# Patient Record
Sex: Female | Born: 1955 | Race: White | Hispanic: No | Marital: Married | Smoking: Former smoker
Health system: Southern US, Community
[De-identification: ages and names within clinical notes are randomized; demographics above are authoritative.]

## PROBLEM LIST (undated history)

## (undated) DIAGNOSIS — M810 Age-related osteoporosis without current pathological fracture: Secondary | ICD-10-CM

## (undated) DIAGNOSIS — E7800 Pure hypercholesterolemia, unspecified: Secondary | ICD-10-CM

## (undated) DIAGNOSIS — R0789 Other chest pain: Secondary | ICD-10-CM

## (undated) DIAGNOSIS — I10 Essential (primary) hypertension: Secondary | ICD-10-CM

## (undated) DIAGNOSIS — L280 Lichen simplex chronicus: Secondary | ICD-10-CM

## (undated) DIAGNOSIS — K219 Gastro-esophageal reflux disease without esophagitis: Secondary | ICD-10-CM

## (undated) DIAGNOSIS — M2550 Pain in unspecified joint: Secondary | ICD-10-CM

## (undated) HISTORY — DX: Lichen simplex chronicus: L28.0

## (undated) HISTORY — DX: Age-related osteoporosis without current pathological fracture: M81.0

## (undated) HISTORY — PX: APPENDECTOMY: SHX54

## (undated) HISTORY — PX: TONSILLECTOMY: SUR1361

## (undated) HISTORY — DX: Pain in unspecified joint: M25.50

## (undated) HISTORY — PX: CHOLECYSTECTOMY: SHX55

## (undated) HISTORY — DX: Other chest pain: R07.89

---

## 2003-08-12 ENCOUNTER — Emergency Department (HOSPITAL_COMMUNITY): Admission: EM | Admit: 2003-08-12 | Discharge: 2003-08-12 | Payer: Self-pay | Admitting: Emergency Medicine

## 2003-08-20 ENCOUNTER — Ambulatory Visit (HOSPITAL_COMMUNITY): Admission: RE | Admit: 2003-08-20 | Discharge: 2003-08-20 | Payer: Self-pay | Admitting: Internal Medicine

## 2005-02-23 ENCOUNTER — Emergency Department (HOSPITAL_COMMUNITY): Admission: EM | Admit: 2005-02-23 | Discharge: 2005-02-23 | Payer: Self-pay | Admitting: Emergency Medicine

## 2005-12-07 ENCOUNTER — Other Ambulatory Visit: Admission: RE | Admit: 2005-12-07 | Discharge: 2005-12-07 | Payer: Self-pay | Admitting: Emergency Medicine

## 2007-03-22 ENCOUNTER — Ambulatory Visit (HOSPITAL_COMMUNITY): Admission: RE | Admit: 2007-03-22 | Discharge: 2007-03-22 | Payer: Self-pay | Admitting: Internal Medicine

## 2007-07-28 ENCOUNTER — Emergency Department (HOSPITAL_COMMUNITY): Admission: EM | Admit: 2007-07-28 | Discharge: 2007-07-28 | Payer: Self-pay | Admitting: Emergency Medicine

## 2007-09-06 ENCOUNTER — Ambulatory Visit (HOSPITAL_COMMUNITY): Admission: RE | Admit: 2007-09-06 | Discharge: 2007-09-06 | Payer: Self-pay | Admitting: Internal Medicine

## 2007-12-23 ENCOUNTER — Other Ambulatory Visit: Admission: RE | Admit: 2007-12-23 | Discharge: 2007-12-23 | Payer: Self-pay | Admitting: Internal Medicine

## 2008-06-03 ENCOUNTER — Emergency Department (HOSPITAL_COMMUNITY): Admission: EM | Admit: 2008-06-03 | Discharge: 2008-06-03 | Payer: Self-pay | Admitting: Emergency Medicine

## 2008-12-15 ENCOUNTER — Emergency Department (HOSPITAL_COMMUNITY): Admission: EM | Admit: 2008-12-15 | Discharge: 2008-12-15 | Payer: Self-pay | Admitting: Emergency Medicine

## 2008-12-16 ENCOUNTER — Ambulatory Visit: Payer: Self-pay | Admitting: Internal Medicine

## 2008-12-16 ENCOUNTER — Inpatient Hospital Stay (HOSPITAL_COMMUNITY): Admission: EM | Admit: 2008-12-16 | Discharge: 2008-12-18 | Payer: Self-pay | Admitting: Emergency Medicine

## 2008-12-22 ENCOUNTER — Emergency Department (HOSPITAL_COMMUNITY): Admission: EM | Admit: 2008-12-22 | Discharge: 2008-12-22 | Payer: Self-pay | Admitting: Emergency Medicine

## 2008-12-25 ENCOUNTER — Encounter: Payer: Self-pay | Admitting: Physician Assistant

## 2008-12-29 ENCOUNTER — Ambulatory Visit: Payer: Self-pay | Admitting: Physician Assistant

## 2008-12-29 DIAGNOSIS — I10 Essential (primary) hypertension: Secondary | ICD-10-CM | POA: Insufficient documentation

## 2008-12-29 DIAGNOSIS — K219 Gastro-esophageal reflux disease without esophagitis: Secondary | ICD-10-CM | POA: Insufficient documentation

## 2008-12-29 DIAGNOSIS — E785 Hyperlipidemia, unspecified: Secondary | ICD-10-CM | POA: Insufficient documentation

## 2008-12-29 DIAGNOSIS — Z87891 Personal history of nicotine dependence: Secondary | ICD-10-CM | POA: Insufficient documentation

## 2009-01-01 ENCOUNTER — Ambulatory Visit (HOSPITAL_COMMUNITY): Admission: RE | Admit: 2009-01-01 | Discharge: 2009-01-01 | Payer: Self-pay | Admitting: Internal Medicine

## 2009-01-06 ENCOUNTER — Encounter: Payer: Self-pay | Admitting: Physician Assistant

## 2009-01-08 ENCOUNTER — Telehealth: Payer: Self-pay | Admitting: Physician Assistant

## 2009-02-12 ENCOUNTER — Encounter: Payer: Self-pay | Admitting: Physician Assistant

## 2009-02-12 DIAGNOSIS — Z8669 Personal history of other diseases of the nervous system and sense organs: Secondary | ICD-10-CM | POA: Insufficient documentation

## 2009-02-13 ENCOUNTER — Emergency Department (HOSPITAL_COMMUNITY): Admission: EM | Admit: 2009-02-13 | Discharge: 2009-02-13 | Payer: Self-pay | Admitting: Family Medicine

## 2009-02-15 ENCOUNTER — Encounter: Payer: Self-pay | Admitting: Physician Assistant

## 2009-02-17 ENCOUNTER — Encounter: Payer: Self-pay | Admitting: Physician Assistant

## 2009-02-17 ENCOUNTER — Ambulatory Visit: Payer: Self-pay | Admitting: Internal Medicine

## 2009-02-19 ENCOUNTER — Encounter: Payer: Self-pay | Admitting: Physician Assistant

## 2009-02-22 LAB — CONVERTED CEMR LAB
ALT: 33 units/L (ref 0–35)
AST: 22 units/L (ref 0–37)
BUN: 6 mg/dL (ref 6–23)
Cholesterol: 189 mg/dL (ref 0–200)
Creatinine, Ser: 0.81 mg/dL (ref 0.40–1.20)
HDL: 57 mg/dL (ref >39)
Total Bilirubin: 0.7 mg/dL (ref 0.3–1.2)
Total CHOL/HDL Ratio: 3.3
VLDL: 32 mg/dL (ref 0–40)
Vit D, 25-Hydroxy: 16 ng/mL — ABNORMAL LOW (ref 30–89)

## 2009-02-23 ENCOUNTER — Telehealth: Payer: Self-pay | Admitting: Physician Assistant

## 2009-02-23 ENCOUNTER — Ambulatory Visit: Payer: Self-pay | Admitting: Physician Assistant

## 2009-02-23 DIAGNOSIS — J309 Allergic rhinitis, unspecified: Secondary | ICD-10-CM | POA: Insufficient documentation

## 2009-02-23 DIAGNOSIS — K589 Irritable bowel syndrome without diarrhea: Secondary | ICD-10-CM | POA: Insufficient documentation

## 2009-02-23 DIAGNOSIS — E559 Vitamin D deficiency, unspecified: Secondary | ICD-10-CM | POA: Insufficient documentation

## 2009-02-23 DIAGNOSIS — F32A Depression, unspecified: Secondary | ICD-10-CM | POA: Insufficient documentation

## 2009-02-23 DIAGNOSIS — F329 Major depressive disorder, single episode, unspecified: Secondary | ICD-10-CM | POA: Insufficient documentation

## 2009-02-23 DIAGNOSIS — K5900 Constipation, unspecified: Secondary | ICD-10-CM | POA: Insufficient documentation

## 2009-02-23 DIAGNOSIS — F3289 Other specified depressive episodes: Secondary | ICD-10-CM | POA: Insufficient documentation

## 2009-02-25 ENCOUNTER — Ambulatory Visit: Payer: Self-pay | Admitting: *Deleted

## 2009-03-03 ENCOUNTER — Telehealth: Payer: Self-pay | Admitting: Physician Assistant

## 2009-03-08 ENCOUNTER — Encounter: Payer: Self-pay | Admitting: Physician Assistant

## 2009-03-09 ENCOUNTER — Telehealth: Payer: Self-pay | Admitting: Physician Assistant

## 2009-03-10 ENCOUNTER — Ambulatory Visit: Payer: Self-pay | Admitting: Physician Assistant

## 2009-03-10 LAB — CONVERTED CEMR LAB
BUN: 9 mg/dL (ref 6–23)
CO2: 25 meq/L (ref 19–32)
Calcium: 9.5 mg/dL (ref 8.4–10.5)
Chloride: 97 meq/L (ref 96–112)
Creatinine, Ser: 1.03 mg/dL (ref 0.40–1.20)

## 2009-03-11 ENCOUNTER — Encounter: Payer: Self-pay | Admitting: Physician Assistant

## 2009-03-15 ENCOUNTER — Encounter: Admission: RE | Admit: 2009-03-15 | Discharge: 2009-05-12 | Payer: Self-pay | Admitting: Physician Assistant

## 2009-03-15 ENCOUNTER — Encounter: Payer: Self-pay | Admitting: Physician Assistant

## 2009-03-29 ENCOUNTER — Ambulatory Visit: Payer: Self-pay | Admitting: Physician Assistant

## 2009-03-29 DIAGNOSIS — L570 Actinic keratosis: Secondary | ICD-10-CM | POA: Insufficient documentation

## 2009-03-29 LAB — CONVERTED CEMR LAB
Cholesterol, target level: 200 mg/dL
HDL goal, serum: 40 mg/dL
LDL Goal: 160 mg/dL

## 2009-04-12 ENCOUNTER — Telehealth: Payer: Self-pay | Admitting: Physician Assistant

## 2009-04-14 ENCOUNTER — Ambulatory Visit (HOSPITAL_COMMUNITY): Admission: RE | Admit: 2009-04-14 | Discharge: 2009-04-14 | Payer: Self-pay | Admitting: Gastroenterology

## 2009-04-16 ENCOUNTER — Encounter: Payer: Self-pay | Admitting: Physician Assistant

## 2009-04-20 ENCOUNTER — Ambulatory Visit: Payer: Self-pay | Admitting: Physician Assistant

## 2009-05-22 ENCOUNTER — Emergency Department (HOSPITAL_COMMUNITY): Admission: EM | Admit: 2009-05-22 | Discharge: 2009-05-22 | Payer: Self-pay | Admitting: Family Medicine

## 2009-05-26 ENCOUNTER — Telehealth: Payer: Self-pay | Admitting: Physician Assistant

## 2009-05-27 ENCOUNTER — Ambulatory Visit: Payer: Self-pay | Admitting: Physician Assistant

## 2009-05-27 DIAGNOSIS — J209 Acute bronchitis, unspecified: Secondary | ICD-10-CM | POA: Insufficient documentation

## 2009-06-01 ENCOUNTER — Telehealth: Payer: Self-pay | Admitting: Physician Assistant

## 2009-06-07 ENCOUNTER — Encounter: Payer: Self-pay | Admitting: Physician Assistant

## 2009-06-10 ENCOUNTER — Ambulatory Visit: Payer: Self-pay | Admitting: Physician Assistant

## 2009-06-10 DIAGNOSIS — R635 Abnormal weight gain: Secondary | ICD-10-CM | POA: Insufficient documentation

## 2009-06-10 DIAGNOSIS — J0110 Acute frontal sinusitis, unspecified: Secondary | ICD-10-CM | POA: Insufficient documentation

## 2009-06-11 ENCOUNTER — Telehealth: Payer: Self-pay | Admitting: Physician Assistant

## 2009-06-11 LAB — CONVERTED CEMR LAB
ALT: 14 units/L (ref 0–35)
BUN: 5 mg/dL — ABNORMAL LOW (ref 6–23)
CO2: 23 meq/L (ref 19–32)
Calcium: 9.7 mg/dL (ref 8.4–10.5)
Chloride: 97 meq/L (ref 96–112)
Creatinine, Ser: 0.95 mg/dL (ref 0.40–1.20)
Glucose, Bld: 107 mg/dL — ABNORMAL HIGH (ref 70–99)
TSH: 1.693 microintl units/mL (ref 0.350–4.500)
Total Bilirubin: 0.3 mg/dL (ref 0.3–1.2)
Vit D, 25-Hydroxy: 17 ng/mL — ABNORMAL LOW (ref 30–89)

## 2009-07-21 ENCOUNTER — Ambulatory Visit: Payer: Self-pay | Admitting: Physician Assistant

## 2009-07-21 DIAGNOSIS — R609 Edema, unspecified: Secondary | ICD-10-CM | POA: Insufficient documentation

## 2009-07-21 DIAGNOSIS — G4700 Insomnia, unspecified: Secondary | ICD-10-CM | POA: Insufficient documentation

## 2009-07-23 ENCOUNTER — Encounter: Payer: Self-pay | Admitting: Physician Assistant

## 2009-07-26 ENCOUNTER — Encounter: Payer: Self-pay | Admitting: Physician Assistant

## 2009-07-27 ENCOUNTER — Telehealth: Payer: Self-pay | Admitting: Physician Assistant

## 2009-08-04 ENCOUNTER — Ambulatory Visit: Payer: Self-pay | Admitting: Physician Assistant

## 2009-08-04 LAB — CONVERTED CEMR LAB
BUN: 6 mg/dL (ref 6–23)
Chloride: 96 meq/L (ref 96–112)
Potassium: 4.5 meq/L (ref 3.5–5.3)

## 2009-08-05 ENCOUNTER — Encounter: Payer: Self-pay | Admitting: Physician Assistant

## 2009-09-23 ENCOUNTER — Ambulatory Visit: Payer: Self-pay | Admitting: Physician Assistant

## 2009-09-23 DIAGNOSIS — J069 Acute upper respiratory infection, unspecified: Secondary | ICD-10-CM | POA: Insufficient documentation

## 2009-09-23 DIAGNOSIS — R0602 Shortness of breath: Secondary | ICD-10-CM | POA: Insufficient documentation

## 2009-09-23 DIAGNOSIS — L219 Seborrheic dermatitis, unspecified: Secondary | ICD-10-CM | POA: Insufficient documentation

## 2009-09-24 LAB — CONVERTED CEMR LAB
ALT: 28 units/L (ref 0–35)
Albumin: 4.3 g/dL (ref 3.5–5.2)
CO2: 26 meq/L (ref 19–32)
Calcium: 9.3 mg/dL (ref 8.4–10.5)
Chloride: 94 meq/L — ABNORMAL LOW (ref 96–112)
Glucose, Bld: 80 mg/dL (ref 70–99)
Hemoglobin: 12.3 g/dL (ref 12.0–15.0)
Lymphs Abs: 2.1 10*3/uL (ref 0.7–4.0)
MCV: 84 fL (ref 78.0–100.0)
Monocytes Absolute: 0.7 10*3/uL (ref 0.1–1.0)
Monocytes Relative: 9 % (ref 3–12)
Neutro Abs: 5 10*3/uL (ref 1.7–7.7)
Neutrophils Relative %: 63 % (ref 43–77)
Pro B Natriuretic peptide (BNP): 30.8 pg/mL (ref 0.0–100.0)
RBC: 4.37 M/uL (ref 3.87–5.11)
Sodium: 132 meq/L — ABNORMAL LOW (ref 135–145)
Total Protein: 6.7 g/dL (ref 6.0–8.3)
WBC: 8 10*3/uL (ref 4.0–10.5)

## 2009-09-27 ENCOUNTER — Ambulatory Visit: Payer: Self-pay | Admitting: Physician Assistant

## 2009-09-30 LAB — CONVERTED CEMR LAB
Calcium: 9.2 mg/dL (ref 8.4–10.5)
Glucose, Bld: 107 mg/dL — ABNORMAL HIGH (ref 70–99)
Osmolality, Ur: 269 mOsm/kg — ABNORMAL LOW (ref 390–1090)
Osmolality: 278 mOsm/kg (ref 275–300)
Sodium, Ur: 60 meq/L
Sodium: 136 meq/L (ref 135–145)

## 2009-10-01 ENCOUNTER — Encounter: Payer: Self-pay | Admitting: Physician Assistant

## 2009-10-03 DIAGNOSIS — Z862 Personal history of diseases of the blood and blood-forming organs and certain disorders involving the immune mechanism: Secondary | ICD-10-CM | POA: Insufficient documentation

## 2009-10-03 DIAGNOSIS — Z8639 Personal history of other endocrine, nutritional and metabolic disease: Secondary | ICD-10-CM

## 2009-10-06 ENCOUNTER — Ambulatory Visit (HOSPITAL_COMMUNITY): Admission: RE | Admit: 2009-10-06 | Discharge: 2009-10-06 | Payer: Self-pay | Admitting: Internal Medicine

## 2009-10-06 ENCOUNTER — Encounter (INDEPENDENT_AMBULATORY_CARE_PROVIDER_SITE_OTHER): Payer: Self-pay | Admitting: Internal Medicine

## 2009-10-06 ENCOUNTER — Ambulatory Visit: Payer: Self-pay | Admitting: Internal Medicine

## 2009-10-06 ENCOUNTER — Encounter: Payer: Self-pay | Admitting: Physician Assistant

## 2009-10-06 LAB — CONVERTED CEMR LAB
Nitrite: NEGATIVE
Specific Gravity, Urine: <1.005
WBC Urine, dipstick: NEGATIVE
pH: 6

## 2009-10-14 ENCOUNTER — Ambulatory Visit (HOSPITAL_COMMUNITY): Admission: RE | Admit: 2009-10-14 | Discharge: 2009-10-14 | Payer: Self-pay | Admitting: Internal Medicine

## 2009-10-20 ENCOUNTER — Encounter: Payer: Self-pay | Admitting: Physician Assistant

## 2009-10-20 ENCOUNTER — Telehealth: Payer: Self-pay | Admitting: Physician Assistant

## 2009-10-20 DIAGNOSIS — K8020 Calculus of gallbladder without cholecystitis without obstruction: Secondary | ICD-10-CM | POA: Insufficient documentation

## 2009-10-22 ENCOUNTER — Telehealth: Payer: Self-pay | Admitting: Physician Assistant

## 2009-11-02 ENCOUNTER — Encounter (INDEPENDENT_AMBULATORY_CARE_PROVIDER_SITE_OTHER): Payer: Self-pay | Admitting: General Surgery

## 2009-11-02 ENCOUNTER — Ambulatory Visit (HOSPITAL_COMMUNITY): Admission: RE | Admit: 2009-11-02 | Discharge: 2009-11-03 | Payer: Self-pay | Admitting: General Surgery

## 2009-11-04 ENCOUNTER — Telehealth: Payer: Self-pay | Admitting: Physician Assistant

## 2009-11-04 ENCOUNTER — Emergency Department (HOSPITAL_COMMUNITY): Admission: EM | Admit: 2009-11-04 | Discharge: 2009-11-04 | Payer: Self-pay | Admitting: Family Medicine

## 2009-12-08 ENCOUNTER — Ambulatory Visit: Payer: Self-pay | Admitting: Physician Assistant

## 2009-12-08 DIAGNOSIS — T753XXA Motion sickness, initial encounter: Secondary | ICD-10-CM | POA: Insufficient documentation

## 2009-12-08 DIAGNOSIS — Z9089 Acquired absence of other organs: Secondary | ICD-10-CM | POA: Insufficient documentation

## 2009-12-23 ENCOUNTER — Ambulatory Visit: Payer: Self-pay | Admitting: Physician Assistant

## 2009-12-23 LAB — CONVERTED CEMR LAB: Total CHOL/HDL Ratio: 4.6

## 2009-12-24 ENCOUNTER — Telehealth: Payer: Self-pay | Admitting: Physician Assistant

## 2010-01-07 ENCOUNTER — Telehealth: Payer: Self-pay | Admitting: Physician Assistant

## 2010-01-26 ENCOUNTER — Ambulatory Visit: Payer: Self-pay | Admitting: Internal Medicine

## 2010-01-26 ENCOUNTER — Telehealth: Payer: Self-pay | Admitting: Physician Assistant

## 2010-01-26 ENCOUNTER — Encounter: Payer: Self-pay | Admitting: Physician Assistant

## 2010-01-26 DIAGNOSIS — R059 Cough, unspecified: Secondary | ICD-10-CM | POA: Insufficient documentation

## 2010-01-26 DIAGNOSIS — R1110 Vomiting, unspecified: Secondary | ICD-10-CM | POA: Insufficient documentation

## 2010-01-26 DIAGNOSIS — R112 Nausea with vomiting, unspecified: Secondary | ICD-10-CM | POA: Insufficient documentation

## 2010-01-26 DIAGNOSIS — R21 Rash and other nonspecific skin eruption: Secondary | ICD-10-CM | POA: Insufficient documentation

## 2010-01-26 DIAGNOSIS — R05 Cough: Secondary | ICD-10-CM | POA: Insufficient documentation

## 2010-01-26 LAB — CONVERTED CEMR LAB
Eosinophils Absolute: 0.1 10*3/uL (ref 0.0–0.7)
Lymphs Abs: 1.6 10*3/uL (ref 0.7–4.0)
MCV: 84.3 fL (ref 78.0–100.0)
Monocytes Relative: 9 % (ref 3–12)
Neutrophils Relative %: 67 % (ref 43–77)
RBC: 4.7 M/uL (ref 3.87–5.11)
WBC: 7 10*3/uL (ref 4.0–10.5)

## 2010-01-27 ENCOUNTER — Encounter: Payer: Self-pay | Admitting: Physician Assistant

## 2010-02-11 ENCOUNTER — Emergency Department (HOSPITAL_COMMUNITY): Admission: EM | Admit: 2010-02-11 | Discharge: 2010-02-11 | Payer: Self-pay | Admitting: Family Medicine

## 2010-02-14 ENCOUNTER — Telehealth: Payer: Self-pay | Admitting: Physician Assistant

## 2010-02-15 DIAGNOSIS — S82409A Unspecified fracture of shaft of unspecified fibula, initial encounter for closed fracture: Secondary | ICD-10-CM | POA: Insufficient documentation

## 2010-02-17 ENCOUNTER — Emergency Department (HOSPITAL_COMMUNITY): Admission: EM | Admit: 2010-02-17 | Discharge: 2010-02-17 | Payer: Self-pay | Admitting: Emergency Medicine

## 2010-03-17 ENCOUNTER — Encounter: Payer: Self-pay | Admitting: Physician Assistant

## 2010-04-28 ENCOUNTER — Ambulatory Visit: Payer: Self-pay | Admitting: Physician Assistant

## 2010-06-05 ENCOUNTER — Encounter: Payer: Self-pay | Admitting: Internal Medicine

## 2010-06-12 LAB — CONVERTED CEMR LAB
Bilirubin Urine: NEGATIVE
Chlamydia, DNA Probe: NEGATIVE
GGT: 88 units/L — ABNORMAL HIGH (ref 7–51)
Glucose, Urine, Semiquant: NEGATIVE
KOH Prep: NEGATIVE
LDL Cholesterol: 118 mg/dL — ABNORMAL HIGH (ref 0–99)
Pap Smear: NEGATIVE
Protein, U semiquant: NEGATIVE
Specific Gravity, Urine: <1.005
Triglycerides: 191 mg/dL — ABNORMAL HIGH (ref <150)
VLDL: 38 mg/dL (ref 0–40)
WBC Urine, dipstick: NEGATIVE
Whiff Test: NEGATIVE
pH: 7

## 2010-06-16 NOTE — Miscellaneous (Signed)
Summary: INITIAL SUMMARY  INITIAL SUMMARY   Imported By: Arta Bruce 06/28/2009 16:49:35  _____________________________________________________________________  External Attachment:    Type:   Image     Comment:   External Document

## 2010-06-16 NOTE — Progress Notes (Signed)
  Phone Note Call from Patient Call back at [facility] Phone [phone] Call back at [phone]   Summary of Call: The pt do not have any clue which medicatioon that she got on Thursday make her sick. She had antiiotics, cold medication and inhaler meds.  Please call her back. Isidor Bromell PA-C  Initial call taken by: Manon Hilding,  June 01, 2009 12:03 PM  Follow-up for Phone Call        Pt called back and says it seems like when she takes her antibx she feels nauseated, but does not actually vomit.  She is taking it on a full stomach.  She only has one day left of the antibx.  She also had some promethazine left and she took one of those and it helped.  She has one more pill of promethazine left I told her to go ahead and take the last one of those with her antibx tomorrow and if still has nausea after fininishing to let us know. Follow-up by: Vesta Mixer CMA,  June 01, 2009 3:52 PM  Additional Follow-up for Phone Call Additional follow up Details #1::        Thanks Additional Follow-up by: Tereso Newcomer PA-C,  June 02, 2009 1:40 PM

## 2010-06-16 NOTE — Assessment & Plan Note (Signed)
Summary: VOMITTING, POSS SINUS INFECTION, COUGH/LR   Vital Signs:  Patient profile:   55 year old female Height:      62 inches Weight:      181 pounds BMI:     33.22 Temp:     97.0 degrees F oral Pulse rate:   76 / minute Pulse rhythm:   regular Resp:     18 per minute BP sitting:   100 / 70  (left arm) Cuff size:   regular  Vitals Entered By: Armenia Shannon (January 26, 2010 10:37 AM) CC: pt says she has been vomiting and coughing since yesterday.... pt says she took a med for dizzness and woke up with her face broke out.... Is Patient Diabetic? No Pain Assessment Patient in pain? no       Does patient need assistance? Functional Status Self care Ambulation Normal   Primary Care Provider:  Tereso Newcomer PA-C  CC:  pt says she has been vomiting and coughing since yesterday.... pt says she took a med for dizzness and woke up with her face broke out.....  History of Present Illness: he is a 55 year old female presents with one-day of nausea and vomiting.  She's had some cough as well.  She has she's has had a cough for a while.  She has a lot of nasal drainage.  She has some congestion.  She denies fever or chills.  Her grandson was sick recently.she denies ear pain or sore throat.  She denies diarrhea.  She denies any abdominal discomfort.  She took some meclizine for some dizziness yesterday.  She now has a rash on her face.  She was dry heaving quite a bit.  She denies syncope.  She denies lightheadedness.  She denies shortness of breath.  She does continue to have a lot of wheezing.  Problems Prior to Update: 1)  Facial Rash  (ICD-782.1) 2)  Cough  (ICD-786.2) 3)  Vomiting  (ICD-787.03) 4)  Cholecystectomy, Laparoscopic, Hx of  (ICD-V45.79) 5)  Motion Sickness  (ICD-994.6) 6)  Cholelithiasis  (ICD-574.20) 7)  Liver Function Tests, Abnormal, Hx of  (ICD-V12.2) 8)  Viral Uri  (ICD-465.9) 9)  Shortness of Breath  (ICD-786.05) 10)  Seborrhea  (ICD-706.3) 11)  Edema   (ICD-782.3) 12)  Insomnia  (ICD-780.52) 13)  Weight Gain  (ICD-783.1) 14)  Acute Frontal Sinusitis  (ICD-461.1) 15)  Acute Bronchitis  (ICD-466.0) 16)  Actinic Keratosis  (ICD-702.0) 17)  Vitamin D Deficiency  (ICD-268.9) 18)  Allergic Rhinitis  (ICD-477.9) 19)  Depression  (ICD-311) 20)  Irritable Bowel Syndrome  (ICD-564.1) 21)  Constipation  (ICD-564.00) 22)  Benign Positional Vertigo, Hx of  (ICD-V12.49) 23)  Tobacco Use, Quit  (ICD-V15.82) 24)  Preventive Health Care  (ICD-V70.0) 25)  Gerd  (ICD-530.81) 26)  Dyslipidemia  (ICD-272.4) 27)  Essential Hypertension, Benign  (ICD-401.1)  Current Medications (verified): 1)  Protonix 40 Mg Tbec (Pantoprazole Sodium) .... Take 1 Tablet By Mouth Once Daily 2)  Simvastatin 40 Mg Tabs (Simvastatin) .... Take 1 Tab By Mouth At Bedtime For Cholesterol 3)  Metoprolol Tartrate 25 Mg Tabs (Metoprolol Tartrate) .... One Tab Twice By Mouth Every Day 4)  Vitamin D3 400 Unit Tabs (Cholecalciferol) .... Take 2 Tablets By Mouth Once Daily.  Do Not Fill For 3 Months (Patient To Complete 12 Weeks of Vitamin D2 First). 5)  Meclizine Hcl 25 Mg Tabs (Meclizine Hcl) .... Take One Tablet By Mouth Every 6-8 Hours As Needed For Dizziness *d Lorenz Coaster, Md 6)  Dulcolax 10 Mg Supp (Bisacodyl) .Marland Kitchen.. 1 Supp Pr Once Daily As Needed For Constipation 7)  Lisinopril-Hydrochlorothiazide 20-25 Mg Tabs (Lisinopril-Hydrochlorothiazide) .... Take 1 Tablet By Mouth Once A Day For Blood Pressure 8)  Zyrtec Allergy 10 Mg Tabs (Cetirizine Hcl) .... Take 1 Tablet By Mouth Once A Day As Needed For Allergies 9)  Promethazine Hcl 25 Mg Tabs (Promethazine Hcl) .Marland Kitchen.. 1 By Mouth Q 8 Hours As Needed For Nausea and Vomiting 10)  Trazodone Hcl 100 Mg Tabs (Trazodone Hcl) .... Take 1 Tab By Mouth At Bedtime As Needed For Sleep 11)  Tessalon Perles 100 Mg Caps (Benzonatate) .... Take 1 Tablet By Mouth Three Times A Day As Needed For Cough 12)  Proventil Hfa 108 (90 Base) Mcg/act Aers (Albuterol  Sulfate) .Marland Kitchen.. 1-2 Puffs Every 4-6 Hours As Needed For Cough or Wheezing 13)  Ergocalciferol 50000 Unit Caps (Ergocalciferol) .Marland Kitchen.. 1 By Mouth Once A Week For 12 Weeks 14)  Promethazine Hcl 25 Mg Tabs (Promethazine Hcl) .... Take 1 By Mouth Every 8 Hours As Needed For Nausea and Vomiting. 15)  Transderm-Scop 1.5 Mg Pt72 (Scopolamine Base) .... Apply Patch Every 3 Days As Needed For Motion Sickness 16)  Trazodone Hcl 50 Mg Tabs (Trazodone Hcl) .... Take 1 Tab By Mouth At Bedtime As Needed For Sleep 17)  Lovaza 1 Gm Caps (Omega-3-Acid Ethyl Esters) .... Take 1 Capsule By Mouth Two Times A Day  Allergies (verified): 1)  ! * Codiene  Past History:  Past Medical History: Last updated: 12/08/2009 Current Problems:  GERD (ICD-530.81) DYSLIPIDEMIA (ICD-272.4) ESSENTIAL HYPERTENSION, BENIGN (ICD-401.1) Normal Coronaries by Cardiac Cath. 12/18/2008 (EF 60%; runs of PACs during procedure - beta blocker recommended) Echo 10/2009: EF 60-65%; Grade 2 Diastolic Dysfunction Irritable Bowel Syndrome Depression Vitamin D Deficiency Benign Paroxysmal Positional Vertigo PFTs 10/06/2009:  normal airflows; no response to bronchodilator (FEV1 93%; FEV1/FVC 81%)  Past Surgical History: Last updated: 12/08/2009 s/p bronchoscopy 05/22/1997 - LN biopsy (benign per patient) s/p appendectomy s/p tonsillectomy s/p right foot surgery  Cholecystectomy (10/2009)  Physical Exam  General:  alert, well-developed, and well-nourished.   Head:  normocephalic and atraumatic.   Eyes:  pupils equal, pupils round, pupils reactive to light, and no injection.   Ears:  R ear normal and L ear normal.   Nose:  nasal dischargemucosal pallor.   Mouth:  + cobblestoning noted post pharynx no exudates  Neck:  no cervical lymphadenopathy.   Lungs:  good air movement slight exp wheezing upper lobes bilat no rales  Heart:  normal rate and regular rhythm.   Neurologic:  alert & oriented X3 and cranial nerves II-XII intact.     Psych:  normally interactive.     Impression & Recommendations:  Problem # 1:  VOMITING (ICD-787.03) suspect increased post nasal drip causing nausea may also have gastroenteriitis clear liquids . Marland Kitchen BRAT. . reg diet wants something different for nausea will give compazine as needed . . . phenergan made her too tired  Problem # 2:  COUGH (ICD-786.2) ? related to ACE change to hyzaar  still has some wheezing add advair. . . samples given  if improvement with advair could continue  add flonase cont zyrtec . Marland Kitchen cant take benadryl for a few days instead if needed  fluids, tylenol as needed  Problem # 3:  FACIAL RASH (ICD-782.1)  looks telangiectactic ? 2/2 wretching observe f/u if no changes check cbc to eval plts  Orders: T-CBC w/Diff ([id number])  Complete Medication List: 1)  Protonix 40 Mg Tbec (Pantoprazole sodium) .... Take 1 tablet by mouth once daily 2)  Simvastatin 40 Mg Tabs (Simvastatin) .... Take 1 tab by mouth at bedtime for cholesterol 3)  Metoprolol Tartrate 25 Mg Tabs (Metoprolol tartrate) .... One tab twice by mouth every day 4)  Vitamin D3 400 Unit Tabs (Cholecalciferol) .... Take 2 tablets by mouth once daily.  do not fill for 3 months (patient to complete 12 weeks of vitamin d2 first). 5)  Meclizine Hcl 25 Mg Tabs (Meclizine hcl) .... Take one tablet by mouth every 6-8 hours as needed for dizziness *d keller, md 6)  Dulcolax 10 Mg Supp (Bisacodyl) .Marland Kitchen.. 1 supp pr once daily as needed for constipation 7)  Lisinopril-hydrochlorothiazide 20-25 Mg Tabs (Lisinopril-hydrochlorothiazide) .... Take 1 tablet by mouth once a day for blood pressure 8)  Zyrtec Allergy 10 Mg Tabs (Cetirizine hcl) .... Take 1 tablet by mouth once a day as needed for allergies 9)  Trazodone Hcl 100 Mg Tabs (Trazodone hcl) .... Take 1 tab by mouth at bedtime as needed for sleep 10)  Tessalon Perles 100 Mg Caps (Benzonatate) .... Take 1 tablet by mouth three times a day as needed for  cough 11)  Proventil Hfa 108 (90 Base) Mcg/act Aers (Albuterol sulfate) .Marland Kitchen.. 1-2 puffs every 4-6 hours as needed for cough or wheezing 12)  Ergocalciferol 50000 Unit Caps (Ergocalciferol) .Marland Kitchen.. 1 by mouth once a week for 12 weeks 13)  Promethazine Hcl 25 Mg Tabs (Promethazine hcl) .... Take 1 by mouth every 8 hours as needed for nausea and vomiting. 14)  Transderm-scop 1.5 Mg Pt72 (Scopolamine base) .... Apply patch every 3 days as needed for motion sickness 15)  Trazodone Hcl 50 Mg Tabs (Trazodone hcl) .... Take 1 tab by mouth at bedtime as needed for sleep 16)  Lovaza 1 Gm Caps (Omega-3-acid ethyl esters) .... Take 1 capsule by mouth two times a day 17)  Prochlorperazine Maleate 5 Mg Tabs (Prochlorperazine maleate) .... Take 1 by mouth every 8-12 hours as needed nausea 18)  Advair Diskus 100-50 Mcg/dose Aepb (Fluticasone-salmeterol) .Marland Kitchen.. 1 inhalation two times a day 19)  Flonase 50 Mcg/act Susp (Fluticasone propionate) .... 2 sprays each nostril once daily 20)  Hyzaar 50-12.5 Mg Tabs (Losartan potassium-hctz) .... Take 1 tablet by mouth once a day for blood pressure (pharmacy: lisinopril has been discontinued)  Patient Instructions: 1)  Keep using proventil as needed. 2)  Use the Advair two times a day.  Make sure you rinse your mouth out well after each use. 3)  The samples are good for one week at a time.   4)  If you like the Advair was more helpful than not, call and I will give you a prescription. 5)  You can use benadryl 25 mg every 6 hours instead of zyrtec if needed. 6)  Use the flonase daily for congestion and to slow down drainage. 7)  Use the compazine as needed for nausea. 8)  Watch the rash on your face.  If it changes or gets worse, I want to see you back sooner. 9)  Stop the Lisinopril/HCTZ. 10)  Start the Hyzaar.  This will not make you cough. 11)  Schedule follow up with Scott in 3-4 weeks. Prescriptions: HYZAAR 50-12.5 MG TABS (LOSARTAN POTASSIUM-HCTZ) Take 1 tablet by  mouth once a day for blood pressure (pharmacy: Lisinopril has been discontinued)  #30 x 5   Entered and Authorized by:   Tereso Newcomer PA-C   Signed by:  Tereso Newcomer PA-C on 01/26/2010   Method used:   Print then Give to Patient   RxID:   [id number] FLONASE 50 MCG/ACT SUSP (FLUTICASONE PROPIONATE) 2 sprays each nostril once daily  #1 x 3   Entered and Authorized by:   Tereso Newcomer PA-C   Signed by:   Tereso Newcomer PA-C on 01/26/2010   Method used:   Print then Give to Patient   RxID:   [id number] PROCHLORPERAZINE MALEATE 5 MG TABS (PROCHLORPERAZINE MALEATE) take 1 by mouth every 8-12 hours as needed nausea  #20 x 0   Entered and Authorized by:   Tereso Newcomer PA-C   Signed by:   Tereso Newcomer PA-C on 01/26/2010   Method used:   Print then Give to Patient   RxID:   [id number]

## 2010-06-16 NOTE — Letter (Signed)
Summary: PT REQUESTING RECORDS FOR SELF  PT REQUESTING RECORDS FOR SELF   Imported By: Arta Bruce 03/17/2010 14:16:55  _____________________________________________________________________  External Attachment:    Type:   Image     Comment:   External Document

## 2010-06-16 NOTE — Letter (Signed)
Summary: ECHO  ECHO   Imported By: Arta Bruce 10/22/2009 11:11:16  _____________________________________________________________________  External Attachment:    Type:   Image     Comment:   External Document

## 2010-06-16 NOTE — Progress Notes (Signed)
  Phone Note Outgoing Call   Summary of Call: Needs referral to surgeon for poss. symptomatic gallstones.  Referral letter in system. Initial call taken by: Tereso Newcomer PA-C,  October 20, 2009 3:17 PM

## 2010-06-16 NOTE — Letter (Signed)
Summary: *HSN Results Follow up  [facility]  [address] [place], [place] [address]   Phone: [phone]  Fax: [phone]      08/05/2009   [name] [address] [place], [place]  [address]  [place]   Dear  [name],                            ____S.Drinkard,FNP   ____D. Gore,FNP       ____B. McPherson,MD   ____V. Rankins,MD    ____E. Mulberry,MD    ____N. Daphine Deutscher, FNP  ____D. Reche Dixon, MD    ____K. Philipp Deputy, MD    __x__S. Alben Spittle, PA-C     This letter is to inform you that your recent test(s):  _______Pap Smear    ___x____Lab Test     _______X-ray    ___x____ is within acceptable limits  _______ requires a medication change  _______ requires a follow-up lab visit  _______ requires a follow-up visit with your provider   Comments:       _________________________________________________________ If you have any questions, please contact our office                     Sincerely,  Tereso Newcomer PA-C [facility]

## 2010-06-16 NOTE — Progress Notes (Signed)
Summary: Left Fibular Fracture  Phone Note Call from Patient Call back at [phone] Message from:  Patient  Refills Requested: Medication #1:  SIMVASTATIN 40 MG TABS Take 1 tab by mouth at bedtime for cholesterol HEALTH DEPARTMENT/ PT ALSO NEEDS A REFILL ON HER INHALER TOO.   Initial call taken by: Oscar La,  February 14, 2010 8:34 AM Reason for Call: Talk to Nurse, Referral Summary of Call: PT NEEDS A REFERRAL TO AN ORTHROPEDIC DOCTOR THAT CAN SEE HELATH SERVE PT SHE BORKE HER LEG AND URGENT CARE SAID THEY REFERRED HER TO AN ORTHROPEDIC BUT THEY DONT ACCEPT THE ORANGE CARD ANYMORE  Initial call taken by: Oscar La,  February 14, 2010 8:43 AM  Follow-up for Phone Call        pt says her leg is in a cast which is temporal and needs permanent cast Follow-up by: Armenia Shannon,  February 14, 2010 9:13 AM  Additional Follow-up for Phone Call Additional follow up Details #1::        get records from urgent care today and put on my desk Tereso Newcomer PA-C  February 14, 2010 1:38 PM   New Problems: CLOSED FRACTURE OF UNSPECIFIED PART OF FIBULA (ICD-823.81)   Additional Follow-up for Phone Call Additional follow up Details #2::    Patient has a comminuted but not angulated fracture of the fibular diaphsis at the junction of the mid and distal thirds.  Needs referral to ortho, but apparently ortho she was referred to from ED does not see our patients. Please refer to ortho ASAP. Tereso Newcomer PA-C  February 15, 2010 2:13 PM   spoke with pt and she had the cast put on this morning by the referral doctor.Marland KitchenMarland KitchenMarland KitchenArmenia Shannon  February 15, 2010 2:34 PM   New Problems: CLOSED FRACTURE OF UNSPECIFIED PART OF FIBULA (ICD-823.81) Prescriptions: PROVENTIL HFA 108 (90 BASE) MCG/ACT AERS (ALBUTEROL SULFATE) 1-2 puffs every 4-6 hours as needed for cough or wheezing  #1 x 5   Entered and Authorized by:   Tereso Newcomer PA-C   Signed by:   Tereso Newcomer PA-C on 02/14/2010   Method used:   Faxed to  ...       [facility] (retail)       [address] [place], [place]  [address]       Ph: [phone]       Fax: [phone]   RxID:   [id number] SIMVASTATIN 40 MG TABS (SIMVASTATIN) Take 1 tab by mouth at bedtime for cholesterol  #30 x 5   Entered and Authorized by:   Tereso Newcomer PA-C   Signed by:   Tereso Newcomer PA-C on 02/14/2010   Method used:   Faxed to ...       [facility] Department (retail)       [address] [place], [place]  [address]       Ph: [phone]       Fax: [phone]   RxID:   [id number]     Impression & Recommendations:  Problem # 1:  CLOSED FRACTURE OF UNSPECIFIED PART OF FIBULA (ICD-823.81)  needs referral to ortho evaluated in ED 02/11/2010  Orders: Orthopedic Referral (Ortho)  Complete Medication List: 1)  Simvastatin 40 Mg Tabs (Simvastatin) .... Take 1 tab by mouth at bedtime for cholesterol 2)  Metoprolol Tartrate 25 Mg Tabs (Metoprolol tartrate) .... One tab twice  by mouth every day 3)  Vitamin D3 400 Unit Tabs (Cholecalciferol) .... Take 2 tablets by mouth once daily.  do not fill for 3 months (patient to complete 12 weeks of vitamin d2 first). 4)  Meclizine Hcl 25 Mg Tabs (Meclizine hcl) .... Take one tablet by mouth every 6-8 hours as needed for dizziness *d keller, md 5)  Dulcolax 10 Mg Supp (Bisacodyl) .Marland Kitchen.. 1 supp pr once daily as needed for constipation 6)  Zyrtec Allergy 10 Mg Tabs (Cetirizine hcl) .... Take 1 tablet by mouth once a day as needed for allergies 7)  Trazodone Hcl 100 Mg Tabs (Trazodone hcl) .... Take 1 tab by mouth at bedtime as needed for sleep 8)  Tessalon Perles 100 Mg Caps (Benzonatate) .... Take 1 tablet by mouth three times a day as needed for cough 9)  Proventil Hfa 108 (90 Base) Mcg/act Aers (Albuterol sulfate) .Marland Kitchen.. 1-2 puffs every 4-6 hours as needed for cough or wheezing 10)  Ergocalciferol 50000 Unit Caps (Ergocalciferol) .Marland Kitchen.. 1 by mouth once a week for  12 weeks 11)  Promethazine Hcl 25 Mg Tabs (Promethazine hcl) .... Take 1 by mouth every 8 hours as needed for nausea and vomiting. 12)  Transderm-scop 1.5 Mg Pt72 (Scopolamine base) .... Apply patch every 3 days as needed for motion sickness 13)  Trazodone Hcl 50 Mg Tabs (Trazodone hcl) .... Take 1 tab by mouth at bedtime as needed for sleep 14)  Lovaza 1 Gm Caps (Omega-3-acid ethyl esters) .... Take 1 capsule by mouth two times a day 15)  Prochlorperazine Maleate 5 Mg Tabs (Prochlorperazine maleate) .... Take 1 by mouth every 8-12 hours as needed nausea 16)  Advair Diskus 100-50 Mcg/dose Aepb (Fluticasone-salmeterol) .Marland Kitchen.. 1 inhalation two times a day 17)  Nasacort Aq 55 Mcg/act Aers (Triamcinolone acetonide) .Marland Kitchen.. 1-2 sprays each nostril once daily 18)  Hyzaar 50-12.5 Mg Tabs (Losartan potassium-hctz) .... Take 1 tablet by mouth once a day for blood pressure (pharmacy: lisinopril has been discontinued) 19)  Protonix 40 Mg Tbec (Pantoprazole sodium) .... Take 1 tablet by mouth once daily

## 2010-06-16 NOTE — Progress Notes (Signed)
Summary: cholesterol follow up  Phone Note Outgoing Call   Summary of Call: Trigs too high. Make sure she is taking Simvastatin every day. Add fish oil  Rx sent to [facility]. Repeat FLP and LFTs in 3 mos. Watch saturated fats, etc. Initial call taken by: Tereso Newcomer PA-C,  December 24, 2009 11:22 PM  Follow-up for Phone Call        pt is aware and would like to buy OTC .Marland Kitchen. she thinks she can get it cheaper Follow-up by: Armenia Shannon,  December 27, 2009 2:48 PM    New/Updated Medications: LOVAZA 1 GM CAPS (OMEGA-3-ACID ETHYL ESTERS) Take 1 capsule by mouth two times a day Prescriptions: LOVAZA 1 GM CAPS (OMEGA-3-ACID ETHYL ESTERS) Take 1 capsule by mouth two times a day  #60 x 5   Entered and Authorized by:   Tereso Newcomer PA-C   Signed by:   Tereso Newcomer PA-C on 12/24/2009   Method used:   Faxed to ...       [facility] (retail)       [address] [place], [place]  [address]       Ph: [phone]       Fax: [phone]   RxID:   [id number]      Impression & Recommendations:  Problem # 1:  DYSLIPIDEMIA (ICD-272.4)  Her updated medication list for this problem includes:    Simvastatin 40 Mg Tabs (Simvastatin) .Marland Kitchen... Take 1 tab by mouth at bedtime for cholesterol    Lovaza 1 Gm Caps (Omega-3-acid ethyl esters) .Marland Kitchen... Take 1 capsule by mouth two times a day  Complete Medication List: 1)  Protonix 40 Mg Tbec (Pantoprazole sodium) .... Take 1 tablet by mouth once daily 2)  Simvastatin 40 Mg Tabs (Simvastatin) .... Take 1 tab by mouth at bedtime for cholesterol 3)  Metoprolol Tartrate 25 Mg Tabs (Metoprolol tartrate) .... One tab twice by mouth every day 4)  Vitamin D3 400 Unit Tabs (Cholecalciferol) .... Take 2 tablets by mouth once daily.  do not fill for 3 months (patient to complete 12 weeks of vitamin d2 first). 5)  Meclizine Hcl 25 Mg Tabs (Meclizine hcl) .... Take one tablet by mouth every 6-8 hours as needed for  dizziness *d keller, md 6)  Dulcolax 10 Mg Supp (Bisacodyl) .Marland Kitchen.. 1 supp pr once daily as needed for constipation 7)  Lisinopril-hydrochlorothiazide 20-25 Mg Tabs (Lisinopril-hydrochlorothiazide) .... Take 1 tablet by mouth once a day for blood pressure 8)  Zyrtec Allergy 10 Mg Tabs (Cetirizine hcl) .... Take 1 tablet by mouth once a day as needed for allergies 9)  Promethazine Hcl 25 Mg Tabs (Promethazine hcl) .Marland Kitchen.. 1 by mouth q 8 hours as needed for nausea and vomiting 10)  Trazodone Hcl 100 Mg Tabs (Trazodone hcl) .... Take 1 tab by mouth at bedtime as needed for sleep 11)  Tessalon Perles 100 Mg Caps (Benzonatate) .... Take 1 tablet by mouth three times a day as needed for cough 12)  Proventil Hfa 108 (90 Base) Mcg/act Aers (Albuterol sulfate) .Marland Kitchen.. 1-2 puffs every 4-6 hours as needed for cough or wheezing 13)  Ergocalciferol 50000 Unit Caps (Ergocalciferol) .Marland Kitchen.. 1 by mouth once a week for 12 weeks 14)  Promethazine Hcl 25 Mg Tabs (Promethazine hcl) .... Take 1 by mouth every 8 hours as needed for nausea and vomiting. 15)  Transderm-scop 1.5 Mg Pt72 (Scopolamine base) .... Apply patch every 3 days as  needed for motion sickness 16)  Trazodone Hcl 50 Mg Tabs (Trazodone hcl) .... Take 1 tab by mouth at bedtime as needed for sleep 17)  Lovaza 1 Gm Caps (Omega-3-acid ethyl esters) .... Take 1 capsule by mouth two times a day

## 2010-06-16 NOTE — Progress Notes (Signed)
  Phone Note Call from Patient Call back at [facility] Phone [phone]   Summary of Call: The pt dropped off the prescriptiion to the [facility] but seem that they lost the prescription and she couldn't get the new prescription for simvastatin that was changed from 20 to 40 mg.  Also, she needs the new prescription for the fluid pills ( pt do not remember the name but also the prescription was changed from the provider too).  Please call her back as soon as you read this message. Alben Spittle PA-c Initial call taken by: Manon Hilding,  July 27, 2009 10:39 AM  Follow-up for Phone Call        called meds into pharmacy Follow-up by: Arm[place] Shannon,  July 28, 2009 4:10 PM

## 2010-06-16 NOTE — Progress Notes (Signed)
Summary: Office Visit//DEPRESSION SCREENING  Office Visit//DEPRESSION SCREENING   Imported By: Arta Bruce 09/22/2009 14:38:25  _____________________________________________________________________  External Attachment:    Type:   Image     Comment:   External Document

## 2010-06-16 NOTE — Progress Notes (Signed)
  Phone Note Call from Patient Call back at Home Phone [phone] Call back at [phone]   Summary of Call: Since last Friday pt had been sick ( sore throat, ear pain, and coughing) and she doesn't know if the provider can call in something for her at the pharmacy.  [facility] or [facility] [address] [place] [place] PA-C Initial call taken by: Manon Hilding,  May 26, 2009 4:58 PM  Follow-up for Phone Call        spoke with pt and she said she was sick on Jan. 7 and she came here but it was no opening in your schedule.... pt then went to Urgent care and thats when she was told she viral infection...Marland KitchenMarland Kitchen pt said they did not give her anything to take...Marland KitchenMarland KitchenMarland Kitchen pt says she has been taking robtussin and OTC med for running nose coricidin.. pt denies fever... pt has sore throat and did have throat culture done at urgent which was negative...Marland KitchenMarland KitchenMarland Kitchen pt has yellow mucous coming out her nose and when she coughs... pt says both ears are hurting her.... health dept...Marland KitchenMarland Kitchen pt says if you prescribe something that cost cheap she can afford she will use [facility] on [address]... Follow-up by: [place] Shannon,  May 26, 2009 5:11 PM  Additional Follow-up for Phone Call Additional follow up Details #1::        see tomorrow at 3:45 Additional Follow-up by: Tereso Newcomer PA-C,  May 26, 2009 5:15 PM

## 2010-06-16 NOTE — Progress Notes (Signed)
Summary: c/o legs swelling and nausea  Phone Note Call from Patient Call back at [facility] Phone [phone]   Summary of Call: PT STATES FEELING SICK ON THE STOMACH FOR SOMETIME NOW AND ALSO COMPLAINING OF SWOLLEN ANKLES. Initial call taken by: Hassell Halim CMA,  January 07, 2010 8:58 AM  Follow-up for Phone Call        Been sick on her stomach for about two days now, no vomiting, just sick feeling.  Denies different foods or drink.  Denies related to change in position or motion.  Is not using scopolamine patches at present.  Denies fever or malaise, just stomach.  Is able to drink and eat.  Advised to drink ginger-ale to aid in relief of nausea, to call back if it worsens or vomiting develops.  Swelling to ankles has been going on for awhile, goes down at night, goes up in the afternoon.  Edema goes down when she elevates her legs.  Denies use of carbonated drinks or processed foods.  Denies erythema or pain, just swollen.     Follow-up by: Dutch Quint RN,  January 07, 2010 9:24 AM  Additional Follow-up for Phone Call Additional follow up Details #1::        Work up of LE edema has been benign in the past.  She likely has venous insufficiency.  She needs to start wearing lower ext compression stockings from morning to night to help with swelling.  Keep taking protonix.  IF she develops RUQ pain over the weekend, go to urgent care.  Agree she should call back if symptoms worsen or she develops RUQ pain. Additional Follow-up by: Tereso Newcomer PA-C,  January 07, 2010 2:43 PM    Additional Follow-up for Phone Call Additional follow up Details #2::    Advised pt. of provider's response - states that she needs Rx for compression hose.  Verbalized agreement to recommendations re nausea.  Dutch Quint RN  January 07, 2010 2:48 PM  I suggest she get them over the counter.  She just needs to look for compression stockings in the pharmacy or [facility] or [facility].  Stockings by Rx will be expensive  unless you have Medicare or Insurance to pay. Tereso Newcomer PA-C  January 07, 2010 3:45 PM  Advised of provider's recommendations -- verbalized agreement.  Dutch Quint RN  January 07, 2010 5:17 PM

## 2010-06-16 NOTE — Assessment & Plan Note (Signed)
Summary: OV/PER Gregor Dershem/////KT   Vital Signs:  Patient profile:   55 year old female Height:      62 inches Weight:      172 pounds BMI:     31.57 Temp:     97.7 degrees F oral Pulse rate:   118 / minute Pulse rhythm:   regular Resp:     18 per minute BP sitting:   146 / 95  (left arm) Cuff size:   regular  Vitals Entered By: [place] Shannon (May 27, 2009 3:54 PM)  Primary Care Provider:  Tereso Newcomer PA-C   History of Present Illness: Here for URI. Present x 1 week. + otalgia bilat + sore throat; went to urgent care and throat swab neg per her report + cough; productive; greenish/yellow throughout the day; no hemoptysis + chest congestion and tightness No dyspnea Had flu shot this year. + headache; bilat frontal + nausea; no vomiting or diarrhea no fever; + chills    Hypertension History:      Positive major cardiovascular risk factors include hyperlipidemia and hypertension.  Negative major cardiovascular risk factors include female age less than 1 years old and non-tobacco-user status.     Allergies: 1)  ! * Codiene  Past History:  Past Medical History: Last updated: 02/23/2009 Current Problems:  GERD (ICD-530.81) DYSLIPIDEMIA (ICD-272.4) ESSENTIAL HYPERTENSION, BENIGN (ICD-401.1) Normal Coronaries by Cardiac Cath. 12/18/2008 (EF 60%; runs of PACs during procedure - beta blocker recommended) Irritable Bowel Syndrome Depression Vitamin D Deficiency Benign Paroxysmal Positional Vertigo  Physical Exam  General:  alert, well-developed, and well-nourished.   Head:  normocephalic and atraumatic.   Eyes:  pupils equal, pupils round, and pupils reactive to light.   Ears:  R ear normal and L ear normal.   Nose:  no nasal discharge.   Mouth:  pharynx pink and moist, no erythema, and no exudates.   Neck:  supple and no cervical lymphadenopathy.   Lungs:  normal breath sounds, no crackles, and no wheezes.   Heart:  normal rate and regular rhythm.     Neurologic:  alert & oriented X3 and cranial nerves II-XII intact.   Psych:  normally interactive.     Impression & Recommendations:  Problem # 1:  ACUTE BRONCHITIS (ICD-466.0)  with smoking history and sputum production, will go ahead and place on antibx as well  Her updated medication list for this problem includes:    Doxycycline Hyclate 100 Mg Tabs (Doxycycline hyclate) .Marland Kitchen... Take 1 tablet by mouth two times a day until all gone    Tessalon Perles 100 Mg Caps (Benzonatate) .Marland Kitchen... Take 1 tablet by mouth three times a day as needed for cough    Proventil Hfa 108 (90 Base) Mcg/act Aers (Albuterol sulfate) .Marland Kitchen... 1-2 puffs every 4-6 hours as needed for cough or wheezing  Complete Medication List: 1)  Protonix 40 Mg Tbec (Pantoprazole sodium) .... Take 1 tablet by mouth two times a day 2)  Simvastatin 20 Mg Tabs (Simvastatin) .... One tab bymouth daily 3)  Metoprolol Tartrate 25 Mg Tabs (Metoprolol tartrate) .... One tab twice by mouth every day 4)  Ergocalciferol 50000 Unit Caps (Ergocalciferol) .... Take 1 by mouth once per week for 12 weeks. 5)  Vitamin D3 400 Unit Tabs (Cholecalciferol) .... Take 2 tablets by mouth once daily.  do not fill for 3 months (patient to complete 12 weeks of vitamin d2 first). 6)  Meclizine Hcl 25 Mg Tabs (Meclizine hcl) .... Take one tablet by mouth every  6-8 hours as needed for dizziness *d keller, md 7)  Dulcolax 10 Mg Supp (Bisacodyl) .Marland Kitchen.. 1 supp pr once daily as needed for constipation 8)  Zoloft 50 Mg Tabs (Sertraline hcl) .... 1/2 tab once daily x 1 week, then increase to 1 by mouth once daily 9)  Lisinopril-hydrochlorothiazide 20-12.5 Mg Tabs (Lisinopril-hydrochlorothiazide) .... Take 1 tablet by mouth once a day 10)  Zyrtec Allergy 10 Mg Tabs (Cetirizine hcl) .... Take 1 tablet by mouth once a day as needed for allergies 11)  Promethazine Hcl 25 Mg Tabs (Promethazine hcl) .Marland Kitchen.. 1 by mouth q 8 hours as needed for nausea and vomiting 12)  Trazodone Hcl  50 Mg Tabs (Trazodone hcl) .... Take 1/2 to 1 tablet by mouth at bedtime as needed for sleep 13)  Doxycycline Hyclate 100 Mg Tabs (Doxycycline hyclate) .... Take 1 tablet by mouth two times a day until all gone 14)  Tessalon Perles 100 Mg Caps (Benzonatate) .... Take 1 tablet by mouth three times a day as needed for cough 15)  Proventil Hfa 108 (90 Base) Mcg/act Aers (Albuterol sulfate) .Marland Kitchen.. 1-2 puffs every 4-6 hours as needed for cough or wheezing  Hypertension Assessment/Plan:      The patient's hypertensive risk group is category B: At least one risk factor (excluding diabetes) with no target organ damage.  Her calculated 10 year risk of coronary heart disease is 9 %.  Today's blood pressure is 146/95.  Her blood pressure goal is < 140/90.  Patient Instructions: 1)  Follow up as scheduled. 2)  Return in 7-10 days if no better or sooner if worse.  Go to the ED if you develop a high fever (101 or higher). 3)  Take 650 - 1000 mg of tylenol every 4-6 hours as needed for relief of pain or comfort of fever. Avoid taking more than 4000 mg in a 24 hour period( can cause liver damage in higher doses).  4)  Drink plenty of fluids and get plenty of rest. Prescriptions: PROVENTIL HFA 108 (90 BASE) MCG/ACT AERS (ALBUTEROL SULFATE) 1-2 puffs every 4-6 hours as needed for cough or wheezing  #1 x 0   Entered and Authorized by:   Tereso Newcomer PA-C   Signed by:   Tereso Newcomer PA-C on 05/27/2009   Method used:   Print then Give to Patient   RxID:   [id number] TESSALON PERLES 100 MG CAPS (BENZONATATE) Take 1 tablet by mouth three times a day as needed for cough  #30 x 1   Entered and Authorized by:   Tereso Newcomer PA-C   Signed by:   Tereso Newcomer PA-C on 05/27/2009   Method used:   Print then Give to Patient   RxID:   [id number] DOXYCYCLINE HYCLATE 100 MG TABS (DOXYCYCLINE HYCLATE) Take 1 tablet by mouth two times a day until all gone  #14 x 0   Entered and Authorized by:   Tereso Newcomer PA-C    Signed by:   Tereso Newcomer PA-C on 05/27/2009   Method used:   Print then Give to Patient   RxID:   [id number]

## 2010-06-16 NOTE — Assessment & Plan Note (Signed)
Summary: SORE THROAT/ EAR PAIN//GK   Vital Signs:  Patient profile:   55 year old female Height:      62 inches Weight:      175 pounds BMI:     32.12 Temp:     98.2 degrees F oral Pulse rate:   88 / minute Pulse rhythm:   regular Resp:     18 per minute BP sitting:   140 / 80  (left arm) Cuff size:   regular  Vitals Entered By: [place] Shannon (June 10, 2009 3:16 PM) CC: pt is here for sickness... Is Patient Diabetic? No Pain Assessment Patient in pain? no       Does patient need assistance? Functional Status Self care Ambulation Normal   Primary Care Provider:  Tereso Newcomer PA-C  CC:  pt is here for sickness....  History of Present Illness: Here for recurrent URI symptoms. Took doxy for a few days and developed some nausea and vomiting and stopped taking. Felt ok for a couple days and then started having more otalgia, scratchy throat and frontal headache.  She feels lightheaded.  No dizziness.  No fever. No cough.  No dental pain.   Also notes weight gain.  Previously told me she was taking zoloft every day.  Today she tells me she only takes once a week . . . maybe.  No dyspnea.  No chest pain.  No sycope.  GERD controlled with protonix.    Allergies: 1)  ! * Codiene  Physical Exam  General:  alert, well-developed, and well-nourished.   Head:  normocephalic and atraumatic.   Eyes:  pupils equal, pupils round, and pupils reactive to light.   Ears:  R ear normal and L ear normal.   Nose:  no external deformity.   Mouth:  pharynx pink and moist, no erythema, and no exudates.   Neck:  supple and no cervical lymphadenopathy.   Lungs:  normal breath sounds, no crackles, and no wheezes.   Heart:  normal rate and regular rhythm.   Neurologic:  alert & oriented X3 and cranial nerves II-XII intact.   Psych:  normally interactive.     Impression & Recommendations:  Problem # 1:  ACUTE FRONTAL SINUSITIS (ICD-461.1) ongoing symptoms for over 2 weeks with headache  suggests sinusitis tx with amox three times a day for 7 days saline nose spray  tylenol as needed  f/u as needed  Her updated medication list for this problem includes:    Tessalon Perles 100 Mg Caps (Benzonatate) .Marland Kitchen... Take 1 tablet by mouth three times a day as needed for cough    Amoxicillin 500 Mg Caps (Amoxicillin) .Marland Kitchen... Take 1 tablet by mouth three times a day until all gone  Problem # 2:  WEIGHT GAIN (ICD-783.1)  suspect related to zoloft I am not sure if she really took on a regular basis or not if so, would explain weight gain she should keep a diary of her diet to see if her intake has chaged check TSH today . . . was normal in Oct  Orders: T-TSH [id number])  Problem # 3:  VITAMIN D DEFICIENCY (ICD-268.9)  check level  Orders: T-Assay of Vitamin D ([id number])  Problem # 4:  DYSLIPIDEMIA (ICD-272.4)  Her updated medication list for this problem includes:    Simvastatin 20 Mg Tabs (Simvastatin) ..... One tab bymouth daily  Orders: T-Comprehensive Metabolic Panel ([id number])  Problem # 5:  ESSENTIAL HYPERTENSION, BENIGN (ICD-401.1) may be up from illness recheck at  f/u  Her updated medication list for this problem includes:    Metoprolol Tartrate 25 Mg Tabs (Metoprolol tartrate) ..... One tab twice by mouth every day    Lisinopril-hydrochlorothiazide 20-12.5 Mg Tabs (Lisinopril-hydrochlorothiazide) .Marland Kitchen... Take 1 tablet by mouth once a day  Orders: T-Comprehensive Metabolic Panel ([id number])  Problem # 6:  Preventive Health Care (ICD-V70.0)  return for CPP patient wants to be checked for STD no h/o vaginal discharge, etc.  Orders: T-HIV Antibody  (Reflex) ([id number]) T-Syphilis Test (RPR) ([id number])  Complete Medication List: 1)  Protonix 40 Mg Tbec (Pantoprazole sodium) .... Take 1 tablet by mouth two times a day 2)  Simvastatin 20 Mg Tabs (Simvastatin) .... One tab bymouth daily 3)  Metoprolol Tartrate 25 Mg Tabs (Metoprolol tartrate)  .... One tab twice by mouth every day 4)  Vitamin D3 400 Unit Tabs (Cholecalciferol) .... Take 2 tablets by mouth once daily.  do not fill for 3 months (patient to complete 12 weeks of vitamin d2 first). 5)  Meclizine Hcl 25 Mg Tabs (Meclizine hcl) .... Take one tablet by mouth every 6-8 hours as needed for dizziness *d keller, md 6)  Dulcolax 10 Mg Supp (Bisacodyl) .Marland Kitchen.. 1 supp pr once daily as needed for constipation 7)  Lisinopril-hydrochlorothiazide 20-12.5 Mg Tabs (Lisinopril-hydrochlorothiazide) .... Take 1 tablet by mouth once a day 8)  Zyrtec Allergy 10 Mg Tabs (Cetirizine hcl) .... Take 1 tablet by mouth once a day as needed for allergies 9)  Promethazine Hcl 25 Mg Tabs (Promethazine hcl) .Marland Kitchen.. 1 by mouth q 8 hours as needed for nausea and vomiting 10)  Trazodone Hcl 50 Mg Tabs (Trazodone hcl) .... Take 1/2 to 1 tablet by mouth at bedtime as needed for sleep 11)  Tessalon Perles 100 Mg Caps (Benzonatate) .... Take 1 tablet by mouth three times a day as needed for cough 12)  Proventil Hfa 108 (90 Base) Mcg/act Aers (Albuterol sulfate) .Marland Kitchen.. 1-2 puffs every 4-6 hours as needed for cough or wheezing 13)  Amoxicillin 500 Mg Caps (Amoxicillin) .... Take 1 tablet by mouth three times a day until all gone  Patient Instructions: 1)  Return in February for CPP with Fabricio Endsley.  See the front desk to get date and time. 2)  Take your antibiotic as prescribed until ALL of it is gone, but stop if you develop a rash or swelling and contact our office as soon as possible.  3)  Write down what you eat for 2 weeks. 4)  Stop taking zoloft all together. 5)  Get plenty of rest, drink lots of clear liquids, and use Tylenol or Ibuprofen for fever and comfort. Return in 7-10 days if you're not better: sooner if you'er feeling worse.  Prescriptions: AMOXICILLIN 500 MG CAPS (AMOXICILLIN) Take 1 tablet by mouth three times a day until all gone  #21 x 0   Entered and Authorized by:   Tereso Newcomer PA-C   Signed by:    Tereso Newcomer PA-C on 06/10/2009   Method used:   Print then Give to Patient   RxID:   [id number]

## 2010-06-16 NOTE — Assessment & Plan Note (Signed)
Summary: fu with Scott in 3 months for bp check//gk   Vital Signs:  Patient profile:   55 year old female Weight:      179 pounds BMI:     32.86 Temp:     97.5 degrees F oral Pulse rate:   60 / minute Pulse rhythm:   regular Resp:     18 per minute BP sitting:   117 / 74  (left arm) Cuff size:   regular  Vitals Entered By: [place] Shannon (December 08, 2009 8:52 AM) CC: f/u...Marland KitchenMarland Kitchen pt says she is still not sleeping at night.... pt says the bendryl doesnt work.. Is Patient Diabetic? No Pain Assessment Patient in pain? no       Does patient need assistance? Functional Status Self care Ambulation Normal   Primary Care Provider:  Tereso Newcomer PA-C  CC:  f/u...Marland KitchenMarland Kitchen pt says she is still not sleeping at night.... pt says the bendryl doesnt work...  History of Present Illness: Here for f/u.  Gallstones:  Had cholecystectomy since last seen by me.  Doing well.  Somewhat sore.  Did have episode of nausea few days after.  Also, seen at urgent care for UTI.  Dyspnea:  Echo was ok.  PFTs done, but I have not seen results yet.  She was given Spiriva to start after her PFTs.  But, Jerrol Banana notes she cannot get Spiriva.  Apparently our pharmacy told her that we cannot get the medicine.  Not doing much since surgery.  Notes breathing is better.  Notes some wheezing at night.  Uses Proventil rarely.  Notes cough at night with clear sputum.  Notes a lot of postnasal drip.  Zyrtec helps.    Insomnia:  Has tried benadryl in past.  Has never tried trazodone.  No tv or reading in bed.  Sometimes cannot go to sleep.  Sometimes wakes up early.  No caffeine.  No exercise close to bedtime.  NO depressive thoughts.  No thoughts of suicide.  Insomnia started about a year ago.    Vertigo:  Went through [facility].  Still gets motion sick [place].  Wants to use scopalamine patches to help.   Problems Prior to Update: 1)  Cholecystectomy,  Laparoscopic, Hx of  (ICD-V45.79) 2)  Motion Sickness  (ICD-994.6) 3)  Cholelithiasis  (ICD-574.20) 4)  Liver Function Tests, Abnormal, Hx of  (ICD-V12.2) 5)  Viral Uri  (ICD-465.9) 6)  Shortness of Breath  (ICD-786.05) 7)  Seborrhea  (ICD-706.3) 8)  Edema  (ICD-782.3) 9)  Insomnia  (ICD-780.52) 10)  Weight Gain  (ICD-783.1) 11)  Acute Frontal Sinusitis  (ICD-461.1) 12)  Acute Bronchitis  (ICD-466.0) 13)  Actinic Keratosis  (ICD-702.0) 14)  Vitamin D Deficiency  (ICD-268.9) 15)  Allergic Rhinitis  (ICD-477.9) 16)  Depression  (ICD-311) 17)  Irritable Bowel Syndrome  (ICD-564.1) 18)  Constipation  (ICD-564.00) 19)  Benign Positional Vertigo, Hx of  (ICD-V12.49) 20)  Tobacco Use, Quit  (ICD-V15.82) 21)  Preventive Health Care  (ICD-V70.0) 22)  Gerd  (ICD-530.81) 23)  Dyslipidemia  (ICD-272.4) 24)  Essential Hypertension, Benign  (ICD-401.1)  Current Medications (verified): 1)  Protonix 40 Mg Tbec (Pantoprazole Sodium) .... Take 1 Tablet By Mouth Once Daily 2)  Simvastatin 40 Mg Tabs (Simvastatin) .... Take 1 Tab By Mouth At Bedtime For Cholesterol 3)  Metoprolol Tartrate 25 Mg Tabs (Metoprolol Tartrate) .... One Tab Twice By Mouth Every Day 4)  Vitamin D3  400 Unit Tabs (Cholecalciferol) .... Take 2 Tablets By Mouth Once Daily.  Do Not Fill For 3 Months (Patient To Complete 12 Weeks of Vitamin D2 First). 5)  Meclizine Hcl 25 Mg Tabs (Meclizine Hcl) .... Take One Tablet By Mouth Every 6-8 Hours As Needed For Dizziness *d Lorenz Coaster, Md 6)  Dulcolax 10 Mg Supp (Bisacodyl) .Marland Kitchen.. 1 Supp Pr Once Daily As Needed For Constipation 7)  Lisinopril-Hydrochlorothiazide 20-25 Mg Tabs (Lisinopril-Hydrochlorothiazide) .... Take 1 Tablet By Mouth Once A Day For Blood Pressure 8)  Zyrtec Allergy 10 Mg Tabs (Cetirizine Hcl) .... Take 1 Tablet By Mouth Once A Day As Needed For Allergies 9)  Promethazine Hcl 25 Mg Tabs (Promethazine Hcl) .Marland Kitchen.. 1 By Mouth Q 8 Hours As Needed For Nausea and Vomiting 10)   Trazodone Hcl 100 Mg Tabs (Trazodone Hcl) .... Take 1 Tab By Mouth At Bedtime As Needed For Sleep 11)  Tessalon Perles 100 Mg Caps (Benzonatate) .... Take 1 Tablet By Mouth Three Times A Day As Needed For Cough 12)  Proventil Hfa 108 (90 Base) Mcg/act Aers (Albuterol Sulfate) .Marland Kitchen.. 1-2 Puffs Every 4-6 Hours As Needed For Cough or Wheezing 13)  Ergocalciferol 50000 Unit Caps (Ergocalciferol) .Marland Kitchen.. 1 By Mouth Once A Week For 12 Weeks 14)  Spiriva Handihaler 18 Mcg Caps (Tiotropium Bromide Monohydrate) .Marland Kitchen.. 1 Inhalation Daily 15)  Promethazine Hcl 25 Mg Tabs (Promethazine Hcl) .... Take 1 By Mouth Every 8 Hours As Needed For Nausea and Vomiting.  Allergies (verified): 1)  ! * Codiene  Past History:  Past Medical History: Current Problems:  GERD (ICD-530.81) DYSLIPIDEMIA (ICD-272.4) ESSENTIAL HYPERTENSION, BENIGN (ICD-401.1) Normal Coronaries by Cardiac Cath. 12/18/2008 (EF 60%; runs of PACs during procedure - beta blocker recommended) Echo 10/2009: EF 60-65%; Grade 2 Diastolic Dysfunction Irritable Bowel Syndrome Depression Vitamin D Deficiency Benign Paroxysmal Positional Vertigo PFTs 10/06/2009:  normal airflows; no response to bronchodilator (FEV1 93%; FEV1/FVC 81%)  Past Surgical History: s/p bronchoscopy 05/22/1997 - LN biopsy (benign per patient) s/p appendectomy s/p tonsillectomy s/p right foot surgery  Cholecystectomy (10/2009)  Social History: Reviewed history from 12/29/2008 and no changes required. unemployed waitress Former Smoker 50 pack years (quit 12/2008) no ETOH no drugs Divorced 2 daughters lives in [place]  Physical Exam  General:  alert, well-developed, and well-nourished.   Head:  normocephalic and atraumatic.   Neck:  supple.   Lungs:  normal breath sounds, no crackles, and no wheezes.   Heart:  normal rate and regular rhythm.   Abdomen:  soft and non-tender.   Extremities:  trace left pedal edema and trace right pedal edema.   Neurologic:  alert & oriented X3  and cranial nerves II-XII intact.   Skin:  seborrhea noted left face . Marland Kitchen .patient notes it is getting larger Psych:  normally interactive.     Impression & Recommendations:  Problem # 1:  MOTION SICKNESS (ICD-994.6) will give her scopolamine patches to use as needed  Problem # 2:  CHOLELITHIASIS (ICD-574.20) doing well s/p cholecystectomy  Problem # 3:  SHORTNESS OF BREATH (ICD-786.05) PFTs found during appt has normal airflows no response to bronchodilator suspect element of deconditioning and obesity suggest she increase activity and lose weight suspect allergic rhinitis as well  Problem # 4:  SEBORRHEA (ICD-706.3) patient feels like area getting larger arrange f/u at derm clinic  Problem # 5:  INSOMNIA (ICD-780.52) try trazodone  Problem # 6:  ESSENTIAL HYPERTENSION, BENIGN (ICD-401.1) controlled  Her updated medication list for this problem includes:  Metoprolol Tartrate 25 Mg Tabs (Metoprolol tartrate) ..... One tab twice by mouth every day    Lisinopril-hydrochlorothiazide 20-25 Mg Tabs (Lisinopril-hydrochlorothiazide) .Marland Kitchen... Take 1 tablet by mouth once a day for blood pressure  Problem # 7:  DYSLIPIDEMIA (ICD-272.4) schedule fasting lipids  Her updated medication list for this problem includes:    Simvastatin 40 Mg Tabs (Simvastatin) .Marland Kitchen... Take 1 tab by mouth at bedtime for cholesterol  Problem # 8:  PREVENTIVE HEALTH CARE (ICD-V70.0)  wants to go to dental clinic for cleaning has appt card for mammogram  Orders: Dental Referral (Dentist)  Complete Medication List: 1)  Protonix 40 Mg Tbec (Pantoprazole sodium) .... Take 1 tablet by mouth once daily 2)  Simvastatin 40 Mg Tabs (Simvastatin) .... Take 1 tab by mouth at bedtime for cholesterol 3)  Metoprolol Tartrate 25 Mg Tabs (Metoprolol tartrate) .... One tab twice by mouth every day 4)  Vitamin D3 400 Unit Tabs (Cholecalciferol) .... Take 2 tablets by mouth once daily.  do not fill for 3 months (patient to  complete 12 weeks of vitamin d2 first). 5)  Meclizine Hcl 25 Mg Tabs (Meclizine hcl) .... Take one tablet by mouth every 6-8 hours as needed for dizziness *d keller, md 6)  Dulcolax 10 Mg Supp (Bisacodyl) .Marland Kitchen.. 1 supp pr once daily as needed for constipation 7)  Lisinopril-hydrochlorothiazide 20-25 Mg Tabs (Lisinopril-hydrochlorothiazide) .... Take 1 tablet by mouth once a day for blood pressure 8)  Zyrtec Allergy 10 Mg Tabs (Cetirizine hcl) .... Take 1 tablet by mouth once a day as needed for allergies 9)  Promethazine Hcl 25 Mg Tabs (Promethazine hcl) .Marland Kitchen.. 1 by mouth q 8 hours as needed for nausea and vomiting 10)  Trazodone Hcl 100 Mg Tabs (Trazodone hcl) .... Take 1 tab by mouth at bedtime as needed for sleep 11)  Tessalon Perles 100 Mg Caps (Benzonatate) .... Take 1 tablet by mouth three times a day as needed for cough 12)  Proventil Hfa 108 (90 Base) Mcg/act Aers (Albuterol sulfate) .Marland Kitchen.. 1-2 puffs every 4-6 hours as needed for cough or wheezing 13)  Ergocalciferol 50000 Unit Caps (Ergocalciferol) .Marland Kitchen.. 1 by mouth once a week for 12 weeks 14)  Promethazine Hcl 25 Mg Tabs (Promethazine hcl) .... Take 1 by mouth every 8 hours as needed for nausea and vomiting. 15)  Transderm-scop 1.5 Mg Pt72 (Scopolamine base) .... Apply patch every 3 days as needed for motion sickness 16)  Trazodone Hcl 50 Mg Tabs (Trazodone hcl) .... Take 1 tab by mouth at bedtime as needed for sleep  Patient Instructions: 1)  Schedule fasting lipids at your convenience in the next 2-4 weeks. 2)  Schedule appointment with dermatology clinic for follow up on seborrhea. 3)  Someone will call you for an appointment with the [facility]. 4)  Go ahead and schedule your mammogram. 5)  Schedule follow up with Scott in 5 months for high blood pressure. Prescriptions: TRAZODONE HCL 50 MG TABS (TRAZODONE HCL) Take 1 tab by mouth at bedtime as needed for sleep  #20 x 1   Entered and Authorized by:   Tereso Newcomer PA-C   Signed by:    Tereso Newcomer PA-C on 12/08/2009   Method used:   Print then Give to Patient   RxID:   [id number] TRANSDERM-SCOP 1.5 MG PT72 (SCOPOLAMINE BASE) apply patch every 3 days as needed for motion sickness  #20 patches x 0   Entered and Authorized by:   Tereso Newcomer PA-C   Signed by:  Tereso Newcomer PA-C on 12/08/2009   Method used:   Print then Give to Patient   RxID:   [id number]

## 2010-06-16 NOTE — Miscellaneous (Signed)
Summary: INITIAL SUMMARY//FAXED & MAILED  INITIAL SUMMARY//FAXED & MAILED   Imported By: Arta Bruce 06/07/2009 09:43:45  _____________________________________________________________________  External Attachment:    Type:   Image     Comment:   External Document

## 2010-06-16 NOTE — Letter (Signed)
Summary: REFERRAL//COLONOCOPY  REFERRAL//COLONOCOPY   Imported By: Arta Bruce 06/03/2009 15:33:37  _____________________________________________________________________  External Attachment:    Type:   Image     Comment:   External Document

## 2010-06-16 NOTE — Letter (Signed)
Summary: *HSN Results Follow up  [facility]  [address] [place], [place] [address]   Phone: [phone]  Fax: [phone]      01/27/2010   [name] [address] [place], [place]  [address]  [place]   Dear  [name],                            ____S.Drinkard,FNP   ____D. Gore,FNP       ____B. McPherson,MD   ____V. Rankins,MD    ____E. Mulberry,MD    ____N. Daphine Deutscher, FNP  ____D. Reche Dixon, MD    ____K. Philipp Deputy, MD    __x__S. Alben Spittle, PA-C     This letter is to inform you that your recent test(s):  _______Pap Smear    ___x____Lab Test     _______X-ray    ___x____ is within acceptable limits  _______ requires a medication change  _______ requires a follow-up lab visit  _______ requires a follow-up visit with your provider   Comments:  Blood counts were completely normal.  Keep an eye on that rash on your face and follow up with me if it worsens or changes.       _________________________________________________________ If you have any questions, please contact our office                     Sincerely,  Tereso Newcomer PA-C [facility]

## 2010-06-16 NOTE — Letter (Signed)
Summary: [facility]  [facility]   Imported By: Arta Bruce 09/21/2009 12:35:14  _____________________________________________________________________  External Attachment:    Type:   Image     Comment:   External Document

## 2010-06-16 NOTE — Letter (Signed)
Summary: *HSN Results Follow up  [facility]  [address] [place], [place] [address]   Phone: [phone]  Fax: [phone]      07/26/2009   [name] [address] [place], [place]  [address]  [place]   Dear  [name],                            ____S.Drinkard,FNP   ____D. Gore,FNP       ____B. McPherson,MD   ____V. Rankins,MD    ____E. Mulberry,MD    ____N. Daphine Deutscher, FNP  ____D. Reche Dixon, MD    ____K. Philipp Deputy, MD    __x__S. Alben Spittle, PA-C     This letter is to inform you that your recent test(s):  ___x____Pap Smear    _______Lab Test     _______X-ray    ___x____ is within acceptable limits  _______ requires a medication change  _______ requires a follow-up lab visit  _______ requires a follow-up visit with your provider   Comments:       _________________________________________________________ If you have any questions, please contact our office                     Sincerely,  Tereso Newcomer PA-C [facility]

## 2010-06-16 NOTE — Progress Notes (Signed)
Summary: CANT GET HER MEDS  Phone Note Call from Patient Call back at Home Phone [phone]   Summary of Call: [facility] PT. MS OCONNOR CALLED TO LET YOU KNOW THAT SHE CAN NOT GET THE BP MED (HYZAAR) AT THE HEALTH DEPT, BECAUSE THEY DON'T HAVE THE FUNDS FOR IT, AND THEY DON'T HAVE THE FLONASE OR THE NASUA MEDS. AND WE DON'T HAVE IT AT [facility]. AS WELL. SHE DOESN'T KNOW WHAT TO DO. Initial call taken by: Leodis Rains,  January 26, 2010 12:52 PM  Follow-up for Phone Call        Sent to S. Weaver.  Dutch Quint RN  January 26, 2010 1:00 PM   Additional Follow-up for Phone Call Additional follow up Details #1::        We have Hyzaar and flonase. If not, please find out equivalent. Please find out what antiemetics [facility] has. Tereso Newcomer PA-C  January 26, 2010 1:30 PM     Additional Follow-up for Phone Call Additional follow up Details #2::    [facility] has Hyzaar, but Flonase has to have the pt. enrolled.  They only have promethazine available.  Dutch Quint RN  January 26, 2010 2:37 PM  What nasal steroid do they have?  They should have some Flonase on the shelf she can have until the ICP is done. Notify her about phenergan being the only thing we can get her. Tereso Newcomer PA-C  January 26, 2010 5:25 PM   [facility] has Nasacort and Rhinocort.  Dutch Quint RN  January 27, 2010 12:14 PM  change to nasacort Tereso Newcomer PA-C  January 27, 2010 1:30 PM   Additional Follow-up for Phone Call Additional follow up Details #3:: Details for Additional Follow-up Action Taken: Pt. notified of all Rx and alternatives.  States that she already got her Hyzaar and antiemetic and paid out-of-pocket.  No other needs verbalized.  Dutch Quint RN  January 27, 2010 3:25 PM   New/Updated Medications: NASACORT AQ 55 MCG/ACT AERS (TRIAMCINOLONE ACETONIDE) 1-2 sprays each nostril once daily Prescriptions: NASACORT AQ 55 MCG/ACT AERS (TRIAMCINOLONE ACETONIDE) 1-2 sprays each  nostril once daily  #1 x 3   Entered and Authorized by:   Tereso Newcomer PA-C   Signed by:   Tereso Newcomer PA-C on 01/27/2010   Method used:   Faxed to ...       [facility] (retail)       [address] [place], [place]  [address]       Ph: [phone]       Fax: [phone]   RxID:   [id number]

## 2010-06-16 NOTE — Miscellaneous (Signed)
Summary: Rehab Report//INITIAL SUMMARY  Rehab Report//INITIAL SUMMARY   Imported By: Arta Bruce 07/22/2009 15:40:01  _____________________________________________________________________  External Attachment:    Type:   Image     Comment:   External Document

## 2010-06-16 NOTE — Assessment & Plan Note (Signed)
Summary: cpp exam//gk   Vital Signs:  Patient profile:   55 year old female Height:      62 inches Weight:      176 pounds BMI:     32.31 Temp:     98.0 degrees F oral Pulse rate:   61 / minute Pulse rhythm:   regular Resp:     18 per minute BP sitting:   133 / 85  (left arm) Cuff size:   regular  Vitals Entered By: [place] Shannon (July 21, 2009 10:10 AM)  Primary Care Provider:  Tereso Newcomer PA-C   History of Present Illness: Here for CPP. No h/o abnl pap. No vaginal bleeding, discharge or odor. Mammo done. Colo done. No FHx of breast CA. No calcium.  Depression/Anxiety:  Stopped taking SSRI due to side effects.  Still complaining of trouble sleeping.  Falls asleep and wakes up in an hour.  No ETOH.  No caffeine.  No reading or watching tv in bed.  Has never had trouble sleeping until 3 years ago.  Trazodone 50 mg has not helped.    Also, notes swelling in ankles with prolonged standing.  No chest pain or shortness of breath.  No PND or orthopnea.   Hypertension History:      She complains of peripheral edema, but denies chest pain, dyspnea with exertion, and syncope.  She notes no problems with any antihypertensive medication side effects.        Positive major cardiovascular risk factors include hyperlipidemia and hypertension.  Negative major cardiovascular risk factors include female age less than 3 years old and non-tobacco-user status.     Problems Prior to Update: 1)  Edema  (ICD-782.3) 2)  Insomnia  (ICD-780.52) 3)  Weight Gain  (ICD-783.1) 4)  Acute Frontal Sinusitis  (ICD-461.1) 5)  Acute Bronchitis  (ICD-466.0) 6)  Actinic Keratosis  (ICD-702.0) 7)  Vitamin D Deficiency  (ICD-268.9) 8)  Allergic Rhinitis  (ICD-477.9) 9)  Depression  (ICD-311) 10)  Irritable Bowel Syndrome  (ICD-564.1) 11)  Constipation  (ICD-564.00) 12)  Benign Positional Vertigo, Hx of  (ICD-V12.49) 13)  Tobacco Use, Quit  (ICD-V15.82) 14)  Preventive Health Care  (ICD-V70.0) 15)   Gerd  (ICD-530.81) 16)  Dyslipidemia  (ICD-272.4) 17)  Essential Hypertension, Benign  (ICD-401.1)  Allergies: 1)  ! * Codiene  Past History:  Past Medical History: Last updated: 02/23/2009 Current Problems:  GERD (ICD-530.81) DYSLIPIDEMIA (ICD-272.4) ESSENTIAL HYPERTENSION, BENIGN (ICD-401.1) Normal Coronaries by Cardiac Cath. 12/18/2008 (EF 60%; runs of PACs during procedure - beta blocker recommended) Irritable Bowel Syndrome Depression Vitamin D Deficiency Benign Paroxysmal Positional Vertigo  Past Surgical History: Last updated: 12/29/2008 s/p bronchoscopy 05/22/1997 - LN biopsy (benign per patient) s/p appendectomy s/p tonsillectomy s/p right foot surgery   Family History: Reviewed history from 12/29/2008 and no changes required. Father died at 37 with MI Bro died with MI 61 Sis CABG at 45 yo Mom - DM, HTN  Social History: Reviewed history from 12/29/2008 and no changes required. 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  The patient denies fever, chest pain, syncope, dyspnea on exertion, abdominal pain, melena, hematochezia, severe indigestion/heartburn, hematuria, and depression.         rest ROS neg  Physical Exam  General:  alert, well-developed, and well-nourished.   Head:  normocephalic  and atraumatic.   Eyes:  pupils equal, pupils round, and pupils reactive to light.   fundi diff to visualize Ears:  R ear normal and L ear normal.   Nose:  no external deformity.   Mouth:  pharynx pink and moist.   Neck:  supple, no thyromegaly, no JVD, no carotid bruits, and no cervical lymphadenopathy.   Breasts:  skin/areolae normal, no masses, no abnormal thickening, no nipple discharge, no tenderness, and no adenopathy.   Lungs:  normal breath sounds, no crackles, and no wheezes.   Heart:  normal rate,  regular rhythm, and no murmur.   Abdomen:  soft, non-tender, normal bowel sounds, and no hepatomegaly.   Rectal:  no external abnormalities.   Genitalia:  normal introitus, no external lesions, no vaginal discharge, mucosa pink and moist, no vaginal or cervical lesions, no vaginal atrophy, and no friaility or hemorrhage.   body habitus makes it diificult to assess the fundus and adnexae Msk:  normal ROM.   Pulses:  DP/PT 1+ bilat Extremities:  trace left pedal edema and trace right pedal edema.   Neurologic:  alert & oriented X3 and cranial nerves II-XII intact.   Skin:  turgor normal.   Psych:  normally interactive.     Impression & Recommendations:  Problem # 1:  DEPRESSION (ICD-311) PHQ9= 4 mood seems ok patient did not like meds feels like stress levels ok  Her updated medication list for this problem includes:    Trazodone Hcl 100 Mg Tabs (Trazodone hcl) .Marland Kitchen... Take 1 tab by mouth at bedtime as needed for sleep  Problem # 2:  INSOMNIA (ICD-780.52) try to increase trazodone to 100 mg at bedtime if no help, she should call . . . could try rozerem at that time  Problem # 3:  EDEMA (ICD-782.3) mild and dependent she had normal cardiac eval 6 mos ago she had normal TSH recently no protein in urine adjust lisinopril/hctz to 25 mg of HCTZ advised her to get compression stockings as well  Her updated medication list for this problem includes:    Lisinopril-hydrochlorothiazide 20-25 Mg Tabs (Lisinopril-hydrochlorothiazide) .Marland Kitchen... Take 1 tablet by mouth once a day for blood pressure  Problem # 4:  VITAMIN D DEFICIENCY (ICD-268.9) recheck in 3 mos not sure if she is taking correctly . . . problems at [facility] pharm  Problem # 5:  ESSENTIAL HYPERTENSION, BENIGN (ICD-401.1)  Her updated medication list for this problem includes:    Metoprolol Tartrate 25 Mg Tabs (Metoprolol tartrate) ..... One tab twice by mouth every day    Lisinopril-hydrochlorothiazide 20-25 Mg Tabs  (Lisinopril-hydrochlorothiazide) .Marland Kitchen... Take 1 tablet by mouth once a day for blood pressure  Orders: T-Urinalysis ([id number])  Problem # 6:  DYSLIPIDEMIA (ICD-272.4)  check lipids today  Her updated medication list for this problem includes:    Simvastatin 20 Mg Tabs (Simvastatin) ..... One tab bymouth daily  Orders: T-Lipid Profile [id number])  Problem # 7:  PREVENTIVE HEALTH CARE (ICD-V70.0)  Orders: KOH/ WET Mount [phone]) T- GC Chlamydia (63016) T-Pap Smear, Thin Prep (01093) T-Urinalysis (23557-32202) T-HIV Antibody  (Reflex) ([id number])  Problem # 8:  GERD (ICD-530.81) stable  Her updated medication list for this problem includes:    Protonix 40 Mg Tbec (Pantoprazole sodium) .Marland Kitchen... Take 1 tablet by mouth once daily  Complete Medication List: 1)  Protonix 40 Mg Tbec (Pantoprazole sodium) .... Take 1 tablet by mouth once daily 2)  Simvastatin 20 Mg Tabs (Simvastatin) .... One tab bymouth daily 3)  Metoprolol  Tartrate 25 Mg Tabs (Metoprolol tartrate) .... One tab twice by mouth every day 4)  Vitamin D3 400 Unit Tabs (Cholecalciferol) .... Take 2 tablets by mouth once daily.  do not fill for 3 months (patient to complete 12 weeks of vitamin d2 first). 5)  Meclizine Hcl 25 Mg Tabs (Meclizine hcl) .... Take one tablet by mouth every 6-8 hours as needed for dizziness *d keller, md 6)  Dulcolax 10 Mg Supp (Bisacodyl) .Marland Kitchen.. 1 supp pr once daily as needed for constipation 7)  Lisinopril-hydrochlorothiazide 20-25 Mg Tabs (Lisinopril-hydrochlorothiazide) .... Take 1 tablet by mouth once a day for blood pressure 8)  Zyrtec Allergy 10 Mg Tabs (Cetirizine hcl) .... Take 1 tablet by mouth once a day as needed for allergies 9)  Promethazine Hcl 25 Mg Tabs (Promethazine hcl) .Marland Kitchen.. 1 by mouth q 8 hours as needed for nausea and vomiting 10)  Trazodone Hcl 100 Mg Tabs (Trazodone hcl) .... Take 1 tab by mouth at bedtime as needed for sleep 11)  Tessalon Perles 100 Mg Caps (Benzonatate)  .... Take 1 tablet by mouth three times a day as needed for cough 12)  Proventil Hfa 108 (90 Base) Mcg/act Aers (Albuterol sulfate) .Marland Kitchen.. 1-2 puffs every 4-6 hours as needed for cough or wheezing 13)  Ergocalciferol 50000 Unit Caps (Ergocalciferol) .Marland Kitchen.. 1 by mouth once a week for 12 weeks  Hypertension Assessment/Plan:      The patient's hypertensive risk group is category B: At least one risk factor (excluding diabetes) with no target organ damage.  Her calculated 10 year risk of coronary heart disease is 7 %.  Today's blood pressure is 133/85.  Her blood pressure goal is < 140/90.  Patient Instructions: 1)  Get compression stockings over the counter at any drug store or [facility] or [employer].  Wear from morning until night, especially on the days you are on your feet more. 2)  Try the higher dose of trazadone for a few weeks.  Call if you have no relief. 3)  Go ahead and reschedule with Marchelle Folks.  She may be able to help some with your sleep as well. 4)  Please schedule a follow-up appointment in 3 months with Wayne Wicklund for blood pressure. 5)  Return in 2 weeks for blood pressure check and bmet (401.1)  Prescriptions: MECLIZINE HCL 25 MG TABS (MECLIZINE HCL) take one tablet by mouth every 6-8 hours as needed for dizziness Amada Jupiter, MD  #20 x 1   Entered and Authorized by:   Tereso Newcomer PA-C   Signed by:   Tereso Newcomer PA-C on 07/21/2009   Method used:   Print then Give to Patient   RxID:   [id number] LISINOPRIL-HYDROCHLOROTHIAZIDE 20-25 MG TABS (LISINOPRIL-HYDROCHLOROTHIAZIDE) Take 1 tablet by mouth once a day for blood pressure  #30 x 5   Entered and Authorized by:   Tereso Newcomer PA-C   Signed by:   Tereso Newcomer PA-C on 07/21/2009   Method used:   Print then Give to Patient   RxID:   [id number] TRAZODONE HCL 100 MG TABS (TRAZODONE HCL) Take 1 tab by mouth at bedtime as needed for sleep  #30 x 0   Entered and Authorized by:   Tereso Newcomer PA-C   Signed by:   Tereso Newcomer PA-C on  07/21/2009   Method used:   Print then Give to Patient   RxID:   [id number]   Laboratory Results   Urine Tests  Date/Time Received: July 21, 2009 10:24 AM  Routine Urinalysis   Glucose: negative   (Normal Range: Negative) Bilirubin: negative   (Normal Range: Negative) Ketone: negative   (Normal Range: Negative) Spec. Gravity: <1.005   (Normal Range: 1.003-1.035) Blood: negative   (Normal Range: Negative) pH: 7.0   (Normal Range: 5.0-8.0) Protein: negative   (Normal Range: Negative) Urobilinogen: 0.2   (Normal Range: 0-1) Nitrite: negative   (Normal Range: Negative) Leukocyte Esterace: negative   (Normal Range: Negative)      Wet Mount Source: vaginal WBC/hpf: 1-5 Bacteria/hpf: rare Clue cells/hpf: none  Negative whiff Yeast/hpf: none Wet Mount KOH: Negative Trichomonas/hpf: none    EKG  Procedure date:  07/21/2009  Findings:      Normal sinus rhythm with rate of:  73 normal axis  no isch changes

## 2010-06-16 NOTE — Progress Notes (Signed)
Summary: Requesting Medical Assistant call her back  Phone Note Call from Patient Call back at [phone]   Summary of Call: This past Tuesday the pt had bladder surgery and she is urinating a lot after the surgery and is hurting with a lot of pressure.  Pt still has not contact the surgeon yet but she wanted to know  if the medical assistant can call her back. Alben Spittle PA-c  Initial call taken by: Manon Hilding,  November 04, 2009 4:54 PM  Follow-up for Phone Call        Pt. states she went to Urgent Care yesterday and was Dx with UTI given antibiotics. Encouraged to drink plenty of fluids. Has an appt. here next Fri.  Follow-up by: Gaylyn Cheers RN,  November 05, 2009 8:53 AM

## 2010-06-16 NOTE — Assessment & Plan Note (Signed)
Summary: SINUS INFECTION/ LACK OF ENERGY/ FEET SWELLING//GK   Vital Signs:  Patient profile:   55 year old female Height:      62 inches Weight:      184 pounds BMI:     33.78 Temp:     97.5 degrees F oral Pulse rate:   60 / minute Pulse rhythm:   regular Resp:     18 per minute BP sitting:   116 / 67  (left arm) Cuff size:   regular  Vitals Entered By: [place] Shannon (Sep 23, 2009 12:58 PM) CC: PT IS SINUS INFECTION.. PT HAS NOT TRIED ANY MEDS....  PT SAYS SHE HOLDING A LOT OF FLUID  LATELY... PT SAYS HER ANKLES ARE SWOLLEN.. Is Patient Diabetic? No Pain Assessment Patient in pain? no       Does patient need assistance? Functional Status Self care Ambulation Normal   Primary Care Provider:  Tereso Newcomer PA-C  CC:  PT IS SINUS INFECTION.. PT HAS NOT TRIED ANY MEDS....  PT SAYS SHE HOLDING A LOT OF FLUID  LATELY... PT SAYS HER ANKLES ARE SWOLLEN...  History of Present Illness: 23 -year-old female presents with 2 days of sinus drainage.  Of note, she is also complaining of increased dyspnea and lower extremity edema.  She's noted this for the last one to 2 months.  She was on a Proventil inhaler at one time.  This seemed to help her breathing quite a bit.  She has a long history of smoking.  She no longer smokes cigarettes.  She's also had trouble with weight gain over the last several months.  I checked her TSH recently and it was normal.  She is no longer on any medications that would cause weight gain.  I believe she was on an SSRI at one time.  She has gained about 20 pounds since August.she sleeps on 2-3 pillows.  She's done this for the last several years.  There's been no change.  She denies PND.  She is working in a factory now.  She spends about 6 hours on her feet about 4 days a week.  She coughs at times.  This is a dry, nonproductive cough.  She denies hemoptysis.  She denies chest pain.  She denies syncope.  She denies palpitations.  She describes New [place] Heart  Association class II to class IIb symptoms.  She denies fevers.  She denies sore throat.  Problems Prior to Update: 1)  Viral Uri  (ICD-465.9) 2)  Shortness of Breath  (ICD-786.05) 3)  Seborrhea  (ICD-706.3) 4)  Edema  (ICD-782.3) 5)  Insomnia  (ICD-780.52) 6)  Weight Gain  (ICD-783.1) 7)  Acute Frontal Sinusitis  (ICD-461.1) 8)  Acute Bronchitis  (ICD-466.0) 9)  Actinic Keratosis  (ICD-702.0) 10)  Vitamin D Deficiency  (ICD-268.9) 11)  Allergic Rhinitis  (ICD-477.9) 12)  Depression  (ICD-311) 13)  Irritable Bowel Syndrome  (ICD-564.1) 14)  Constipation  (ICD-564.00) 15)  Benign Positional Vertigo, Hx of  (ICD-V12.49) 16)  Tobacco Use, Quit  (ICD-V15.82) 17)  Preventive Health Care  (ICD-V70.0) 18)  Gerd  (ICD-530.81) 19)  Dyslipidemia  (ICD-272.4) 20)  Essential Hypertension, Benign  (ICD-401.1)  Allergies: 1)  ! * Codiene  Past History:  Past Medical History: Last updated: 02/23/2009 Current Problems:  GERD (ICD-530.81) DYSLIPIDEMIA (ICD-272.4) ESSENTIAL HYPERTENSION, BENIGN (ICD-401.1) Normal Coronaries by Cardiac Cath. 12/18/2008 (EF 60%; runs of PACs during procedure - beta blocker recommended) Irritable Bowel Syndrome Depression Vitamin D Deficiency Benign Paroxysmal Positional  Vertigo  Past Surgical History: Last updated: 12/29/2008 s/p bronchoscopy 05/22/1997 - LN biopsy (benign per patient) s/p appendectomy s/p tonsillectomy s/p right foot surgery   Review of Systems  The patient denies fever, chest pain, syncope, hemoptysis, melena, and hematochezia.    Physical Exam  General:  alert, well-developed, and well-nourished.   Head:  normocephalic and atraumatic.   Eyes:  pupils equal, pupils round, and pupils reactive to light.   palpebral conjunctivae red bilat  Ears:  R ear normal and L ear normal.   Nose:  no external deformity.   Mouth:  pharynx pink and moist and no exudates.   Neck:  supple, no JVD, no HJR, and no cervical lymphadenopathy.     Lungs:  normal breath sounds, no crackles, and no wheezes.   Heart:  normal rate and regular rhythm.   Abdomen:  soft, non-tender, and no hepatomegaly.   Extremities:  Tr-1+ edema bilat ? faint varicosities noted bilat  Neurologic:  alert & oriented X3 and cranial nerves II-XII intact.   Skin:  seborrhea noted left face . Marland Kitchen .patient notes it is getting larger Psych:  normally interactive.     Impression & Recommendations:  Problem # 1:  SEBORRHEA (ICD-706.3) schedule appt at derm clinic for f/u  Problem # 2:  WEIGHT GAIN (ICD-783.1)  advised her to monitor her diet TSH has been normal check CBC  Orders: T-Comprehensive Metabolic Panel ([id number]) T-CBC w/Diff ([id number])  Problem # 3:  SHORTNESS OF BREATH (ICD-786.05)  likely related to COPD she is convinced there is something more wrong with edema, will send for Echo check BNP  check PFTs start Spiriva once PFTs done  Orders: T-CBC w/Diff ([id number]) T-BNP  (B Natriuretic Peptide) ([id number]) 2 D Echo (2 D Echo) PFT Baseline-Pre/Post Bronchodiolator (PFT Baseline-Pre/Pos)  Problem # 4:  VIRAL URI (ICD-465.9) fluids, rest, tylenol as needed continue taking zyrtec as needed  Her updated medication list for this problem includes:    Zyrtec Allergy 10 Mg Tabs (Cetirizine hcl) .Marland Kitchen... Take 1 tablet by mouth once a day as needed for allergies    Promethazine Hcl 25 Mg Tabs (Promethazine hcl) .Marland Kitchen... 1 by mouth q 8 hours as needed for nausea and vomiting    Tessalon Perles 100 Mg Caps (Benzonatate) .Marland Kitchen... Take 1 tablet by mouth three times a day as needed for cough  Complete Medication List: 1)  Protonix 40 Mg Tbec (Pantoprazole sodium) .... Take 1 tablet by mouth once daily 2)  Simvastatin 40 Mg Tabs (Simvastatin) .... Take 1 tab by mouth at bedtime for cholesterol 3)  Metoprolol Tartrate 25 Mg Tabs (Metoprolol tartrate) .... One tab twice by mouth every day 4)  Vitamin D3 400 Unit Tabs (Cholecalciferol) ....  Take 2 tablets by mouth once daily.  do not fill for 3 months (patient to complete 12 weeks of vitamin d2 first). 5)  Meclizine Hcl 25 Mg Tabs (Meclizine hcl) .... Take one tablet by mouth every 6-8 hours as needed for dizziness *d keller, md 6)  Dulcolax 10 Mg Supp (Bisacodyl) .Marland Kitchen.. 1 supp pr once daily as needed for constipation 7)  Lisinopril-hydrochlorothiazide 20-25 Mg Tabs (Lisinopril-hydrochlorothiazide) .... Take 1 tablet by mouth once a day for blood pressure 8)  Zyrtec Allergy 10 Mg Tabs (Cetirizine hcl) .... Take 1 tablet by mouth once a day as needed for allergies 9)  Promethazine Hcl 25 Mg Tabs (Promethazine hcl) .Marland Kitchen.. 1 by mouth q 8 hours as needed for nausea and vomiting 10)  Trazodone Hcl  100 Mg Tabs (Trazodone hcl) .... Take 1 tab by mouth at bedtime as needed for sleep 11)  Tessalon Perles 100 Mg Caps (Benzonatate) .... Take 1 tablet by mouth three times a day as needed for cough 12)  Proventil Hfa 108 (90 Base) Mcg/act Aers (Albuterol sulfate) .Marland Kitchen.. 1-2 puffs every 4-6 hours as needed for cough or wheezing 13)  Ergocalciferol 50000 Unit Caps (Ergocalciferol) .Marland Kitchen.. 1 by mouth once a week for 12 weeks 14)  Spiriva Handihaler 18 Mcg Caps (Tiotropium bromide monohydrate) .Marland Kitchen.. 1 inhalation daily  Patient Instructions: 1)  Write down everything you eat for 2 weeks. 2)  Get compression stockings at the drug store and wear every day when you are on your feet. 3)  Please schedule a follow-up appointment in 4 weeks with Kimo Bancroft for swelling and shortness of breath. 4)  Start Spiriva after you lung test. Prescriptions: PROVENTIL HFA 108 (90 BASE) MCG/ACT AERS (ALBUTEROL SULFATE) 1-2 puffs every 4-6 hours as needed for cough or wheezing  #1 x 5   Entered and Authorized by:   Tereso Newcomer PA-C   Signed by:   Tereso Newcomer PA-C on 09/23/2009   Method used:   Print then Give to Patient   RxID:   [id number] SPIRIVA HANDIHALER 18 MCG CAPS (TIOTROPIUM BROMIDE MONOHYDRATE) 1 inhalation daily   #1 x 5   Entered and Authorized by:   Tereso Newcomer PA-C   Signed by:   Tereso Newcomer PA-C on 09/23/2009   Method used:   Print then Give to Patient   RxID:   [id number]

## 2010-06-16 NOTE — Progress Notes (Signed)
Summary: Probable Biliary Colic  Phone Note Call from Patient   Summary of Call: pt says her back just started  hurting and she can not keep anything on her stomach... pt says she has read up on gall stones and that was symptoms that she read Initial call taken by: Arm[place] Shannon,  October 22, 2009 12:36 PM  Follow-up for Phone Call        Spoke with patient.  Having some back pain.  Radiating to her right shoulder blade.  Notes after eating.  Some nausea.  Did vomit once.  No fevers.  Advised her to avoid greasy, fatty foods.  Will send in phenergan rx to use as needed.  Surgical referral pending.  She knows to go to the ED if she develops worsening symptoms. Follow-up by: Tereso Newcomer PA-C,  October 22, 2009 1:17 PM    New/Updated Medications: PROMETHAZINE HCL 25 MG TABS (PROMETHAZINE HCL) Take 1 by mouth every 8 hours as needed for nausea and vomiting. Prescriptions: PROMETHAZINE HCL 25 MG TABS (PROMETHAZINE HCL) Take 1 by mouth every 8 hours as needed for nausea and vomiting.  #20 x 1   Entered and Authorized by:   Tereso Newcomer PA-C   Signed by:   Tereso Newcomer PA-C on 10/22/2009   Method used:   Faxed to ...       [facility] Department (retail)       [address] [place], [place]  [address]       Ph: [phone]       Fax: [phone]   RxID:   [id number]

## 2010-06-16 NOTE — Letter (Signed)
Summary: *Referral Letter  [facility]  [address] [place], [place] [address]   Phone: [phone]  Fax: [phone]    10/20/2009  Thank you in advance for agreeing to see my patient:  [name] [address] [place], [place]  [address]  Phone: [phone]  Reason for Referral: 55 yo woman with h/o persistently elevated ALP (130-150).  Recent labs demonstrated elevated GGT as well.  Follow up ultrasound demonstrates 1.6 cm gallstone.  Patient now reporting frequent symptoms of RUQ pain often worse with fatty meals.  Please evaluate.  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) 2)  LIVER FUNCTION TESTS, ABNORMAL, HX OF (ICD-V12.2) 3)  VIRAL URI (ICD-465.9) 4)  SHORTNESS OF BREATH (ICD-786.05) 5)  SEBORRHEA (ICD-706.3) 6)  EDEMA (ICD-782.3) 7)  INSOMNIA (ICD-780.52) 8)  WEIGHT GAIN (ICD-783.1) 9)  ACUTE FRONTAL SINUSITIS (ICD-461.1) 10)  ACUTE BRONCHITIS (ICD-466.0) 11)  ACTINIC KERATOSIS (ICD-702.0) 12)  VITAMIN D DEFICIENCY (ICD-268.9) 13)  ALLERGIC RHINITIS (ICD-477.9) 14)  DEPRESSION (ICD-311) 15)  IRRITABLE BOWEL SYNDROME (ICD-564.1) 16)  CONSTIPATION (ICD-564.00) 17)  BENIGN POSITIONAL VERTIGO, HX OF (ICD-V12.49) 18)  TOBACCO USE, QUIT (ICD-V15.82) 19)  PREVENTIVE HEALTH CARE (ICD-V70.0) 20)  GERD (ICD-530.81) 21)  DYSLIPIDEMIA (ICD-272.4) 22)  ESSENTIAL HYPERTENSION, BENIGN (ICD-401.1)   Current Medications: 1)  PROTONIX 40 MG TBEC (PANTOPRAZOLE SODIUM) Take 1 tablet by mouth once daily 2)  SIMVASTATIN 40 MG TABS (SIMVASTATIN) Take 1 tab by mouth at bedtime for cholesterol 3)  METOPROLOL TARTRATE 25 MG TABS (METOPROLOL TARTRATE) one tab twice by mouth every day 4)  VITAMIN D3 400 UNIT TABS (CHOLECALCIFEROL) Take 2 tablets by mouth once daily.  Do not fill for 3 months (patient to complete 12 weeks of Vitamin D2 first). 5)  MECLIZINE HCL 25 MG TABS (MECLIZINE HCL) take one tablet by mouth every  6-8 hours as needed for dizziness *Amada Jupiter, MD 6)  DULCOLAX 10 MG SUPP (BISACODYL) 1 supp PR once daily as needed for constipation 7)  LISINOPRIL-HYDROCHLOROTHIAZIDE 20-25 MG TABS (LISINOPRIL-HYDROCHLOROTHIAZIDE) Take 1 tablet by mouth once a day for blood pressure 8)  ZYRTEC ALLERGY 10 MG TABS (CETIRIZINE HCL) Take 1 tablet by mouth once a day as needed for allergies 9)  PROMETHAZINE HCL 25 MG TABS (PROMETHAZINE HCL) 1 by mouth q 8 hours as needed for nausea and vomiting 10)  TRAZODONE HCL 100 MG TABS (TRAZODONE HCL) Take 1 tab by mouth at bedtime as needed for sleep 11)  TESSALON PERLES 100 MG CAPS (BENZONATATE) Take 1 tablet by mouth three times a day as needed for cough 12)  PROVENTIL HFA 108 (90 BASE) MCG/ACT AERS (ALBUTEROL SULFATE) 1-2 puffs every 4-6 hours as needed for cough or wheezing 13)  ERGOCALCIFEROL 50000 UNIT CAPS (ERGOCALCIFEROL) 1 by mouth once a week for 12 weeks 14)  SPIRIVA HANDIHALER 18 MCG CAPS (TIOTROPIUM BROMIDE MONOHYDRATE) 1 inhalation daily   Past Medical History: 1)  Current Problems:  2)  GERD (ICD-530.81) 3)  DYSLIPIDEMIA (ICD-272.4) 4)  ESSENTIAL HYPERTENSION, BENIGN (ICD-401.1) 5)  Normal Coronaries by Cardiac Cath. 12/18/2008 (EF 60%; runs of PACs during procedure - beta blocker recommended) 6)  Echo 10/2009: EF 60-65%; Grade 2 Diastolic Dysfunction 7)  Irritable Bowel Syndrome 8)  Depression 9)  Vitamin D Deficiency 10)  Benign Paroxysmal Positional Vertigo   Prior History of Blood Transfusions:   Pertinent Labs:    Thank you again for agreeing to see our patient; please contact us if you have any further questions or  need additional information.  Sincerely,  Tereso Newcomer PA-C

## 2010-06-16 NOTE — Progress Notes (Signed)
Summary: Office Visit/DEPRESSION SCREENING  Office Visit/DEPRESSION SCREENING   Imported By: Arta Bruce 05/18/2009 14:59:40  _____________________________________________________________________  External Attachment:    Type:   Image     Comment:   External Document

## 2010-06-16 NOTE — Letter (Signed)
Summary: DENTAL REFERRAL  DENTAL REFERRAL   Imported By: Arta Bruce 12/09/2009 12:21:00  _____________________________________________________________________  External Attachment:    Type:   Image     Comment:   External Document

## 2010-06-16 NOTE — Progress Notes (Signed)
Summary: WANTS LAB RESULTS  Phone Note Call from Patient Call back at Home Phone [phone]   Reason for Call: Lab or Test Results Summary of Call: [name] PT.  MS OCONNOR CALLED AND WOULD LIKE FOR SOMEONE TO CALL HER WITH HER LAB RESULTS AS SOON AS YOU CAN. Initial call taken by: Leodis Rains,  June 11, 2009 10:57 AM  Follow-up for Phone Call        forward to provider and will contact pt after results are reviewed. Follow-up by: Mikey College CMA,  June 11, 2009 3:36 PM  Additional Follow-up for Phone Call Additional follow up Details #1::        see lab comments from 06/10/2009 Med. sent to [facility] pharmacy Additional Follow-up by: Tereso Newcomer PA-C,  June 14, 2009 11:20 AM    Additional Follow-up for Phone Call Additional follow up Details #2::    pt is aware Follow-up by: [place] Shannon,  June 15, 2009 12:08 PM

## 2010-06-17 NOTE — Letter (Signed)
Summary: PFT'S  PFT'S   Imported By: Arta Bruce 01/24/2010 15:57:03  _____________________________________________________________________  External Attachment:    Type:   Image     Comment:   External Document

## 2010-07-31 LAB — DIFFERENTIAL
Basophils Absolute: 0 10*3/uL (ref 0.0–0.1)
Basophils Relative: 0 % (ref 0–1)
Eosinophils Absolute: 0.1 10*3/uL (ref 0.0–0.7)
Monocytes Absolute: 0.7 10*3/uL (ref 0.1–1.0)
Neutro Abs: 6.2 10*3/uL (ref 1.7–7.7)
Neutrophils Relative %: 72 % (ref 43–77)

## 2010-07-31 LAB — COMPREHENSIVE METABOLIC PANEL
ALT: 23 U/L (ref 0–35)
Albumin: 3.8 g/dL (ref 3.5–5.2)
Alkaline Phosphatase: 122 U/L — ABNORMAL HIGH (ref 39–117)
BUN: 6 mg/dL (ref 6–23)
Chloride: 100 mEq/L (ref 96–112)
Glucose, Bld: 128 mg/dL — ABNORMAL HIGH (ref 70–99)
Potassium: 3.3 mEq/L — ABNORMAL LOW (ref 3.5–5.1)
Sodium: 136 mEq/L (ref 135–145)
Total Bilirubin: 0.6 mg/dL (ref 0.3–1.2)

## 2010-07-31 LAB — CBC
HCT: 38.7 % (ref 36.0–46.0)
Hemoglobin: 13.1 g/dL (ref 12.0–15.0)
WBC: 8.7 10*3/uL (ref 4.0–10.5)

## 2010-07-31 LAB — POCT URINALYSIS DIP (DEVICE)
Bilirubin Urine: NEGATIVE
Glucose, UA: NEGATIVE mg/dL
Specific Gravity, Urine: 1.01 (ref 1.005–1.030)
Urobilinogen, UA: 0.2 mg/dL (ref 0.0–1.0)

## 2010-07-31 LAB — URINE CULTURE: Colony Count: >=100000

## 2010-08-21 LAB — CARDIAC PANEL(CRET KIN+CKTOT+MB+TROPI)
CK, MB: 0.5 ng/mL (ref 0.3–4.0)
CK, MB: 0.5 ng/mL (ref 0.3–4.0)
Relative Index: INVALID (ref 0.0–2.5)
Relative Index: INVALID (ref 0.0–2.5)
Total CK: 33 U/L (ref 7–177)
Troponin I: 0.01 ng/mL (ref 0.00–0.06)
Troponin I: 0.01 ng/mL (ref 0.00–0.06)

## 2010-08-21 LAB — POCT CARDIAC MARKERS
CKMB, poc: <1 ng/mL — ABNORMAL LOW (ref 1.0–8.0)
Myoglobin, poc: 38.9 ng/mL (ref 12–200)

## 2010-08-21 LAB — CBC
Hemoglobin: 13.7 g/dL (ref 12.0–15.0)
MCHC: 34.3 g/dL (ref 30.0–36.0)
MCV: 88 fL (ref 78.0–100.0)
Platelets: 232 10*3/uL (ref 150–400)
Platelets: 260 10*3/uL (ref 150–400)
RBC: 4.08 MIL/uL (ref 3.87–5.11)
RDW: 12.1 % (ref 11.5–15.5)
WBC: 6.9 10*3/uL (ref 4.0–10.5)

## 2010-08-21 LAB — BASIC METABOLIC PANEL
CO2: 28 mEq/L (ref 19–32)
Calcium: 9.2 mg/dL (ref 8.4–10.5)
Creatinine, Ser: 0.7 mg/dL (ref 0.4–1.2)
GFR calc non Af Amer: >60 mL/min (ref >60)
Glucose, Bld: 107 mg/dL — ABNORMAL HIGH (ref 70–99)
Sodium: 139 mEq/L (ref 135–145)

## 2010-08-21 LAB — COMPREHENSIVE METABOLIC PANEL
ALT: 17 U/L (ref 0–35)
AST: 19 U/L (ref 0–37)
Albumin: 3.2 g/dL — ABNORMAL LOW (ref 3.5–5.2)
CO2: 26 mEq/L (ref 19–32)
Chloride: 102 mEq/L (ref 96–112)
Creatinine, Ser: 0.69 mg/dL (ref 0.4–1.2)
GFR calc Af Amer: >60 mL/min (ref >60)
GFR calc non Af Amer: >60 mL/min (ref >60)
Sodium: 134 mEq/L — ABNORMAL LOW (ref 135–145)
Total Bilirubin: 0.6 mg/dL (ref 0.3–1.2)

## 2010-08-21 LAB — CK TOTAL AND CKMB (NOT AT ARMC): Total CK: 48 U/L (ref 7–177)

## 2010-08-29 LAB — URINE MICROSCOPIC-ADD ON

## 2010-08-29 LAB — URINALYSIS, ROUTINE W REFLEX MICROSCOPIC
Bilirubin Urine: NEGATIVE
Glucose, UA: NEGATIVE mg/dL
Hgb urine dipstick: NEGATIVE
Specific Gravity, Urine: <1.005 — ABNORMAL LOW (ref 1.005–1.030)
pH: 6.5 (ref 5.0–8.0)

## 2010-08-29 LAB — DIFFERENTIAL
Lymphs Abs: 1.3 10*3/uL (ref 0.7–4.0)
Monocytes Absolute: 0.6 10*3/uL (ref 0.1–1.0)
Monocytes Relative: 7 % (ref 3–12)
Neutro Abs: 5.8 10*3/uL (ref 1.7–7.7)
Neutrophils Relative %: 75 % (ref 43–77)

## 2010-08-29 LAB — BASIC METABOLIC PANEL
CO2: 28 mEq/L (ref 19–32)
Calcium: 9 mg/dL (ref 8.4–10.5)
Chloride: 105 mEq/L (ref 96–112)
Creatinine, Ser: 0.65 mg/dL (ref 0.4–1.2)
GFR calc Af Amer: >60 mL/min (ref >60)
Sodium: 139 mEq/L (ref 135–145)

## 2010-08-29 LAB — CBC
Hemoglobin: 14.1 g/dL (ref 12.0–15.0)
RBC: 4.85 MIL/uL (ref 3.87–5.11)
WBC: 7.7 10*3/uL (ref 4.0–10.5)

## 2010-09-27 NOTE — Consult Note (Signed)
NAMEANNALENA, PIATT NO.:  [id number]   MEDICAL RECORD NO.:  [id number]          PATIENT TYPE:  INP   LOCATION:  3728                         FACILITY:  [facility]   PHYSICIAN:  Doylene Canning. Ladona Ridgel, MD    DATE OF BIRTH:  Jul 31, 1955   DATE OF CONSULTATION:  12/16/2008  DATE OF DISCHARGE:                                 CONSULTATION   INDICATION FOR CONSULTATION:  Evaluation of chest pain in a patient with  multiple cardiac risk factors.   HISTORY OF PRESENT ILLNESS:  The patient is a 55 year old woman who has  a strong family history of coronary artery disease and sudden cardiac  death.  She has a history of hypertension and dyslipidemia.  Over the  last several weeks, she has noted increasing fatigue and weakness.  On  the day of admission (December 16, 2008), the patient experienced  substernal chest pain which radiated into the left arm.  There was  pressure sensation.  It was judged as 4/10 in intensity and she had some  diaphoresis with this.  She was treated with nitroglycerin in the  emergency department and her pain improved markedly.  There is no  associated nausea, vomiting, fevers, or chills.  She had dizziness, but  did not have frank syncope.  She is admitted for additional evaluation.  Her initial cardiac enzymes demonstrated no acute myocardial infarction.  Her EKG was abnormal, minimally so and she is now referred for  additional evaluation.   PAST MEDICAL HISTORY:  As noted above.  She also has gastroesophageal  reflux disease.   PAST SURGICAL HISTORY:  Notable for appendectomy many years ago.   SOCIAL HISTORY:  The patient is unemployed.  She smokes 1-1/2 packs of  cigarettes per day and has done so for almost 20 years.  She denies  recreational drug or alcohol use.   FAMILY HISTORY:  Notable for a brother dying suddenly at age 61 of an  MI.  Her father had longstanding coronary artery disease, MIs, and VT.  Her mother has coronary artery disease.   REVIEW OF SYSTEMS:  As noted in the HPI, otherwise all systems reviewed  and negative except as noted above.   PHYSICAL EXAMINATION:  GENERAL:  She is a pleasant middle-aged woman.  She looks somewhat older than her stated age.  VITAL SIGNS:  Her blood pressure was 130/70.  The pulse was 80 and  regular.  Respirations were 18.  Temperature was 90.  HEENT:  Normocephalic and atraumatic.  Pupils are equal and round.  Oropharynx moist.  Sclerae anicteric.  NECK:  No jugular venous distention.  No thyromegaly.  Trachea is  midline.  Carotids are 2+ and symmetric.  I did not appreciate a bruit.  LUNGS:  Clear bilaterally to auscultation.  No wheezes, rales, or  rhonchi were present.  There was no increased work of breathing.  CARDIAC:  Regular rate and rhythm.  Normal S1 and S2.  There were no  murmurs, rubs, or gallops.  The PMI was not enlarged nor was it  laterally displaced.  ABDOMEN:  Soft and nontender.  There  was no organomegaly.  There was no  rebound or guarding.  Bowel sounds were present.  EXTREMITIES:  No cyanosis, clubbing, or edema.  Pulses were 2+ and  symmetric.  NEUROLOGIC:  Alert and oriented x3.  Cranial nerves are intact.  Strength is 5/5 and symmetric.   EKG demonstrates sinus rhythm with poor R-wave progression.  There were  nondiagnostic Q-waves in leads III and aVF.   LABORATORY DATA:  Hemoglobin of 13, creatinine of 0.7, and normal  platelet count.   IMPRESSION:  1. Chest pain, worrisome for unstable angina in a patient with      multiple cardiac risk factors.  2. Hypertension.  3. Dyslipidemia.  4. Ongoing tobacco abuse.   DISCUSSION:  I discussed the treatment options with the patient and her  family who is with her today.  The risks, benefits, goals, and  expectations of left heart catheterization have been discussed with the  patient and she wishes to proceed.  This will be scheduled as early as  possible convenient time.      Doylene Canning. Ladona Ridgel, MD   Electronically Signed     GWT/MEDQ  D:  12/17/2008  T:  12/18/2008  Job:  [id number]

## 2010-09-27 NOTE — H&P (Signed)
NAMEMarland Kitchen  MEILING, HENDRIKS NO.:  [id number]   MEDICAL RECORD NO.:  [id number]          PATIENT TYPE:  INP   LOCATION:  3728                         FACILITY:  [facility]   PHYSICIAN:  Joylene John, MD       DATE OF BIRTH:  1955/09/21   DATE OF ADMISSION:  12/16/2008  DATE OF DISCHARGE:                              HISTORY & PHYSICAL   The patient is being admitted on December 16, 2008, to [facility]   REASON FOR ADMISSION:  Chest pain.   HISTORY OF PRESENT ILLNESS:  This is a 55 year old Caucasian female with  history of hypertension, hyperlipidemia, coming in with chest pain.  The  pain started earlier this morning according to the patient while she was  at rest and lasted for 1-2 hours.  Pain was left sided, achy, and  pressured in nature, 3-4 on a scale of 10 in intensity associated with  some dizziness and left arm tingling and diaphoresis.  The patient still  continues to have some tingling and pain.  However, is not dizzy  anymore.  Denies any similar episodes in the past.  Denies any nausea,  vomiting, fevers, chills, or change in exercise tolerance.  No  aggravating or relieving factors.  Pain was not relieved by sublingual  nitro.   PAST MEDICAL HISTORY:  1. Hypertension.  2. Hyperlipidemia.  3. GERD.   FAMILY HISTORY:  Coronary artery disease in siblings and parents.   SOCIAL HISTORY:  She is not working, smokes one and a half to two pack  per day for more than 20 years.  No alcohol or drugs.   ALLERGIES:  CODEINE causes nausea and vomiting.   PHYSICAL EXAMINATION:  VITAL SIGNS:  Temperature of 98.9, blood pressure  130/77 to 154/77, pulse is 78-87, respiration is 11-22, O2 sat is 97-  99%.  GENERAL:  The patient is in no acute distress, awake and alert.  A and O  x3.  CARDIOVASCULAR:  Regular rate and rhythm.  LUNGS:  Clear to auscultation anteriorly.  However, on the left base,  she does have crackles.  EXTREMITIES:  Lower  extremity, no edema appreciated.   LABORATORY DATA:  Chest x-ray shows chronic bronchitic changes likely  associated with smoking.  First set of cardiac enzymes is negative.  Hemoglobin 13.7, crit 40.1, platelets 260, white count 7.5.  Sodium 139,  potassium 3.8, chloride 103, bicarb 28, BUN 4, creatinine 0.7, glucose  107, calcium is 9.2.  EKG is normal sinus rhythm at 75 beats per minute.  No new changes noted when compared with the old EKG.   ASSESSMENT AND PLAN:  This is a 55 year old female with hypertension,  hyperlipidemia, positive coronary artery disease in the family coming in  with left-sided chest pain.  Plan is to admit the patient to a telemetry  bed to do serial cardiac enzymes to rule her out.  We will repeat the  labs in the morning along with the EKG in the morning to see if there  are any new changes.  We will continue her Zocor and lisinopril.  We  will add a low-dose beta-blocker  and aspirin, will also have sublingual nitro for p.r.n. chest pain.  The  patient does not have a PCP on the outside though depending on how the  patient does over the next 24 hours, a decision will need to be made.  If the patient needs to be continued inpatient to have workup or if  outpatient workup can be set up.      Joylene John, MD  Electronically Signed     RP/MEDQ  D:  12/16/2008  T:  12/17/2008  Job:  [id number]

## 2010-09-27 NOTE — Cardiovascular Report (Signed)
NAMEMarland Kitchen  Catherine, Rivera NO.:  [id number]   MEDICAL RECORD NO.:  [id number]          PATIENT TYPE:  INP   LOCATION:  3728                         FACILITY:  [facility]   PHYSICIAN:  Marca Ancona, MD      DATE OF BIRTH:  Jul 09, 1955   DATE OF PROCEDURE:  12/18/2008  DATE OF DISCHARGE:  12/18/2008                            CARDIAC CATHETERIZATION   PROCEDURES:  1. Left heart catheterization.  2. Coronary angiography.  3. Left ventriculography.   SURGEON:  Marca Ancona, MD   INDICATIONS:  Unstable angina type symptoms in a 55 year old patient  with a strong family history of premature coronary disease.   PROCEDURE NOTE:  After informed consent was obtained, the right groin  was sterilely prepped and draped.  Lidocaine 1% was used to locally  anesthetize the right groin area.  The right common femoral artery was  entered using Seldinger technique and a 5-French arterial sheath was  placed.  The left coronary artery was engaged using the 5-French  multipurpose catheter.  The left ventricle was entered using the 5-  [place] multipurpose catheter and the right coronary artery was engaged  using the 5-French JR-4 catheter.   FINDINGS:  1. Left ventriculography:  EF of 60%.  There is normal wall motion.      No significant mitral regurgitation.  2. Hemodynamics:  Aorta 127/72.  LV 129/12.  3. Right coronary artery:  The right coronary artery is dominant.      There is no angiographic coronary disease.  4. Left main:  The left main is short.  There is no angiographic      coronary disease.  5. Left circumflex system:  The left circumflex gives off a large      first obtuse marginal.  The remainder of the AV circumflex is a      very small vessel.  There is no angiographic coronary disease.  6. LAD system.  There is a small first diagonal and a moderate-sized      second diagonal.  There is no angiographic coronary disease.  7. The patient was noted to have occasional  PACs and short runs of      what appears to be atrial tachycardia.   IMPRESSION:  No angiographic coronary disease.  The patient was noted to  have frequent premature atrial contractions and short runs of possible  atrial tachycardia during the procedure.  Her symptoms maybe arrhythmia  related.  I would consider using a beta-blocker in this patient.      Marca Ancona, MD  Electronically Signed     DM/MEDQ  D:  12/18/2008  T:  12/18/2008  Job:  [id number]

## 2010-09-27 NOTE — Discharge Summary (Signed)
[name], [name] NO.:  [id number]   MEDICAL RECORD NO.:  [id number]          PATIENT TYPE:  INP   LOCATION:  3728                         FACILITY:  [facility]   PHYSICIAN:  Beckey Rutter, MD  DATE OF BIRTH:  09-30-1955   DATE OF ADMISSION:  12/16/2008  DATE OF DISCHARGE:  12/18/2008                               DISCHARGE SUMMARY   PRIMARY CARE PHYSICIAN:  She goes to [facility].   CHIEF COMPLAINT:  Chest pain.   BRIEF HISTORY OF PRESENT ILLNESS:  A 55 year old Caucasian female  presented with history of hypertension, hyperlipidemia, presented with  chest pain.   HOSPITAL COURSE:  During hospital course, the patient was seen and  evaluated by Cardiology.  The patient noticed to have Q-waves in lead  II, III, and aVF, which is new to her.  The patient undergone cardiac  catheterization, which is essentially negative.  The recommendation of  Cardiology is to consider beta-blocker for that reason and also start  the patient on metoprolol.  I will decrease the dose of lisinopril and  continue the statins prior to admission.   DISCHARGE MEDICATIONS:  1. Metoprolol 25 mg p.o. b.i.d.  2. Kapidex 30 mg daily.  3. Zocor 20 mg daily.  4. Lisinopril 5 mg daily.   DISCHARGE DIAGNOSES:  1. Hypertension.  2. Hyperlipidemia.  3. Chest pain, ruled out for acute coronary syndrome.  4. Gastroesophageal reflux disease.   DISCHARGE PLAN:  The patient is stable for discharge today.  She was  advised to follow up with her primary physician within 1-2 weeks.  Prescription for metoprolol and nicotine patch was given.      Beckey Rutter, MD  Electronically Signed     EME/MEDQ  D:  12/18/2008  T:  12/19/2008  Job:  [id number]

## 2010-11-25 ENCOUNTER — Other Ambulatory Visit: Payer: Self-pay | Admitting: Internal Medicine

## 2010-11-25 DIAGNOSIS — R1010 Upper abdominal pain, unspecified: Secondary | ICD-10-CM

## 2010-11-28 ENCOUNTER — Ambulatory Visit
Admission: RE | Admit: 2010-11-28 | Discharge: 2010-11-28 | Disposition: A | Discharge status: Home / Self Care | Payer: BC Managed Care – PPO | Source: Ambulatory Visit | Attending: Internal Medicine | Admitting: Internal Medicine

## 2010-11-28 DIAGNOSIS — R1010 Upper abdominal pain, unspecified: Secondary | ICD-10-CM

## 2011-10-11 ENCOUNTER — Other Ambulatory Visit (HOSPITAL_COMMUNITY): Payer: Self-pay | Admitting: Internal Medicine

## 2011-10-11 DIAGNOSIS — Z1231 Encounter for screening mammogram for malignant neoplasm of breast: Secondary | ICD-10-CM

## 2011-10-11 DIAGNOSIS — M8580 Other specified disorders of bone density and structure, unspecified site: Secondary | ICD-10-CM

## 2011-11-03 ENCOUNTER — Ambulatory Visit (HOSPITAL_COMMUNITY)
Admission: RE | Admit: 2011-11-03 | Discharge: 2011-11-03 | Disposition: A | Discharge status: Home / Self Care | Payer: BC Managed Care – PPO | Source: Ambulatory Visit | Attending: Internal Medicine | Admitting: Internal Medicine

## 2011-11-03 DIAGNOSIS — M8580 Other specified disorders of bone density and structure, unspecified site: Secondary | ICD-10-CM

## 2011-11-03 DIAGNOSIS — Z13820 Encounter for screening for osteoporosis: Secondary | ICD-10-CM | POA: Insufficient documentation

## 2011-11-03 DIAGNOSIS — Z1231 Encounter for screening mammogram for malignant neoplasm of breast: Secondary | ICD-10-CM

## 2012-02-28 ENCOUNTER — Other Ambulatory Visit: Payer: Self-pay | Admitting: Dermatology

## 2012-03-11 ENCOUNTER — Other Ambulatory Visit: Payer: Self-pay | Admitting: Internal Medicine

## 2012-03-11 ENCOUNTER — Ambulatory Visit
Admission: RE | Admit: 2012-03-11 | Discharge: 2012-03-11 | Disposition: A | Discharge status: Home / Self Care | Payer: BC Managed Care – PPO | Source: Ambulatory Visit | Attending: Internal Medicine | Admitting: Internal Medicine

## 2012-03-11 DIAGNOSIS — R05 Cough: Secondary | ICD-10-CM

## 2012-03-11 DIAGNOSIS — R059 Cough, unspecified: Secondary | ICD-10-CM

## 2012-09-10 ENCOUNTER — Other Ambulatory Visit (HOSPITAL_COMMUNITY)
Admission: RE | Admit: 2012-09-10 | Discharge: 2012-09-10 | Disposition: A | Discharge status: Home / Self Care | Payer: BC Managed Care – PPO | Source: Ambulatory Visit | Attending: Internal Medicine | Admitting: Internal Medicine

## 2012-09-10 DIAGNOSIS — Z01419 Encounter for gynecological examination (general) (routine) without abnormal findings: Secondary | ICD-10-CM | POA: Insufficient documentation

## 2012-11-11 ENCOUNTER — Other Ambulatory Visit (HOSPITAL_COMMUNITY): Payer: Self-pay | Admitting: Internal Medicine

## 2012-11-11 DIAGNOSIS — Z1231 Encounter for screening mammogram for malignant neoplasm of breast: Secondary | ICD-10-CM

## 2012-11-12 ENCOUNTER — Ambulatory Visit (HOSPITAL_COMMUNITY)
Admission: RE | Admit: 2012-11-12 | Discharge: 2012-11-12 | Disposition: A | Discharge status: Home / Self Care | Payer: BC Managed Care – PPO | Source: Ambulatory Visit | Attending: Internal Medicine | Admitting: Internal Medicine

## 2012-11-12 DIAGNOSIS — Z1231 Encounter for screening mammogram for malignant neoplasm of breast: Secondary | ICD-10-CM | POA: Insufficient documentation

## 2013-10-29 ENCOUNTER — Other Ambulatory Visit: Payer: Self-pay

## 2013-12-10 ENCOUNTER — Other Ambulatory Visit (HOSPITAL_COMMUNITY): Payer: Self-pay | Admitting: Internal Medicine

## 2013-12-10 DIAGNOSIS — Z1231 Encounter for screening mammogram for malignant neoplasm of breast: Secondary | ICD-10-CM

## 2013-12-17 ENCOUNTER — Other Ambulatory Visit (HOSPITAL_COMMUNITY): Payer: Self-pay | Admitting: Internal Medicine

## 2013-12-17 DIAGNOSIS — M810 Age-related osteoporosis without current pathological fracture: Secondary | ICD-10-CM

## 2013-12-18 ENCOUNTER — Ambulatory Visit (HOSPITAL_COMMUNITY)
Admission: RE | Admit: 2013-12-18 | Discharge: 2013-12-18 | Disposition: A | Discharge status: Home / Self Care | Payer: BC Managed Care – PPO | Source: Ambulatory Visit | Attending: Internal Medicine | Admitting: Internal Medicine

## 2013-12-18 DIAGNOSIS — Z780 Asymptomatic menopausal state: Secondary | ICD-10-CM | POA: Insufficient documentation

## 2013-12-18 DIAGNOSIS — M810 Age-related osteoporosis without current pathological fracture: Secondary | ICD-10-CM

## 2013-12-18 DIAGNOSIS — Z13820 Encounter for screening for osteoporosis: Secondary | ICD-10-CM | POA: Insufficient documentation

## 2013-12-18 DIAGNOSIS — Z1231 Encounter for screening mammogram for malignant neoplasm of breast: Secondary | ICD-10-CM

## 2014-08-27 ENCOUNTER — Other Ambulatory Visit (HOSPITAL_COMMUNITY): Payer: Self-pay | Admitting: Internal Medicine

## 2014-08-27 DIAGNOSIS — R202 Paresthesia of skin: Secondary | ICD-10-CM

## 2014-09-01 ENCOUNTER — Ambulatory Visit (HOSPITAL_COMMUNITY)
Admission: RE | Admit: 2014-09-01 | Discharge: 2014-09-01 | Disposition: A | Discharge status: Home / Self Care | Payer: BLUE CROSS/BLUE SHIELD | Source: Ambulatory Visit | Attending: Internal Medicine | Admitting: Internal Medicine

## 2014-09-01 DIAGNOSIS — M79604 Pain in right leg: Secondary | ICD-10-CM | POA: Diagnosis not present

## 2014-09-01 DIAGNOSIS — R202 Paresthesia of skin: Secondary | ICD-10-CM | POA: Diagnosis not present

## 2014-09-01 NOTE — Progress Notes (Signed)
VASCULAR LAB PRELIMINARY  ARTERIAL  ABI completed:    RIGHT    LEFT    PRESSURE WAVEFORM  PRESSURE WAVEFORM  BRACHIAL 140 Triphasic BRACHIAL 132 Triphasic  DP 158 Triphasic DP 140 Triphasic  PT 155 Triphasic PT 149 Triphasic    RIGHT LEFT  ABI 1.13 1.06     Valoria Tamburri, RVT 09/01/2014, 6:33 PM

## 2015-11-30 DIAGNOSIS — F419 Anxiety disorder, unspecified: Secondary | ICD-10-CM | POA: Diagnosis not present

## 2015-11-30 DIAGNOSIS — E7800 Pure hypercholesterolemia, unspecified: Secondary | ICD-10-CM | POA: Diagnosis not present

## 2015-11-30 DIAGNOSIS — G4700 Insomnia, unspecified: Secondary | ICD-10-CM | POA: Diagnosis not present

## 2015-11-30 DIAGNOSIS — I10 Essential (primary) hypertension: Secondary | ICD-10-CM | POA: Diagnosis not present

## 2015-12-06 NOTE — Progress Notes (Signed)
This encounter was created in error - please disregard.

## 2016-01-09 ENCOUNTER — Observation Stay (HOSPITAL_COMMUNITY)
Admit: 2016-01-09 | Discharge: 2016-01-09 | Disposition: A | Discharge status: Home / Self Care | Payer: BLUE CROSS/BLUE SHIELD | Attending: Internal Medicine | Admitting: Internal Medicine

## 2016-01-09 ENCOUNTER — Emergency Department (HOSPITAL_COMMUNITY): Payer: BLUE CROSS/BLUE SHIELD

## 2016-01-09 ENCOUNTER — Observation Stay (HOSPITAL_COMMUNITY)
Admission: EM | Admit: 2016-01-09 | Discharge: 2016-01-09 | Disposition: A | Discharge status: Home / Self Care | Payer: BLUE CROSS/BLUE SHIELD | Attending: Internal Medicine | Admitting: Internal Medicine

## 2016-01-09 ENCOUNTER — Encounter (HOSPITAL_COMMUNITY): Payer: Self-pay | Admitting: Emergency Medicine

## 2016-01-09 ENCOUNTER — Ambulatory Visit (HOSPITAL_COMMUNITY)
Admit: 2016-01-09 | Discharge: 2016-01-09 | Disposition: A | Discharge status: Home / Self Care | Payer: BLUE CROSS/BLUE SHIELD | Attending: Internal Medicine | Admitting: Internal Medicine

## 2016-01-09 DIAGNOSIS — E785 Hyperlipidemia, unspecified: Secondary | ICD-10-CM | POA: Insufficient documentation

## 2016-01-09 DIAGNOSIS — Z79899 Other long term (current) drug therapy: Secondary | ICD-10-CM | POA: Insufficient documentation

## 2016-01-09 DIAGNOSIS — R079 Chest pain, unspecified: Secondary | ICD-10-CM | POA: Diagnosis not present

## 2016-01-09 DIAGNOSIS — E876 Hypokalemia: Secondary | ICD-10-CM | POA: Insufficient documentation

## 2016-01-09 DIAGNOSIS — Z8249 Family history of ischemic heart disease and other diseases of the circulatory system: Secondary | ICD-10-CM | POA: Diagnosis not present

## 2016-01-09 DIAGNOSIS — R0789 Other chest pain: Secondary | ICD-10-CM | POA: Diagnosis not present

## 2016-01-09 DIAGNOSIS — I10 Essential (primary) hypertension: Secondary | ICD-10-CM | POA: Insufficient documentation

## 2016-01-09 HISTORY — DX: Pure hypercholesterolemia, unspecified: E78.00

## 2016-01-09 HISTORY — DX: Essential (primary) hypertension: I10

## 2016-01-09 HISTORY — DX: Gastro-esophageal reflux disease without esophagitis: K21.9

## 2016-01-09 LAB — NM MYOCAR SINGLE W/SPECT
CHL CUP RESTING HR STRESS: 69 {beats}/min
CSEPED: 8 min
CSEPEW: 1 METS
CSEPHR: 82 %
Exercise duration (sec): 30 s
MPHR: 159 {beats}/min
Peak HR: 131 {beats}/min

## 2016-01-09 LAB — CBC
HCT: 40.7 % (ref 36.0–46.0)
Hemoglobin: 14.1 g/dL (ref 12.0–15.0)
MCH: 27.9 pg (ref 26.0–34.0)
MCHC: 34.6 g/dL (ref 30.0–36.0)
MCV: 80.4 fL (ref 78.0–100.0)
PLATELETS: 307 10*3/uL (ref 150–400)
RBC: 5.06 MIL/uL (ref 3.87–5.11)
RDW: 12.8 % (ref 11.5–15.5)
WBC: 9 10*3/uL (ref 4.0–10.5)

## 2016-01-09 LAB — BASIC METABOLIC PANEL
Anion gap: 11 (ref 5–15)
BUN: 7 mg/dL (ref 6–20)
CHLORIDE: 99 mmol/L — AB (ref 101–111)
CO2: 23 mmol/L (ref 22–32)
CREATININE: 0.76 mg/dL (ref 0.44–1.00)
Calcium: 9.1 mg/dL (ref 8.9–10.3)
GFR calc non Af Amer: >60 mL/min (ref >60)
GLUCOSE: 144 mg/dL — AB (ref 65–99)
Potassium: 3.1 mmol/L — ABNORMAL LOW (ref 3.5–5.1)
Sodium: 133 mmol/L — ABNORMAL LOW (ref 135–145)

## 2016-01-09 LAB — TROPONIN I
Troponin I: 0.03 ng/mL (ref <0.03)
Troponin I: 0.03 ng/mL (ref <0.03)

## 2016-01-09 LAB — I-STAT TROPONIN, ED: Troponin i, poc: 0.01 ng/mL (ref 0.00–0.08)

## 2016-01-09 MED ORDER — ASPIRIN 81 MG PO CHEW
324.0000 mg | CHEWABLE_TABLET | Freq: Once | ORAL | Status: AC
  Administered 2016-01-09: 324 mg via ORAL
  Filled 2016-01-09: qty 4

## 2016-01-09 MED ORDER — REGADENOSON 0.4 MG/5ML IV SOLN: 0.4000 mg | Freq: Once | INTRAVENOUS | Status: DC

## 2016-01-09 MED ORDER — TECHNETIUM TC 99M TETROFOSMIN IV KIT
10.0000 | PACK | Freq: Once | INTRAVENOUS | Status: AC | PRN
  Administered 2016-01-09: 10 via INTRAVENOUS

## 2016-01-09 MED ORDER — ENOXAPARIN SODIUM 40 MG/0.4ML ~~LOC~~ SOLN
40.0000 mg | SUBCUTANEOUS | Status: DC
  Administered 2016-01-09: 40 mg via SUBCUTANEOUS
  Filled 2016-01-09: qty 0.4

## 2016-01-09 MED ORDER — REGADENOSON 0.4 MG/5ML IV SOLN
INTRAVENOUS | Status: AC
Start: 2016-01-09 — End: 2016-01-10
  Filled 2016-01-09: qty 5

## 2016-01-09 MED ORDER — POTASSIUM CHLORIDE CRYS ER 20 MEQ PO TBCR
40.0000 meq | EXTENDED_RELEASE_TABLET | Freq: Once | ORAL | Status: AC
  Administered 2016-01-09: 40 meq via ORAL
  Filled 2016-01-09: qty 2

## 2016-01-09 MED ORDER — ONDANSETRON HCL 4 MG/2ML IJ SOLN: 4.0000 mg | Freq: Four times a day (QID) | INTRAMUSCULAR | Status: DC | PRN

## 2016-01-09 MED ORDER — ACETAMINOPHEN 325 MG PO TABS
650.0000 mg | ORAL_TABLET | ORAL | Status: DC | PRN
  Administered 2016-01-09: 650 mg via ORAL
  Filled 2016-01-09: qty 2

## 2016-01-09 MED ORDER — TECHNETIUM TC 99M TETROFOSMIN IV KIT
30.0000 | PACK | Freq: Once | INTRAVENOUS | Status: AC | PRN
  Administered 2016-01-09: 30 via INTRAVENOUS

## 2016-01-09 NOTE — Discharge Summary (Signed)
Physician Discharge Summary  Catherine TERCERO [id number] DOB: 10-02-1955 DOA: 01/09/2016  PCP: Criselda Peaches, MD  Admit date: 01/09/2016 Discharge date: 01/09/2016  Recommendations for Outpatient Follow-up:  1. Pt will need to follow up with PCP in 1-2 weeks post discharge 2. Please obtain BMP to evaluate electrolytes and kidney function, K level   Discharge Diagnoses:  Principal Problem:   Chest pain, rule out acute myocardial infarction  Discharge Condition: Stable  Diet recommendation: Heart healthy diet discussed in details   History of present illness:  60 y.o. female with medical history significant of HTN, family history of CAD including sudden cardiac death from [place] in a brother at age 42, sister had MI at age 70.  Patient presents to the ED with c/o chest pain. No h/o stress test in past nor cards eval in past.  Hospital Course:  Principal Problem:   Chest pain, rule out acute myocardial infarction - no chest pain this AM, pt wants to go home today  - troponins 0.03 - stress test low risk - d/w cardiology PA on call, will have the office schedule outpatient follow up in next 1-2 weeks  - pt advised to call PCP and schedule follow up appointment as soon as possible as well - pt advised to go to ED if her CP re occurs   Hypokalemia - supplemented prior to discharge - needs follow up   Procedures/Studies: Dg Chest 2 View  Result Date: 01/09/2016 CLINICAL DATA:  Central chest pain and pressure since 12:30 a.m. Nausea. History of hypertension. Nonsmoker. EXAM: CHEST  2 VIEW COMPARISON:  03/11/2012 FINDINGS: The heart size and mediastinal contours are within normal limits. Both lungs are clear. The visualized skeletal structures are unremarkable. Surgical clips in the right upper quadrant. IMPRESSION: No active cardiopulmonary disease. Electronically Signed   By: Lucienne Capers M.D.   On: 01/09/2016 02:06     Discharge Exam: Vitals:   01/09/16 0520 01/09/16 0532   BP: 122/78 134/85  Pulse: 71 72  Resp: 19 20  Temp:     Vitals:   01/09/16 0149 01/09/16 0405 01/09/16 0520 01/09/16 0532  BP: 122/90 109/74 122/78 134/85  Pulse: 84 98 71 72  Resp: 16 13 19 20   Temp: 97.4 F (36.3 C)     TempSrc: Oral   Oral  SpO2: 97% 99% 98% 99%  Weight:  70.3 kg (155 lb)  82.6 kg (182 lb 3.2 oz)  Height:  5\' 1"  (1.549 m)  5\' 1"  (1.549 m)    General: Pt is alert, follows commands appropriately, not in acute distress Cardiovascular: Regular rate and rhythm, S1/S2 +, no rubs, no gallops Respiratory: Clear to auscultation bilaterally, no wheezing, no crackles, no rhonchi Abdominal: Soft, non tender, non distended, bowel sounds +, no guarding  Discharge Instructions     Medication List    TAKE these medications   DEXILANT PO Take 1 capsule by mouth every evening.   HALCION PO Take 1 tablet by mouth at bedtime.   HYDROCHLOROTHIAZIDE PO Take 1 tablet by mouth daily with breakfast.   LOSARTAN POTASSIUM PO Take 1 tablet by mouth every evening.   METOPROLOL TARTRATE PO Take 0.5 tablets by mouth every evening.   SIMVASTATIN PO Take 1 tablet by mouth every evening.      Follow-up Information    GREEN, Keenan Bachelor, MD .   Specialty:  Internal Medicine Contact information: [address] [place] [address] [phone]  The results of significant diagnostics from this hospitalization (including imaging, microbiology, ancillary and laboratory) are listed below for reference.     Microbiology: No results found for this or any previous visit (from the past 240 hour(s)).   Labs: Basic Metabolic Panel:  Recent Labs Lab 01/09/16 0241  NA 133*  K 3.1*  CL 99*  CO2 23  GLUCOSE 144*  BUN 7  CREATININE 0.76  CALCIUM 9.1   CBC:  Recent Labs Lab 01/09/16 0241  WBC 9.0  HGB 14.1  HCT 40.7  MCV 80.4  PLT 307   Cardiac Enzymes:  Recent Labs Lab 01/09/16 0556 01/09/16 0941  TROPONINI 0.03* 0.03*    SIGNED: Time coordinating discharge: 30 minutes  Faye Ramsay, MD  [facility] 01/09/2016, 1:08 PM Pager [phone]  If 7PM-7AM, please contact night-coverage [email or web address] Password [email or web address]

## 2016-01-09 NOTE — ED Triage Notes (Signed)
Pt from home with c/o central chest pressure that radiates to her left jaw. Pt states this began about an hour ago. Pt states she also has had increased urination over the past few hours and a headache. Pt states she has a history of hypertension and high cholesterol. Pt has strong bilateral radial pulses and normal heart sounds.

## 2016-01-09 NOTE — Progress Notes (Addendum)
CRITICAL VALUE ALERT  Critical value received:  Troponin - 0.03  Date of notification:  01/09/2016   Time of notification:  07:30  Critical value read back:Yes.    Nurse who received alert:  S. Danielle Dess  MD notified (1st page):  Doyle Askew  Time of first page:  [phone]  MD notified (2nd page):  Time of second page:  Responding MD:  Doyle Askew  Time MD responded:  0800

## 2016-01-09 NOTE — ED Provider Notes (Signed)
[facility] DEPT Provider Note   CSN: [id number] Arrival date & time: 01/09/16  0134  By signing my name below, I, Soijett Blue, attest that this documentation has been prepared under the direction and in the presence of Quintella Reichert, MD. Electronically Signed: Soijett Blue, ED Scribe. 01/09/16. 3:58 AM.   History   Chief Complaint Chief Complaint  Patient presents with  . Chest Pain    HPI  [name] is a 60 y.o. female with a medical hx of high cholesterol and HTN, who presents to the Emergency Department complaining of central CP onset 4 hours ago PTA. Pt notes that she was dozing off to sleep when the chest pain woke her. Pt reports that she initially felt a tightness sensation to her chest, heart palpitations, jaw pain, and HA prior to the occurrence of her CP. Pt states that her CP episode lasted 45 minutes. Pt denies having CP episodes prior to the onset of her current symptoms. Pt denies ever seeing a cardiologist or having a stress test in the past.   She states that she is having associated symptoms of heart palpitations, jaw pain, HA, SOB, non-productive cough, urinary frequency, and fatigue. She states that she has not tried any medications for the relief of her symptoms. She denies abdominal pain, n/v, leg swelling, dysuria, and any other symptoms. Denies smoking cigarettes, drinking ETOH, or illegal drug use. Pt denies estrogen use, PMHx of blood clots, smoking cigarettes, drinking ETOH, or illegal drug use. Pt notes that her sister has a hx of A-fib and that her father was 27 years old with his first MI. Pt brother was 41 when he died from a MI.     The history is provided by the patient. No language interpreter was used.    Past Medical History:  Diagnosis Date  . GERD (gastroesophageal reflux disease)   . High cholesterol   . Hypertension     Patient Active Problem List   Diagnosis Date Noted  . Chest pain, rule out acute myocardial infarction  01/09/2016  . CLOSED FRACTURE OF UNSPECIFIED PART OF FIBULA 02/15/2010  . FACIAL RASH 01/26/2010  . COUGH 01/26/2010  . VOMITING 01/26/2010  . MOTION SICKNESS 12/08/2009  . CHOLECYSTECTOMY, LAPAROSCOPIC, HX OF 12/08/2009  . CHOLELITHIASIS 10/20/2009  . LIVER FUNCTION TESTS, ABNORMAL, HX OF 10/03/2009  . VIRAL URI 09/23/2009  . SEBORRHEA 09/23/2009  . SHORTNESS OF BREATH 09/23/2009  . INSOMNIA 07/21/2009  . EDEMA 07/21/2009  . ACUTE FRONTAL SINUSITIS 06/10/2009  . WEIGHT GAIN 06/10/2009  . ACUTE BRONCHITIS 05/27/2009  . ACTINIC KERATOSIS 03/29/2009  . VITAMIN D DEFICIENCY 02/23/2009  . DEPRESSION 02/23/2009  . ALLERGIC RHINITIS 02/23/2009  . CONSTIPATION 02/23/2009  . IRRITABLE BOWEL SYNDROME 02/23/2009  . BENIGN POSITIONAL VERTIGO, HX OF 02/12/2009  . DYSLIPIDEMIA 12/29/2008  . ESSENTIAL HYPERTENSION, BENIGN 12/29/2008  . GERD 12/29/2008  . TOBACCO USE, QUIT 12/29/2008    Past Surgical History:  Procedure Laterality Date  . APPENDECTOMY    . CHOLECYSTECTOMY      OB History    No data available       Home Medications    Prior to Admission medications   Medication Sig Start Date End Date Taking? Authorizing Provider  Dexlansoprazole (DEXILANT PO) Take 1 capsule by mouth every evening.   Yes Historical Provider, MD  HYDROCHLOROTHIAZIDE PO Take 1 tablet by mouth daily with breakfast.   Yes Historical Provider, MD  LOSARTAN POTASSIUM PO Take 1 tablet by mouth every evening.  Yes Historical Provider, MD  METOPROLOL TARTRATE PO Take 0.5 tablets by mouth every evening.   Yes Historical Provider, MD  SIMVASTATIN PO Take 1 tablet by mouth every evening.   Yes Historical Provider, MD  Triazolam (HALCION PO) Take 1 tablet by mouth at bedtime.   Yes Historical Provider, MD    Family History Family History  Problem Relation Age of Onset  . Microcephaly Father   . Heart attack Sister   . Heart attack Brother     died 22    Social History Social History  Substance  Use Topics  . Smoking status: Never Smoker  . Smokeless tobacco: Never Used  . Alcohol use No     Allergies   Codeine   Review of Systems Review of Systems  Constitutional: Positive for fatigue.  HENT:       +Jaw pain  Respiratory: Positive for cough (non-productive) and shortness of breath.   Cardiovascular: Positive for chest pain and palpitations. Negative for leg swelling.  Gastrointestinal: Negative for abdominal pain, nausea and vomiting.  Genitourinary: Positive for frequency. Negative for dysuria.  Neurological: Positive for headaches.     Physical Exam Updated Vital Signs BP 134/85 (BP Location: Right Arm)   Pulse 72   Temp 97.4 F (36.3 C) (Oral)   Resp 20   Ht 5\' 1"  (1.549 m)   Wt 182 lb 3.2 oz (82.6 kg)   SpO2 99%   BMI 34.43 kg/m   Physical Exam  Constitutional: She is oriented to person, place, and time. She appears well-developed and well-nourished.  HENT:  Head: Normocephalic and atraumatic.  Cardiovascular: Normal rate and regular rhythm.   No murmur heard. Pulmonary/Chest: Effort normal and breath sounds normal. No respiratory distress.  Abdominal: Soft. There is no tenderness. There is no rebound and no guarding.  Musculoskeletal: She exhibits no edema or tenderness.  Neurological: She is alert and oriented to person, place, and time.  Skin: Skin is warm and dry.  Psychiatric: She has a normal mood and affect. Her behavior is normal.  Nursing note and vitals reviewed.    ED Treatments / Results  DIAGNOSTIC STUDIES: Oxygen Saturation is 97% on RA, nl by my interpretation.    COORDINATION OF CARE: 3:57 AM Discussed treatment plan with pt at bedside which includes labs, EKG, CXR, and pt agreed to plan.   Labs (all labs ordered are listed, but only abnormal results are displayed) Labs Reviewed  BASIC METABOLIC PANEL - Abnormal; Notable for the following:       Result Value   Sodium 133 (*)    Potassium 3.1 (*)    Chloride 99 (*)     Glucose, Bld 144 (*)    All other components within normal limits  TROPONIN I - Abnormal; Notable for the following:    Troponin I 0.03 (*)    All other components within normal limits  CBC  TROPONIN I  TROPONIN I  I-STAT TROPOININ, ED    EKG  EKG Interpretation  Date/Time:  Sunday January 09 2016 01:44:26 EDT Ventricular Rate:  85 PR Interval:    QRS Duration: 96 QT Interval:  382 QTC Calculation: 455 R Axis:   28 Text Interpretation:  Sinus rhythm Confirmed by Hazle Coca [phone]) on 01/09/2016 1:58:17 AM       Radiology Dg Chest 2 View  Result Date: 01/09/2016 CLINICAL DATA:  Central chest pain and pressure since 12:30 a.m. Nausea. History of hypertension. Nonsmoker. EXAM: CHEST  2 VIEW COMPARISON:  03/11/2012 FINDINGS: The heart size and mediastinal contours are within normal limits. Both lungs are clear. The visualized skeletal structures are unremarkable. Surgical clips in the right upper quadrant. IMPRESSION: No active cardiopulmonary disease. Electronically Signed   By: Lucienne Capers M.D.   On: 01/09/2016 02:06    Procedures Procedures (including critical care time)  Medications Ordered in ED Medications  acetaminophen (TYLENOL) tablet 650 mg (not administered)  ondansetron (ZOFRAN) injection 4 mg (not administered)  enoxaparin (LOVENOX) injection 40 mg (not administered)  aspirin chewable tablet 324 mg (324 mg Oral Given 01/09/16 0406)     Initial Impression / Assessment and Plan / ED Course  I have reviewed the triage vital signs and the nursing notes.  Pertinent labs & imaging results that were available during my care of the patient were reviewed by me and considered in my medical decision making (see chart for details).  Clinical Course    Patient here for episode of chest pain, resolved on ED evaluation. Heart score 4. Plan to admit for observation.  Final Clinical Impressions(s) / ED Diagnoses   Final diagnoses:  Chest pain, rule out acute  myocardial infarction    New Prescriptions Current Discharge Medication List    I personally performed the services described in this documentation, which was scribed in my presence. The recorded information has been reviewed and is accurate.    Quintella Reichert, MD 01/09/16 [phone]

## 2016-01-09 NOTE — Progress Notes (Signed)
Spoke w/ patient and reviewed data. 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   Episode of CP that woke her has never happened before. ez negative and ECG not acute. OK for [employer].  Lexiscan MV performed, 1 day study, [place] to read.  Lenoard Aden 01/09/2016 12:39 PM Beeper [phone]

## 2016-01-09 NOTE — H&P (Signed)
History and Physical    SONG STEADY [id number] DOB: [birth year] DOA: 01/09/2016   PCP: Criselda Peaches, MD Chief Complaint:  Chief Complaint  Patient presents with  . Chest Pain    HPI: [name] is a 60 y.o. female with medical history significant of HTN, family history of CAD including sudden cardiac death from MI in a brother at age 23, sister had MI at age 91.  Patient presents to the ED with c/o chest pain.  Chest pain is located in left chest, radiation to both jaws, onset earlier this evening and woke her up from sleep, crushing in quality, lasted 45 mins in duration before resolving.  No h/o stress test in past nor cards eval in past.  ED Course: Trop neg, EKG nml  Review of Systems: As per HPI otherwise 10 point review of systems negative.    Past Medical History:  Diagnosis Date  . GERD (gastroesophageal reflux disease)   . High cholesterol   . Hypertension     Past Surgical History:  Procedure Laterality Date  . APPENDECTOMY    . CHOLECYSTECTOMY       reports that she has never smoked. She has never used smokeless tobacco. She reports that she does not drink alcohol. Her drug history is not on file.  Allergies  Allergen Reactions  . Codeine     Family History  Problem Relation Age of Onset  . Microcephaly Father   . Heart attack Sister   . Heart attack Brother     died 71      Prior to Admission medications   Not on File    Physical Exam: Vitals:   01/09/16 0149 01/09/16 0405  BP: 122/90 109/74  Pulse: 84 98  Resp: 16 13  Temp: 97.4 F (36.3 C)   TempSrc: Oral   SpO2: 97% 99%  Weight:  70.3 kg (155 lb)  Height:  5\' 1"  (1.549 m)      Constitutional: NAD, calm, comfortable Eyes: PERRL, lids and conjunctivae normal ENMT: Mucous membranes are moist. Posterior pharynx clear of any exudate or lesions.Normal dentition.  Neck: normal, supple, no masses, no thyromegaly Respiratory: clear to auscultation bilaterally, no  wheezing, no crackles. Normal respiratory effort. No accessory muscle use.  Cardiovascular: Regular rate and rhythm, no murmurs / rubs / gallops. No extremity edema. 2+ pedal pulses. No carotid bruits.  Abdomen: no tenderness, no masses palpated. No hepatosplenomegaly. Bowel sounds positive.  Musculoskeletal: no clubbing / cyanosis. No joint deformity upper and lower extremities. Good ROM, no contractures. Normal muscle tone.  Skin: no rashes, lesions, ulcers. No induration Neurologic: CN 2-12 grossly intact. Sensation intact, DTR normal. Strength 5/5 in all 4.  Psychiatric: Normal judgment and insight. Alert and oriented x 3. Normal mood.    Labs on Admission: I have personally reviewed following labs and imaging studies  CBC:  Recent Labs Lab 01/09/16 0241  WBC 9.0  HGB 14.1  HCT 40.7  MCV 80.4  PLT [id number]   Basic Metabolic Panel:  Recent Labs Lab 01/09/16 0241  NA 133*  K 3.1*  CL 99*  CO2 23  GLUCOSE 144*  BUN 7  CREATININE 0.76  CALCIUM 9.1   GFR: Estimated Creatinine Clearance: 66.2 mL/min (by C-G formula based on SCr of 0.8 mg/dL). Liver Function Tests: No results for input(s): AST, ALT, ALKPHOS, BILITOT, PROT, ALBUMIN in the last 168 hours. No results for input(s): LIPASE, AMYLASE in the last 168 hours. No results for input(s): AMMONIA  in the last 168 hours. Coagulation Profile: No results for input(s): INR, PROTIME in the last 168 hours. Cardiac Enzymes: No results for input(s): CKTOTAL, CKMB, CKMBINDEX, TROPONINI in the last 168 hours. BNP (last 3 results) No results for input(s): PROBNP in the last 8760 hours. HbA1C: No results for input(s): HGBA1C in the last 72 hours. CBG: No results for input(s): GLUCAP in the last 168 hours. Lipid Profile: No results for input(s): CHOL, HDL, LDLCALC, TRIG, CHOLHDL, LDLDIRECT in the last 72 hours. Thyroid Function Tests: No results for input(s): TSH, T4TOTAL, FREET4, T3FREE, THYROIDAB in the last 72 hours. Anemia  Panel: No results for input(s): VITAMINB12, FOLATE, FERRITIN, TIBC, IRON, RETICCTPCT in the last 72 hours. Urine analysis:    Component Value Date/Time   COLORURINE YELLOW 06/03/2008 1030   APPEARANCEUR CLEAR 06/03/2008 1030   LABSPEC 1.010 11/04/2009 1806   PHURINE 7.5 11/04/2009 1806   GLUCOSEU NEGATIVE 11/04/2009 1806   HGBUR SMALL (A) 11/04/2009 1806   HGBUR negative 09/27/2009 0809   BILIRUBINUR NEGATIVE 11/04/2009 1806   KETONESUR NEGATIVE 11/04/2009 1806   PROTEINUR NEGATIVE 11/04/2009 1806   UROBILINOGEN 0.2 11/04/2009 1806   NITRITE NEGATIVE 11/04/2009 1806   LEUKOCYTESUR (A) 11/04/2009 1806    LARGE Biochemical Testing Only. Please order routine urinalysis from main lab if confirmatory testing is needed.   Sepsis Labs: @LABRCNTIP (procalcitonin:4,lacticidven:4) )No results found for this or any previous visit (from the past 240 hour(s)).   Radiological Exams on Admission: Dg Chest 2 View  Result Date: 01/09/2016 CLINICAL DATA:  Central chest pain and pressure since 12:30 a.m. Nausea. History of hypertension. Nonsmoker. EXAM: CHEST  2 VIEW COMPARISON:  03/11/2012 FINDINGS: The heart size and mediastinal contours are within normal limits. Both lungs are clear. The visualized skeletal structures are unremarkable. Surgical clips in the right upper quadrant. IMPRESSION: No active cardiopulmonary disease. Electronically Signed   By: Lucienne Capers M.D.   On: 01/09/2016 02:06    EKG: Independently reviewed.  Assessment/Plan Principal Problem:   Chest pain, rule out acute myocardial infarction    1. CP - 1. Cp obs pathway 2. HEART score of 4 3. Serial trops 4. Tele monitor 5. Stress test ordered   DVT prophylaxis: Lovenox Code Status: Full Family Communication: Husband at bedside Consults called: None Admission status: Admit to obs   GARDNER, [place] [facility] Pager [phone] from 7PM-7AM  If 7AM-7PM, please contact the day physician for the  patient [email or web address] Password [email or web address]  01/09/2016, 4:24 AM

## 2016-01-09 NOTE — Discharge Instructions (Signed)

## 2016-01-09 NOTE — Progress Notes (Signed)
Reviewed discharge information with patient and caregiver. Answered all questions. Patient/caregiver able to teach back medications and reasons to contact MD/911. Patient verbalizes importance of PCP follow up appointment.  Dominiqua Cooner M. Oseph Imburgia, RN  

## 2016-01-25 ENCOUNTER — Encounter: Payer: Self-pay | Admitting: Internal Medicine

## 2016-01-26 ENCOUNTER — Ambulatory Visit (INDEPENDENT_AMBULATORY_CARE_PROVIDER_SITE_OTHER): Payer: BLUE CROSS/BLUE SHIELD | Admitting: Internal Medicine

## 2016-01-26 ENCOUNTER — Encounter: Payer: Self-pay | Admitting: Internal Medicine

## 2016-01-26 VITALS — BP 142/76 | HR 88 | Ht 61.0 in | Wt 189.1 lb

## 2016-01-26 DIAGNOSIS — I10 Essential (primary) hypertension: Secondary | ICD-10-CM

## 2016-01-26 DIAGNOSIS — R079 Chest pain, unspecified: Secondary | ICD-10-CM

## 2016-01-26 DIAGNOSIS — R0602 Shortness of breath: Secondary | ICD-10-CM

## 2016-01-26 DIAGNOSIS — R002 Palpitations: Secondary | ICD-10-CM | POA: Diagnosis not present

## 2016-01-26 DIAGNOSIS — M79606 Pain in leg, unspecified: Secondary | ICD-10-CM

## 2016-01-26 MED ORDER — METOPROLOL TARTRATE 25 MG PO TABS: 12.5000 mg | ORAL_TABLET | Freq: Two times a day (BID) | ORAL | 3 refills | Status: DC

## 2016-01-26 NOTE — Progress Notes (Signed)
New Outpatient Visit Date: 01/26/2016  Primary Care Provider: Levin Erp, MD [address], [place] [address] [place], [place] [address]  Chief Complaint: Recent hospitalization for chest pain, shortness of breath, palpitations.  HPI:  [name] is a 60 y.o. year-old female with history of hypertension, hyperlipidemia, and family history of premature coronary artery disease, who has been referred by Dr. Mart Piggs for evaluation of chest pain, shortness of breath, palpitations. The patient developed palpitations with rapid heart beat and subsequent chest pain and shortness of breath on the evening of 01/09/16 while lying in bed. This persisted for several minutes, prompting her husband to take her to the ER. Shortly after she arrived there, the symptoms abated spontaneously. Overall, she believes that the episode lasted about one hour. She describes the chest pain as tightness in the center of her chest without radiation. Palpitations and shortness of breath were the only accompanying symptoms. She denies lightheadedness, nausea, and diaphoresis. Its maximal intensity was 5/10. The patient reports several similar episodes over the last year, happening about every 2 months. However, the past the episodes have not been as severe.  During her hospitalization, serial troponins were negligible at 0.03. She was transferred from [facility] to [facility], where she underwent regadenoson myocardial perfusion stress test that showed no evidence of ischemia or scar with normal LV systolic function. Her prior workup has included echocardiogram in 2011 demonstrating normal LV contraction (EF [id number]) with diastolic dysfunction.  Patient notes that her brother had a myocardial infarction at age 50. Her father also died with heart disease in his early 16s. Her sister has been diagnosed with atrial fibrillation.  The patient also endorses bilateral leg pain in her knees and calves when walking or going down stairs. This  has been present for at least a few months. She previously underwent ABIs and Doppler examination of the lower extremities 08/2014, which were normal.  --------------------------------------------------------------------------------------------------  Past Medical History:  Diagnosis Date  . Chest discomfort   . GERD (gastroesophageal reflux disease)   . High cholesterol   . Hypertension   . Joint pain   . 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 Osteoporosis    T SCORE OF -2.7 ON DEXA IN 2013    Past Surgical History:  Procedure Laterality Date  . APPENDECTOMY    . CHOLECYSTECTOMY      Outpatient Encounter Prescriptions as of 01/26/2016  Medication Sig  . dexlansoprazole (DEXILANT) 60 MG capsule Take 60 mg by mouth daily.  . hydrochlorothiazide (HYDRODIURIL) 25 MG tablet Take 25 mg by mouth daily.  Marland Kitchen losartan (COZAAR) 50 MG tablet Take 50 mg by mouth daily.  . simvastatin (ZOCOR) 40 MG tablet Take 40 mg by mouth daily.  . triazolam (HALCION) 0.25 MG tablet Take 0.25 mg by mouth at bedtime.  . [DISCONTINUED] metoprolol tartrate (LOPRESSOR) 25 MG tablet Take 12.5 mg by mouth daily.  . metoprolol tartrate (LOPRESSOR) 25 MG tablet Take 0.5 tablets (12.5 mg total) by mouth 2 (two) times daily.   No facility-administered encounter medications on file as of 01/26/2016.     Allergies: Codeine  Social History   Social History  . Marital status: Married    Spouse name: N/A  . Number of children: N/A  . Years of education: N/A   Occupational History  . UNEMPLOYED    Social History Main Topics  . Smoking status: Former Smoker    Packs/day: 2.00    Years: 20.00    Types:  Cigarettes    Quit date: 12/24/2008  . Smokeless tobacco: Never Used  . Alcohol use No  . Drug use: No  . Sexual activity: Not on file   Other Topics Concern  . Not on file   Social History Narrative  . No narrative on file    Family History  Problem Relation Age of Onset  . Microcephaly Father   .  CAD Father   . Heart attack Sister   . Heart attack Brother     died 80  . Colon polyps Mother   . Hypertension Mother   . Colon cancer Neg Hx   . Liver disease Neg Hx     Review of Systems: Review of Systems  Constitutional: Negative.   HENT: Negative.   Eyes: Negative.   Respiratory: Positive for shortness of breath.   Cardiovascular: Positive for chest pain, palpitations and leg swelling.  Gastrointestinal: Negative.   Genitourinary: Negative.   Musculoskeletal: Negative.   Skin: Positive for rash (sores overlying both shins).  Neurological: Negative.  Negative for dizziness.  Endo/Heme/Allergies: Negative.   Psychiatric/Behavioral: Negative.     --------------------------------------------------------------------------------------------------  Physical Exam: BP (!) 142/76   Pulse 88   Ht 5\' 1"  (1.549 m)   Wt 189 lb 1.9 oz (85.8 kg)   SpO2 96%   BMI 35.73 kg/m   General:  Obese woman, seated comfortably in the exam room. HEENT: No conjunctival pallor or scleral icterus.  Moist mucous membranes.  OP clear. Neck: Supple without lymphadenopathy, thyromegaly, JVD, or HJR.  No carotid bruit. Lungs: Normal work of breathing.  Clear to auscultation bilaterally without wheezes or crackles. CV: Regular rate and rhythm without murmurs, rubs, or gallops.  Non-displaced PMI. Abd: Bowel sounds present.  Soft, NT/ND without hepatosplenomegaly Ext: No lower extremity edema.  Radial, PT, and DP pulses are 2+ bilaterally Skin: warm and dry with erythematous plaques and areas of excoriation overlying both shins Neuro: CNIII-XII intact.  Strength and fine-touch sensation intact in upper and lower extremities bilaterally. Psych: Normal mood and affect.  EKG (01/09/16): Normal sinus rhythm without significant abnormalities.  Lab Results  Component Value Date   WBC 9.0 01/09/2016   HGB 14.1 01/09/2016   HCT 40.7 01/09/2016   MCV 80.4 01/09/2016   PLT 307 01/09/2016    Lab  Results  Component Value Date   NA 133 (L) 01/09/2016   K 3.1 (L) 01/09/2016   CL 99 (L) 01/09/2016   CO2 23 01/09/2016   BUN 7 01/09/2016   CREATININE 0.76 01/09/2016   GLUCOSE 144 (H) 01/09/2016   ALT 23 11/01/2009    Lab Results  Component Value Date   CHOL 201 (H) 12/23/2009   HDL 44 12/23/2009   LDLCALC 97 12/23/2009   TRIG 299 (H) 12/23/2009   CHOLHDL 4.6 Ratio 12/23/2009     --------------------------------------------------------------------------------------------------  ASSESSMENT AND PLAN: 1. Palpitations, chest pain, and shortness of breath Patient was recently hospitalized with episode of palpitations accompanying chest pain and shortness of breath. Her constellation of symptoms are most suggestive of a tachycardia arrhythmia as the precipitating cause. No arrhythmias were reportedly noted during her hospitalization. Myocardial perfusion stress test also showed no evidence of ischemia or scar with a normal LVEF. We have agreed to proceed with a 30 day event monitor and increase metoprolol to 12.5 mg twice a day. The patient should contact us if her symptoms worsen. Otherwise we will reassess when she returns for follow-up  2. Pain of lower extremity, unspecified laterality  Patient has bilateral lower extremity pain with ambulation. She has palpable pedal pulses and normal ABIs and lower extremity Doppler study 2016, making claudication unlikely. She has notable leg swelling and skin changes consistent with venous insufficiency that may be contributing to her discomfort. I encouraged her to begin wearing compression stockings during the day and to elevate her legs when possible and night.  5. Essential hypertension, benign Blood pressure is mildly elevated today. As above, we will increase metoprolol which may have some benefit on her blood pressure.  Follow-up: Return to clinic in 3 months.  Nelva Bush, MD 01/26/2016 4:08 PM

## 2016-01-26 NOTE — Patient Instructions (Signed)
Your physician has recommended you make the following change in your medication:  1.) increase metoprolol to 12.5 mg two times a day  Your physician has recommended that you wear an event monitor. Event monitors are medical devices that record the heart's electrical activity. Doctors most often [place] these monitors to diagnose arrhythmias. Arrhythmias are problems with the speed or rhythm of the heartbeat. The monitor is a small, portable device. You can wear one while you do your normal daily activities. This is usually used to diagnose what is causing palpitations/syncope (passing out).  Your physician recommends that you schedule a follow-up appointment in: 3 months with Dr. Saunders Revel.

## 2016-02-04 ENCOUNTER — Ambulatory Visit (INDEPENDENT_AMBULATORY_CARE_PROVIDER_SITE_OTHER): Payer: BLUE CROSS/BLUE SHIELD

## 2016-02-04 DIAGNOSIS — R002 Palpitations: Secondary | ICD-10-CM | POA: Diagnosis not present

## 2016-02-04 DIAGNOSIS — R079 Chest pain, unspecified: Secondary | ICD-10-CM | POA: Diagnosis not present

## 2016-02-09 DIAGNOSIS — M810 Age-related osteoporosis without current pathological fracture: Secondary | ICD-10-CM | POA: Diagnosis not present

## 2016-02-15 DIAGNOSIS — I10 Essential (primary) hypertension: Secondary | ICD-10-CM | POA: Diagnosis not present

## 2016-02-15 DIAGNOSIS — L309 Dermatitis, unspecified: Secondary | ICD-10-CM | POA: Diagnosis not present

## 2016-02-15 DIAGNOSIS — R748 Abnormal levels of other serum enzymes: Secondary | ICD-10-CM | POA: Diagnosis not present

## 2016-02-15 DIAGNOSIS — M810 Age-related osteoporosis without current pathological fracture: Secondary | ICD-10-CM | POA: Diagnosis not present

## 2016-02-15 DIAGNOSIS — E559 Vitamin D deficiency, unspecified: Secondary | ICD-10-CM | POA: Diagnosis not present

## 2016-02-20 DIAGNOSIS — J069 Acute upper respiratory infection, unspecified: Secondary | ICD-10-CM | POA: Diagnosis not present

## 2016-03-09 ENCOUNTER — Telehealth: Payer: Self-pay | Admitting: Internal Medicine

## 2016-03-09 NOTE — Telephone Encounter (Signed)
Returning your call from today. °

## 2016-03-09 NOTE — Telephone Encounter (Signed)
Spoke with patient about recent monitor results

## 2016-04-20 ENCOUNTER — Encounter (INDEPENDENT_AMBULATORY_CARE_PROVIDER_SITE_OTHER): Payer: Self-pay

## 2016-04-20 ENCOUNTER — Ambulatory Visit (INDEPENDENT_AMBULATORY_CARE_PROVIDER_SITE_OTHER): Payer: BLUE CROSS/BLUE SHIELD | Admitting: Internal Medicine

## 2016-04-20 ENCOUNTER — Encounter: Payer: Self-pay | Admitting: Internal Medicine

## 2016-04-20 VITALS — BP 132/74 | HR 68 | Ht 61.0 in | Wt 186.0 lb

## 2016-04-20 DIAGNOSIS — Z23 Encounter for immunization: Secondary | ICD-10-CM | POA: Diagnosis not present

## 2016-04-20 DIAGNOSIS — E876 Hypokalemia: Secondary | ICD-10-CM

## 2016-04-20 DIAGNOSIS — R002 Palpitations: Secondary | ICD-10-CM | POA: Diagnosis not present

## 2016-04-20 DIAGNOSIS — R609 Edema, unspecified: Secondary | ICD-10-CM

## 2016-04-20 DIAGNOSIS — M79604 Pain in right leg: Secondary | ICD-10-CM | POA: Diagnosis not present

## 2016-04-20 DIAGNOSIS — R079 Chest pain, unspecified: Secondary | ICD-10-CM

## 2016-04-20 DIAGNOSIS — M79605 Pain in left leg: Secondary | ICD-10-CM

## 2016-04-20 MED ORDER — METOPROLOL TARTRATE 25 MG PO TABS
12.5000 mg | ORAL_TABLET | Freq: Two times a day (BID) | ORAL | Status: DC
Start: 2016-04-20 — End: 2023-03-12

## 2016-04-20 NOTE — Patient Instructions (Addendum)
Your physician recommends that you continue on your current medications as directed. Please refer to the Current Medication list given to you today. Your physician has requested that you have an echocardiogram. Echocardiography is a painless test that uses sound waves to create images of your heart. It provides your doctor with information about the size and shape of your heart and how well your heart's chambers and valves are working. This procedure takes approximately one hour. There are no restrictions for this procedure.   Your physician recommends that you return for lab work in:    TODAY BMET [place] [facility]  AND HGBA1C  Your physician recommends that you schedule a follow-up appointment in: [place]

## 2016-04-20 NOTE — Progress Notes (Signed)
Follow-up Outpatient Visit Date: 04/20/2016  Chief Complaint: Leg pain  HPI:  [name] is a 60 y.o. year-old female with history of hypertension, hyperlipidemia, and family history of premature coronary artery disease, who presents for follow-up of chest pain, shortness of breath, palpitations. I first met her on 01/26/16 after a recent ED visit for chest pain. Myocardial perfusion stress test had been normal. She complained of intermittent palpitations as the predominant symptom, prompting [place] to increase metoprolol to 12.5 mg twice a day. However, she has continued to take this once daily. We also obtained a 30-day event monitor (see details below), which demonstrated occasional PACs and PVCs. [name] notes that her chest pain and dyspnea have not been present over the last 2 months. She continues to have occasional brief palpitations lasting a few seconds. She denies accompanying chest pain and lightheadedness.  The patient's chief complaint today is of bilateral leg pain with accompanying swelling. This has been present for years. It seems to be worse when standing for extended period of time. She also has some burning in her legs when lying in bed at night. She denies pain in her legs with ambulation. Prior arterial Dopplers in 2016 showed no evidence of significant PVD. She has mild chronic lower extremity edema, worsened when standing for prolonged periods. She denies orthopnea, PND, and shortness of breath.she does not wear compression stockings on a regular basis.  --------------------------------------------------------------------------------------------------  Cardiovascular History & Procedures: Cardiovascular Problems:  Atypical chest pain  Palpitations  Leg pain and edema  Risk Factors:  Hypertension, hyperlipidemia, and family history of premature cardiovascular disease  Cath/PCI:  None  CV Surgery:  None  EP Procedures and Devices:  30 day event monitor  (03/09/16): Predominant rhythm was sinus with heart rate from 50-100 bpm. No sustained arrhythmias or prolonged pauses were identified. Occasional isolated PACs and PVCs were noted.  Non-Invasive Evaluation(s):  Pharmacologic myocardial perfusion stress test (01/09/16): Low risk study without evidence of ischemia or infarction. LVEF 69%.  Recent CV Pertinent Labs: Lab Results  Component Value Date   CHOL 201 (H) 12/23/2009   HDL 44 12/23/2009   LDLCALC 97 12/23/2009   TRIG 299 (H) 12/23/2009   CHOLHDL 4.6 Ratio 12/23/2009   K 3.1 (L) 01/09/2016   MG 2.0 12/17/2008   BUN 7 01/09/2016   CREATININE 0.76 01/09/2016    Past medical and surgical history were reviewed and updated in EPIC.   Outpatient Encounter Prescriptions as of 04/20/2016  Medication Sig  . dexlansoprazole (DEXILANT) 60 MG capsule Take 60 mg by mouth daily.  . hydrochlorothiazide (HYDRODIURIL) 25 MG tablet Take 25 mg by mouth daily.  Marland Kitchen losartan (COZAAR) 100 MG tablet Take 100 mg by mouth daily.  . metoprolol tartrate (LOPRESSOR) 25 MG tablet Take 12.5 mg by mouth daily.   . simvastatin (ZOCOR) 40 MG tablet Take 40 mg by mouth daily.  . triazolam (HALCION) 0.25 MG tablet Take 0.25 mg by mouth at bedtime.  . [DISCONTINUED] losartan (COZAAR) 50 MG tablet Take 50 mg by mouth daily.  . [DISCONTINUED] metoprolol tartrate (LOPRESSOR) 25 MG tablet Take 0.5 tablets (12.5 mg total) by mouth 2 (two) times daily. (Patient not taking: Reported on 04/20/2016)   No facility-administered encounter medications on file as of 04/20/2016.     Allergies: Codeine  Social History   Social History  . Marital status: Married    Spouse name: N/A  . Number of children: N/A  . Years of education: N/A   Occupational  History  . UNEMPLOYED    Social History Main Topics  . Smoking status: Former Smoker    Packs/day: 2.00    Years: 20.00    Types: Cigarettes    Quit date: 12/24/2008  . Smokeless tobacco: Never Used  . Alcohol use No    . Drug use: No  . Sexual activity: Not on file   Other Topics Concern  . Not on file   Social History Narrative  . No narrative on file    Family History  Problem Relation Age of Onset  . Microcephaly Father   . CAD Father   . Heart disease Sister     atrial fibrillation  . Heart attack Brother     died 1  . Colon polyps Mother   . Hypertension Mother   . Colon cancer Neg Hx   . Liver disease Neg Hx     Review of Systems: A 12-system review of systems was performed and was negative except as noted in the HPI.  --------------------------------------------------------------------------------------------------  Physical Exam: BP 132/74   Pulse 68   Ht _0  (1.549 m)   Wt 186 lb (84.4 kg)   BMI 35.14 kg/m   General:  Obese woman, seated comfortably in the exam room. HEENT: No conjunctival pallor or scleral icterus.  Moist mucous membranes.  OP clear. Neck: Supple without lymphadenopathy, thyromegaly, JVD, or HJR.  No carotid bruit. Lungs: Normal work of breathing.  Clear to auscultation bilaterally without wheezes or crackles. Heart: Regular rate and rhythm without murmurs, rubs, or gallops.  Non-displaced PMI. Abd: Bowel sounds present.  Soft, NT/ND without hepatosplenomegaly Ext: 1+ pretibial edema bilaterally.  Radial, PT, and DP pulses are 2+ bilaterally. Skin: warm with erythematous shallow ulcers and excoriations overlying both shins.   Lab Results  Component Value Date   WBC 9.0 01/09/2016   HGB 14.1 01/09/2016   HCT 40.7 01/09/2016   MCV 80.4 01/09/2016   PLT 307 01/09/2016    Lab Results  Component Value Date   NA 133 (L) 01/09/2016   K 3.1 (L) 01/09/2016   CL 99 (L) 01/09/2016   CO2 23 01/09/2016   BUN 7 01/09/2016   CREATININE 0.76 01/09/2016   GLUCOSE 144 (H) 01/09/2016   ALT 23 11/01/2009    Lab Results  Component Value Date   CHOL 201 (H) 12/23/2009   HDL 44 12/23/2009   LDLCALC 97 12/23/2009   TRIG 299 (H) 12/23/2009   CHOLHDL  4.6 Ratio 12/23/2009   --------------------------------------------------------------------------------------------------  ASSESSMENT AND PLAN: Leg pain and edema Exam today is demonstrates lower extremity edema and skin changes most suggestive of chronic venous insufficiency. She has reasonable pedal pulses as well as normal ABIs last year. We have agreed to obtain a transthoracic echocardiogram to exclude cardiac etiology for her edema. I've encouraged the patient to wear compression stockings when standing for prolonged periods. We will also check a basic metabolic panel, magnesium, TSH, and hemoglobin A1c to evaluate for other etiologies, including thyroid disease and undiagnosed diabetes, that could be contributing to her symptoms.  Palpitations Patient continues to have brief palpitations likely corresponding to the PACs and PVCs noted on her recent event monitor. I asked her to increase metoprolol to 12.5 mg twice a day. As above, we will perform a transthoracic echocardiogram to evaluate for structural abnormalities that may be precipitating these events.  The patient also has a history of hypokalemia; we will check BMP and Mg today.  Chest pain No further episodes since  her last visit. We will increase metoprolol, as above, and continue simvastatin for primary prevention.  Influenza vaccination Seasonal flu vaccine administered today.  Follow-up: Return to clinic in 3 months.  Nelva Bush, MD 04/20/2016 4:18 PM

## 2016-04-21 LAB — HEMOGLOBIN A1C
Hgb A1c MFr Bld: 5.5 % (ref <5.7)
Mean Plasma Glucose: 111 mg/dL

## 2016-04-21 LAB — BASIC METABOLIC PANEL
BUN: 8 mg/dL (ref 7–25)
CHLORIDE: 105 mmol/L (ref 98–110)
CO2: 25 mmol/L (ref 20–31)
CREATININE: 0.79 mg/dL (ref 0.50–0.99)
Calcium: 9.3 mg/dL (ref 8.6–10.4)
GLUCOSE: 115 mg/dL — AB (ref 65–99)
Potassium: 3.7 mmol/L (ref 3.5–5.3)
Sodium: 141 mmol/L (ref 135–146)

## 2016-04-21 LAB — TSH: TSH: 1.63 mIU/L

## 2016-04-21 LAB — MAGNESIUM: Magnesium: 2 mg/dL (ref 1.5–2.5)

## 2016-05-24 ENCOUNTER — Ambulatory Visit (HOSPITAL_COMMUNITY): Payer: BLUE CROSS/BLUE SHIELD

## 2016-07-26 ENCOUNTER — Encounter: Payer: Self-pay | Admitting: Internal Medicine

## 2016-08-04 ENCOUNTER — Ambulatory Visit: Payer: BLUE CROSS/BLUE SHIELD | Admitting: Internal Medicine

## 2016-09-01 ENCOUNTER — Ambulatory Visit: Payer: BLUE CROSS/BLUE SHIELD | Admitting: Internal Medicine

## 2016-09-13 ENCOUNTER — Other Ambulatory Visit (HOSPITAL_COMMUNITY): Payer: BLUE CROSS/BLUE SHIELD

## 2016-09-19 ENCOUNTER — Encounter: Payer: Self-pay | Admitting: Internal Medicine

## 2016-09-20 ENCOUNTER — Other Ambulatory Visit (HOSPITAL_COMMUNITY): Payer: BLUE CROSS/BLUE SHIELD

## 2016-09-28 ENCOUNTER — Ambulatory Visit (HOSPITAL_COMMUNITY): Payer: BLUE CROSS/BLUE SHIELD | Attending: Cardiovascular Disease

## 2016-09-28 ENCOUNTER — Other Ambulatory Visit: Payer: Self-pay

## 2016-09-28 DIAGNOSIS — R609 Edema, unspecified: Secondary | ICD-10-CM | POA: Diagnosis not present

## 2016-10-05 ENCOUNTER — Ambulatory Visit: Payer: BLUE CROSS/BLUE SHIELD | Admitting: Internal Medicine

## 2016-10-10 ENCOUNTER — Telehealth: Payer: Self-pay | Admitting: Internal Medicine

## 2016-10-10 ENCOUNTER — Encounter: Payer: Self-pay | Admitting: *Deleted

## 2016-10-10 NOTE — Telephone Encounter (Signed)
[name] is calling to get test results .  Please call

## 2016-10-10 NOTE — Telephone Encounter (Signed)
LMTCB

## 2016-10-11 ENCOUNTER — Encounter: Payer: Self-pay | Admitting: Internal Medicine

## 2016-10-11 NOTE — Telephone Encounter (Signed)
LMTCB

## 2016-10-12 NOTE — Telephone Encounter (Signed)
Follow up    Returning a call to the nurse to get echo results 

## 2016-10-12 NOTE — Telephone Encounter (Signed)
Patient calling to find out her results of her echocardiogram. Patient made aware of results. Patient verbalized understanding. Patient also notified that she missed her follow up appt with Dr. Saunders Revel on 10/05/16. Patient states that she did not know that she had an appt. that day. Patient offered appointment on 6/7 at 11:40AM with Dr. Saunders Revel. Patient states that she cannot come to morning appointments and did not want to see APP. Patient made aware that I would send this message to Dr. Darnelle Bos nurse for her to try and schedule the appt. Patient verbalized understanding and was in agreement with this plan.  Notes recorded by Nelva Bush, MD on 09/29/2016 at 8:14 AM EDT Please let Ms. Migliaccio note that her echocardiogram does not show any significant abnormalities. There is mild thickening of the left ventricle and mild enlargement of the left atrium, which likely do not account for her leg swelling. She should continue her current medications and follow-up with [place] later this month, as previously arranged.

## 2016-10-16 NOTE — Telephone Encounter (Signed)
LMTCB

## 2016-10-24 NOTE — Telephone Encounter (Signed)
LMTCB

## 2016-10-26 NOTE — Telephone Encounter (Signed)
I received voice mail message when I attempted to contact patient.  I have been unable to contact pt either by telephone or by mail to reschedule missed appt with Dr End.

## 2016-10-30 DIAGNOSIS — J309 Allergic rhinitis, unspecified: Secondary | ICD-10-CM | POA: Diagnosis not present

## 2016-10-30 DIAGNOSIS — J0100 Acute maxillary sinusitis, unspecified: Secondary | ICD-10-CM | POA: Diagnosis not present

## 2016-10-30 DIAGNOSIS — R062 Wheezing: Secondary | ICD-10-CM | POA: Diagnosis not present

## 2016-11-09 DIAGNOSIS — E785 Hyperlipidemia, unspecified: Secondary | ICD-10-CM | POA: Diagnosis not present

## 2016-11-09 DIAGNOSIS — M810 Age-related osteoporosis without current pathological fracture: Secondary | ICD-10-CM | POA: Diagnosis not present

## 2016-11-09 DIAGNOSIS — I10 Essential (primary) hypertension: Secondary | ICD-10-CM | POA: Diagnosis not present

## 2016-11-09 DIAGNOSIS — E559 Vitamin D deficiency, unspecified: Secondary | ICD-10-CM | POA: Diagnosis not present

## 2016-11-09 DIAGNOSIS — L309 Dermatitis, unspecified: Secondary | ICD-10-CM | POA: Diagnosis not present

## 2016-11-10 DIAGNOSIS — E785 Hyperlipidemia, unspecified: Secondary | ICD-10-CM | POA: Diagnosis not present

## 2016-11-10 DIAGNOSIS — E559 Vitamin D deficiency, unspecified: Secondary | ICD-10-CM | POA: Diagnosis not present

## 2016-11-10 DIAGNOSIS — I10 Essential (primary) hypertension: Secondary | ICD-10-CM | POA: Diagnosis not present

## 2016-11-10 DIAGNOSIS — R799 Abnormal finding of blood chemistry, unspecified: Secondary | ICD-10-CM | POA: Diagnosis not present

## 2016-11-13 DIAGNOSIS — H53002 Unspecified amblyopia, left eye: Secondary | ICD-10-CM | POA: Diagnosis not present

## 2016-11-13 DIAGNOSIS — H2512 Age-related nuclear cataract, left eye: Secondary | ICD-10-CM | POA: Diagnosis not present

## 2016-11-13 DIAGNOSIS — E7030 Albinism, unspecified: Secondary | ICD-10-CM | POA: Diagnosis not present

## 2016-11-13 DIAGNOSIS — H25811 Combined forms of age-related cataract, right eye: Secondary | ICD-10-CM | POA: Diagnosis not present

## 2016-12-04 ENCOUNTER — Telehealth: Payer: Self-pay | Admitting: Internal Medicine

## 2016-12-04 NOTE — Telephone Encounter (Signed)
LMTCB

## 2016-12-04 NOTE — Telephone Encounter (Signed)
New message ° ° ° °Patient calling for echo test results °

## 2016-12-04 NOTE — Telephone Encounter (Signed)
Unable to leave message,mailbox full. 

## 2016-12-06 DIAGNOSIS — Z1211 Encounter for screening for malignant neoplasm of colon: Secondary | ICD-10-CM | POA: Diagnosis not present

## 2016-12-06 DIAGNOSIS — L309 Dermatitis, unspecified: Secondary | ICD-10-CM | POA: Diagnosis not present

## 2016-12-06 DIAGNOSIS — K219 Gastro-esophageal reflux disease without esophagitis: Secondary | ICD-10-CM | POA: Diagnosis not present

## 2016-12-06 DIAGNOSIS — Z23 Encounter for immunization: Secondary | ICD-10-CM | POA: Diagnosis not present

## 2016-12-06 DIAGNOSIS — Z0000 Encounter for general adult medical examination without abnormal findings: Secondary | ICD-10-CM | POA: Diagnosis not present

## 2016-12-06 DIAGNOSIS — R739 Hyperglycemia, unspecified: Secondary | ICD-10-CM | POA: Diagnosis not present

## 2016-12-07 ENCOUNTER — Other Ambulatory Visit: Payer: Self-pay | Admitting: Acute Care

## 2016-12-07 DIAGNOSIS — Z87891 Personal history of nicotine dependence: Secondary | ICD-10-CM

## 2016-12-12 NOTE — Telephone Encounter (Signed)
Unable to reach pt by telephone

## 2016-12-13 ENCOUNTER — Ambulatory Visit (INDEPENDENT_AMBULATORY_CARE_PROVIDER_SITE_OTHER): Payer: BLUE CROSS/BLUE SHIELD | Admitting: Acute Care

## 2016-12-13 ENCOUNTER — Encounter: Payer: Self-pay | Admitting: Acute Care

## 2016-12-13 ENCOUNTER — Ambulatory Visit
Admission: RE | Admit: 2016-12-13 | Discharge: 2016-12-13 | Disposition: A | Discharge status: Home / Self Care | Payer: BLUE CROSS/BLUE SHIELD | Source: Ambulatory Visit | Attending: Acute Care | Admitting: Acute Care

## 2016-12-13 DIAGNOSIS — Z87891 Personal history of nicotine dependence: Secondary | ICD-10-CM

## 2016-12-13 NOTE — Progress Notes (Signed)
Shared Decision Making Visit Lung Cancer Screening Program [phone])   Eligibility:  Age 61 y.o.  Pack Years Smoking History Calculation 87.5 pack year smoking history (# packs/per year x # years smoked)  Recent History of coughing up blood  no  Unexplained weight loss? no ( >Than 15 pounds within the last 6 months )  Prior History Lung / other cancer no (Diagnosis within the last 5 years already requiring surveillance chest CT Scans).  Smoking Status Former Smoker  Former Smokers: Years since quit: 5 years  Quit Date: 2013  Visit Components:  Discussion included one or more decision making aids. yes  Discussion included risk/benefits of screening. yes  Discussion included potential follow up diagnostic testing for abnormal scans. yes  Discussion included meaning and risk of over diagnosis. yes  Discussion included meaning and risk of False Positives. yes  Discussion included meaning of total radiation exposure. yes  Counseling Included:  Importance of adherence to annual lung cancer LDCT screening. yes  Impact of comorbidities on ability to participate in the program. yes  Ability and willingness to under diagnostic treatment. yes  Smoking Cessation Counseling:  Current Smokers:   Discussed importance of smoking cessation.NA former smoker  Information about tobacco cessation classes and interventions provided to patient. yes  Patient provided with "ticket" for LDCT Scan. yes  Symptomatic Patient. no  Counseling  Diagnosis Code: Tobacco Use Z72.0  Asymptomatic Patient yes  Counseling (Intermediate counseling: > three minutes counseling) [id number]  Former Smokers:   Discussed the importance of maintaining cigarette abstinence. yes  Diagnosis Code: Personal History of Nicotine Dependence. I96.789  Information about tobacco cessation classes and interventions provided to patient. Yes  Patient provided with "ticket" for LDCT Scan. yes  Written Order for  Lung Cancer Screening with LDCT placed in Epic. Yes (CT Chest Lung Cancer Screening Low Dose W/O CM) [id number] Z12.2-Screening of respiratory organs Z87.891-Personal history of nicotine dependence  I spent 25 minutes of face to face time with Masoner discussing the risks and benefits of lung cancer screening. We viewed a power point together that explained in detail the above noted topics. We took the time to pause the power point at intervals to allow for questions to be asked and answered to ensure understanding. We discussed that she had taken the single most powerful action possible to decrease her risk of developing lung cancer when she quit smoking. I counseled her to remain smoke free, and to contact me if she ever had the desire to smoke again so that I can provide resources and tools to help support the effort to remain smoke free. We discussed the time and location of the scan, and that either  Doroteo Glassman RN or I will call with the results within  24-48 hours of receiving them. Ms. Riches has my card and contact information in the event she needs to speak with me, in addition to a copy of the power point we reviewed as a resource. Ms. Groseclose verbalized understanding of all of the above and had no further questions upon leaving the office.     I explained to the patient that there has been a high incidence of coronary artery disease noted on these exams. I explained that this is a non-gated exam therefore degree or severity cannot be determined. This patient is currently on statin therapy. I have asked the patient to follow-up with their PCP regarding any incidental finding of coronary artery disease and management with diet or medication as they feel  is clinically indicated. The patient verbalized understanding of the above and had no further questions.     Magdalen Spatz, NP 12/13/2016

## 2016-12-19 ENCOUNTER — Telehealth: Payer: Self-pay | Admitting: Acute Care

## 2016-12-19 DIAGNOSIS — Z87891 Personal history of nicotine dependence: Secondary | ICD-10-CM

## 2016-12-19 DIAGNOSIS — R911 Solitary pulmonary nodule: Secondary | ICD-10-CM

## 2016-12-19 DIAGNOSIS — I10 Essential (primary) hypertension: Secondary | ICD-10-CM | POA: Diagnosis not present

## 2016-12-19 DIAGNOSIS — M545 Low back pain: Secondary | ICD-10-CM | POA: Diagnosis not present

## 2016-12-19 NOTE — Telephone Encounter (Signed)
Result Notes   Notes recorded by Lorane Gell, CMA on 12/19/2016 at 8:57 AM EDT LMTCB ------  Notes recorded by Magdalen Spatz, NP on 12/15/2016 at 3:30 PM EDT Please call patient and let her know her low-dose screening CT was read as a Lung RADS 2: nodules that are benign in appearance and behavior with a very low likelihood of becoming a clinically active cancer due to size or lack of growth. Recommendation per radiology is for a repeat LDCT in 12 months. Please let her know we will order and schedule her annual screening for August 2019. Please let her know there was notation of coronary artery disease on the scan. She is currently being treated with statin therapy per her PCP. Please remind the patient that this is a non-gated exam therefore degree or severity of disease cannot be determined. Please have her follow up with her PCP regarding potential risk factor modification, dietary therapy or pharmacologic therapy if clinically indicated. Please place order for annual screening in August 2019, and fax results to PCP. Thank you     Pt is aware of results and order placed for 12-2017 for LDCT scan.

## 2016-12-19 NOTE — Telephone Encounter (Signed)
Patient is returning Katie's phone call.

## 2016-12-25 ENCOUNTER — Other Ambulatory Visit: Payer: Self-pay | Admitting: Acute Care

## 2016-12-25 DIAGNOSIS — Z122 Encounter for screening for malignant neoplasm of respiratory organs: Secondary | ICD-10-CM

## 2016-12-25 DIAGNOSIS — Z87891 Personal history of nicotine dependence: Secondary | ICD-10-CM

## 2017-01-09 ENCOUNTER — Observation Stay (HOSPITAL_COMMUNITY)
Admission: EM | Admit: 2017-01-09 | Discharge: 2017-01-10 | Disposition: A | Discharge status: Home / Self Care | Payer: BLUE CROSS/BLUE SHIELD | Attending: Internal Medicine | Admitting: Internal Medicine

## 2017-01-09 ENCOUNTER — Observation Stay (HOSPITAL_COMMUNITY): Payer: BLUE CROSS/BLUE SHIELD

## 2017-01-09 ENCOUNTER — Emergency Department (HOSPITAL_COMMUNITY): Payer: BLUE CROSS/BLUE SHIELD

## 2017-01-09 ENCOUNTER — Encounter (HOSPITAL_COMMUNITY): Payer: Self-pay | Admitting: Emergency Medicine

## 2017-01-09 DIAGNOSIS — R531 Weakness: Principal | ICD-10-CM

## 2017-01-09 DIAGNOSIS — I6523 Occlusion and stenosis of bilateral carotid arteries: Secondary | ICD-10-CM | POA: Insufficient documentation

## 2017-01-09 DIAGNOSIS — I10 Essential (primary) hypertension: Secondary | ICD-10-CM | POA: Insufficient documentation

## 2017-01-09 DIAGNOSIS — G459 Transient cerebral ischemic attack, unspecified: Secondary | ICD-10-CM | POA: Diagnosis not present

## 2017-01-09 DIAGNOSIS — E7800 Pure hypercholesterolemia, unspecified: Secondary | ICD-10-CM | POA: Diagnosis not present

## 2017-01-09 DIAGNOSIS — G4700 Insomnia, unspecified: Secondary | ICD-10-CM | POA: Diagnosis not present

## 2017-01-09 DIAGNOSIS — Z7951 Long term (current) use of inhaled steroids: Secondary | ICD-10-CM | POA: Diagnosis not present

## 2017-01-09 DIAGNOSIS — Z87891 Personal history of nicotine dependence: Secondary | ICD-10-CM | POA: Diagnosis not present

## 2017-01-09 DIAGNOSIS — R200 Anesthesia of skin: Secondary | ICD-10-CM

## 2017-01-09 DIAGNOSIS — Z8249 Family history of ischemic heart disease and other diseases of the circulatory system: Secondary | ICD-10-CM | POA: Diagnosis not present

## 2017-01-09 DIAGNOSIS — M810 Age-related osteoporosis without current pathological fracture: Secondary | ICD-10-CM | POA: Diagnosis not present

## 2017-01-09 DIAGNOSIS — K219 Gastro-esophageal reflux disease without esophagitis: Secondary | ICD-10-CM

## 2017-01-09 DIAGNOSIS — D72829 Elevated white blood cell count, unspecified: Secondary | ICD-10-CM | POA: Diagnosis not present

## 2017-01-09 DIAGNOSIS — E785 Hyperlipidemia, unspecified: Secondary | ICD-10-CM

## 2017-01-09 DIAGNOSIS — M6281 Muscle weakness (generalized): Secondary | ICD-10-CM | POA: Diagnosis not present

## 2017-01-09 DIAGNOSIS — I4891 Unspecified atrial fibrillation: Secondary | ICD-10-CM | POA: Insufficient documentation

## 2017-01-09 DIAGNOSIS — R29898 Other symptoms and signs involving the musculoskeletal system: Secondary | ICD-10-CM

## 2017-01-09 DIAGNOSIS — Z6835 Body mass index (BMI) 35.0-35.9, adult: Secondary | ICD-10-CM | POA: Insufficient documentation

## 2017-01-09 DIAGNOSIS — E669 Obesity, unspecified: Secondary | ICD-10-CM | POA: Insufficient documentation

## 2017-01-09 DIAGNOSIS — R29818 Other symptoms and signs involving the nervous system: Secondary | ICD-10-CM | POA: Diagnosis not present

## 2017-01-09 LAB — COMPREHENSIVE METABOLIC PANEL
ALK PHOS: 173 U/L — AB (ref 38–126)
ALT: 23 U/L (ref 14–54)
ANION GAP: 11 (ref 5–15)
AST: 26 U/L (ref 15–41)
Albumin: 3.8 g/dL (ref 3.5–5.0)
BILIRUBIN TOTAL: 0.5 mg/dL (ref 0.3–1.2)
BUN: 9 mg/dL (ref 6–20)
CALCIUM: 9.2 mg/dL (ref 8.9–10.3)
CO2: 21 mmol/L — ABNORMAL LOW (ref 22–32)
Chloride: 109 mmol/L (ref 101–111)
Creatinine, Ser: 0.93 mg/dL (ref 0.44–1.00)
Glucose, Bld: 146 mg/dL — ABNORMAL HIGH (ref 65–99)
POTASSIUM: 3.7 mmol/L (ref 3.5–5.1)
Sodium: 141 mmol/L (ref 135–145)
TOTAL PROTEIN: 7.1 g/dL (ref 6.5–8.1)

## 2017-01-09 LAB — APTT: aPTT: 31 seconds (ref 24–36)

## 2017-01-09 LAB — CBC
HEMATOCRIT: 42 % (ref 36.0–46.0)
HEMOGLOBIN: 14.1 g/dL (ref 12.0–15.0)
MCH: 27.4 pg (ref 26.0–34.0)
MCHC: 33.6 g/dL (ref 30.0–36.0)
MCV: 81.6 fL (ref 78.0–100.0)
Platelets: 257 10*3/uL (ref 150–400)
RBC: 5.15 MIL/uL — ABNORMAL HIGH (ref 3.87–5.11)
RDW: 12.9 % (ref 11.5–15.5)
WBC: 10.6 10*3/uL — ABNORMAL HIGH (ref 4.0–10.5)

## 2017-01-09 LAB — DIFFERENTIAL
Basophils Absolute: 0.1 10*3/uL (ref 0.0–0.1)
Basophils Relative: 1 %
EOS ABS: 0.2 10*3/uL (ref 0.0–0.7)
EOS PCT: 2 %
LYMPHS ABS: 2.8 10*3/uL (ref 0.7–4.0)
Lymphocytes Relative: 26 %
MONO ABS: 1.1 10*3/uL — AB (ref 0.1–1.0)
MONOS PCT: 10 %
NEUTROS PCT: 61 %
Neutro Abs: 6.6 10*3/uL (ref 1.7–7.7)

## 2017-01-09 LAB — I-STAT CHEM 8, ED
BUN: 9 mg/dL (ref 6–20)
CALCIUM ION: 1.08 mmol/L — AB (ref 1.15–1.40)
Chloride: 107 mmol/L (ref 101–111)
Creatinine, Ser: 0.8 mg/dL (ref 0.44–1.00)
Glucose, Bld: 149 mg/dL — ABNORMAL HIGH (ref 65–99)
HEMATOCRIT: 42 % (ref 36.0–46.0)
HEMOGLOBIN: 14.3 g/dL (ref 12.0–15.0)
Potassium: 3.6 mmol/L (ref 3.5–5.1)
SODIUM: 142 mmol/L (ref 135–145)
TCO2: 25 mmol/L (ref 22–32)

## 2017-01-09 LAB — PROTIME-INR
INR: 0.93
Prothrombin Time: 12.4 seconds (ref 11.4–15.2)

## 2017-01-09 LAB — I-STAT TROPONIN, ED: TROPONIN I, POC: 0 ng/mL (ref 0.00–0.08)

## 2017-01-09 MED ORDER — SENNOSIDES-DOCUSATE SODIUM 8.6-50 MG PO TABS: 1.0000 | ORAL_TABLET | Freq: Every evening | ORAL | Status: DC | PRN

## 2017-01-09 MED ORDER — HYDRALAZINE HCL 20 MG/ML IJ SOLN: 10.0000 mg | INTRAMUSCULAR | Status: DC | PRN

## 2017-01-09 MED ORDER — LORAZEPAM 2 MG/ML IJ SOLN
0.5000 mg | Freq: Once | INTRAMUSCULAR | Status: AC | PRN
  Administered 2017-01-09: 0.5 mg via INTRAVENOUS
  Filled 2017-01-09: qty 1

## 2017-01-09 MED ORDER — ACETAMINOPHEN 160 MG/5ML PO SOLN: 650.0000 mg | ORAL | Status: DC | PRN

## 2017-01-09 MED ORDER — PANTOPRAZOLE SODIUM 40 MG PO TBEC
40.0000 mg | DELAYED_RELEASE_TABLET | Freq: Every day | ORAL | Status: DC
  Administered 2017-01-10: 40 mg via ORAL
  Filled 2017-01-09: qty 1

## 2017-01-09 MED ORDER — ROSUVASTATIN CALCIUM 5 MG PO TABS: 10.0000 mg | ORAL_TABLET | Freq: Every evening | ORAL | Status: DC

## 2017-01-09 MED ORDER — STROKE: EARLY STAGES OF RECOVERY BOOK
Freq: Once | Status: AC
  Administered 2017-01-09
  Filled 2017-01-09: qty 1

## 2017-01-09 MED ORDER — ACETAMINOPHEN 650 MG RE SUPP: 650.0000 mg | RECTAL | Status: DC | PRN

## 2017-01-09 MED ORDER — ACETAMINOPHEN 325 MG PO TABS: 650.0000 mg | ORAL_TABLET | ORAL | Status: DC | PRN

## 2017-01-09 MED ORDER — ASPIRIN 81 MG PO CHEW
324.0000 mg | CHEWABLE_TABLET | Freq: Once | ORAL | Status: AC
  Administered 2017-01-09: 324 mg via ORAL
  Filled 2017-01-09: qty 4

## 2017-01-09 MED ORDER — TRIAZOLAM 0.125 MG PO TABS
0.2500 mg | ORAL_TABLET | Freq: Every day | ORAL | Status: DC
  Administered 2017-01-09: 0.25 mg via ORAL
  Filled 2017-01-09: qty 2

## 2017-01-09 MED ORDER — LOSARTAN POTASSIUM 50 MG PO TABS: 100.0000 mg | ORAL_TABLET | Freq: Every evening | ORAL | Status: DC

## 2017-01-09 MED ORDER — METOPROLOL TARTRATE 12.5 MG HALF TABLET
12.5000 mg | ORAL_TABLET | Freq: Every day | ORAL | Status: DC
  Filled 2017-01-09: qty 1

## 2017-01-09 MED ORDER — METOPROLOL TARTRATE 25 MG PO TABS
25.0000 mg | ORAL_TABLET | Freq: Every day | ORAL | Status: DC
  Administered 2017-01-10: 25 mg via ORAL
  Filled 2017-01-09: qty 1

## 2017-01-09 MED ORDER — FLUTICASONE PROPIONATE 50 MCG/ACT NA SUSP
2.0000 | Freq: Every day | NASAL | Status: DC
  Administered 2017-01-10: 2 via NASAL
  Filled 2017-01-09: qty 16

## 2017-01-09 MED ORDER — ENOXAPARIN SODIUM 40 MG/0.4ML ~~LOC~~ SOLN
40.0000 mg | SUBCUTANEOUS | Status: DC
  Filled 2017-01-09: qty 0.4

## 2017-01-09 NOTE — ED Notes (Signed)
Attempted report x3. RN still unavailalbe. Will do bedside.

## 2017-01-09 NOTE — Code Documentation (Addendum)
61 y.o. Female with stroke risk factors of HTN and HLD who is stated to have had an acute onset of right sided hemiparesis with some decreased sensation to her RUE. LKW 1430. Patient arrived via POV and code stroke called in triage. Patient educated on not waiting to seek help for stroke symptoms and to call 911. Upon assessment, patient c/c is of RUE pain. NIHSS 0. See EMR for NIHSS and code stroke times. CT with no acute intracranial abnormalities. ASPECTS 10. IV tPA not given d/t being out of the window.

## 2017-01-09 NOTE — Progress Notes (Signed)
Received from ED, bedside report due to location change, report not delayed due to this RN.  Patient A&O x4, denies pain, oriented to room, unit, plan of care & safety precautions.  Tele applied & verified with CCMD.

## 2017-01-09 NOTE — Consult Note (Addendum)
Neurology Consultation Reason for Consult: Right-sided weakness Referring Physician: Ellender Hose, C  CC: Right-sided weakness  History is obtained from: Patient  HPI: [name] is a 61 y.o. female with a history of hypertension who presents with right arm and leg weakness and numbness that is improving. She states that started at 2 PM we'll she was getting gas. When she was driving she noticed that her right foot was not working appropriately either. She therefore sought care in the emergency department. At the time of arrival here, she was improving but she still felt like her right arm and leg were heavier than typical.   LKW: 2 PM tpa given?: no, out of window, mild symptoms NIHSS: 0  ROS: A 14 point ROS was performed and is negative except as noted in the HPI.   Past Medical History:  Diagnosis Date  . Chest discomfort   . GERD (gastroesophageal reflux disease)   . High cholesterol   . Hypertension   . Joint pain   . 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 Osteoporosis    T SCORE OF -2.7 ON DEXA IN 2013     Family History  Problem Relation Age of Onset  . Microcephaly Father   . CAD Father   . Heart disease Sister        atrial fibrillation  . Heart attack Brother        died 33  . Colon polyps Mother   . Hypertension Mother   . Colon cancer Neg Hx   . Liver disease Neg Hx      Social History:  reports that she quit smoking about 8 years ago. Her smoking use included Cigarettes. She has a 87.50 pack-year smoking history. She has never used smokeless tobacco. She reports that she does not drink alcohol or use drugs.   Exam: Current vital signs: BP (!) 177/74 (BP Location: Right Arm)   Pulse 73   Temp 98.3 F (36.8 C) (Oral)   Resp 18   Ht 5\' 1"  (1.549 m)   Wt 85.3 kg (188 lb)   SpO2 95%   BMI 35.52 kg/m  Vital signs in last 24 hours: Temp:  [98.3 F (36.8 C)] 98.3 F (36.8 C) (08/28 1838) Pulse Rate:  [73-84] 73 (08/28 1915) Resp:  [16-18] 18 (08/28  1915) BP: (177-192)/(74-87) 177/74 (08/28 1915) SpO2:  [95 %-96 %] 95 % (08/28 1915) Weight:  [85.3 kg (188 lb)] 85.3 kg (188 lb) (08/28 1838)   Physical Exam  Constitutional: Appears well-developed and well-nourished.  Psych: Affect appropriate to situation Eyes: No scleral injection HENT: No OP obstrucion Head: Normocephalic.  Cardiovascular: Normal rate and regular rhythm.  Respiratory: Effort normal and breath sounds normal to anterior ascultation GI: Soft.  No distension. There is no tenderness.  Skin: WDI  Neuro: Mental Status: Patient is awake, alert, oriented to person, place, month, year, and situation. Patient is able to give a clear and coherent history. No signs of aphasia or neglect Cranial Nerves: II: Visual Fields are full. Pupils are equal, round, and reactive to light.   III,IV, VI: EOMI without ptosis or diploplia.  V: Facial sensation is symmetric to temperature VII: Facial movement is symmetric.  VIII: hearing is intact to voice X: Uvula elevates symmetrically XI: Shoulder shrug is symmetric. XII: tongue is midline without atrophy or fasciculations.  Motor: Tone is normal. Bulk is normal. 5/5 strength was present in all four extremities.  Sensory: Sensation is symmetric to light touch  and temperature in the arms and legs. Deep Tendon Reflexes: 2+ and symmetric in the biceps and patellae.  Plantars: Toes are downgoing bilaterally.  Cerebellar: FNF and HKS are intact bilaterally      I have reviewed labs in epic and the results pertinent to this consultation are: Chem 8-unremarkable  I have reviewed the images obtained: CT head-negative  Impression: 61 year old female with right arm and leg weakness and numbness that is improving. I suspect that this is either small ischemic infarct versus TIA. I would favor admission for workup of such. Of note, the patient reported to me a history of atrial fibrillation, but according to her most recent  cardiology note that I see(12/17) it only mentions PACs and PVCs, not atrial fibrillation.  Recommendations: 1. HgbA1c, fasting lipid panel 2. MRI, MRA  of the brain without contrast 3. Frequent neuro checks 4. Echocardiogram 5. Carotid dopplers 6. Prophylactic therapy-Antiplatelet med: Aspirin - dose 325mg  PO or 300mg  PR 7. Risk factor modification 8. Telemetry monitoring 9. PT consult, OT consult, Speech consult 10. please page stroke NP  Or  PA  Or MD  from 8am -4 pm as this patient will be followed by the stroke team at this point.   You can look them up on [email or web address]      Roland Rack, MD [facility] [phone]  If 7pm- 7am, please page neurology on call as listed in [place].

## 2017-01-09 NOTE — ED Notes (Signed)
Code stroke activated @ 3383

## 2017-01-09 NOTE — ED Notes (Signed)
Attempted report x1, RN unavailable  

## 2017-01-09 NOTE — ED Provider Notes (Addendum)
[facility] DEPT Provider Note   CSN: [id number] Arrival date & time: 01/09/17  4098   An emergency department physician performed an initial assessment on this suspected stroke patient at [facility].  History   Chief Complaint Chief Complaint  Patient presents with  . Code Stroke    HPI [name] is a 61 y.o. female.  HPI   61 year old female with past medical history as below who presents as a code stroke. The patient states that her last known normal was around 2:30 this afternoon. She is trying to write with her right hand when she felt like it was numb and heavy. She had difficulty writing due to her hand feeling clumsy and had difficulty moving it. Later today, she states that her weakness and numbness in the right arm have persisted and she was trying to walk at the gas station when she noticed that her right foot felt heavier and clumsier than it normally does. She subsequent presents for evaluation. Denies any vision changes. No numbness or weakness of the left side. Denies any headaches. She has no history of stroke but does have high blood pressure and high cholesterol. She denies any difficulty walking, though she did have to pay attention to her right foot while walking. Denies any dysarthria or dysphasia. No double vision.  Past Medical History:  Diagnosis Date  . Chest discomfort   . GERD (gastroesophageal reflux disease)   . High cholesterol   . Hypertension   . Joint pain   . 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 Osteoporosis    T SCORE OF -2.7 ON DEXA IN 2013    Patient Active Problem List   Diagnosis Date Noted  . Right sided weakness 01/09/2017  . Chest pain, rule out acute myocardial infarction 01/09/2016  . CLOSED FRACTURE OF UNSPECIFIED PART OF FIBULA 02/15/2010  . FACIAL RASH 01/26/2010  . COUGH 01/26/2010  . VOMITING 01/26/2010  . MOTION SICKNESS 12/08/2009  . CHOLECYSTECTOMY, LAPAROSCOPIC, HX OF 12/08/2009  . CHOLELITHIASIS 10/20/2009  .  LIVER FUNCTION TESTS, ABNORMAL, HX OF 10/03/2009  . VIRAL URI 09/23/2009  . SEBORRHEA 09/23/2009  . SHORTNESS OF BREATH 09/23/2009  . INSOMNIA 07/21/2009  . EDEMA 07/21/2009  . ACUTE FRONTAL SINUSITIS 06/10/2009  . WEIGHT GAIN 06/10/2009  . ACUTE BRONCHITIS 05/27/2009  . ACTINIC KERATOSIS 03/29/2009  . VITAMIN D DEFICIENCY 02/23/2009  . DEPRESSION 02/23/2009  . ALLERGIC RHINITIS 02/23/2009  . CONSTIPATION 02/23/2009  . IRRITABLE BOWEL SYNDROME 02/23/2009  . BENIGN POSITIONAL VERTIGO, HX OF 02/12/2009  . DYSLIPIDEMIA 12/29/2008  . ESSENTIAL HYPERTENSION, BENIGN 12/29/2008  . GERD 12/29/2008  . TOBACCO USE, QUIT 12/29/2008    Past Surgical History:  Procedure Laterality Date  . APPENDECTOMY    . CHOLECYSTECTOMY      OB History    No data available       Home Medications    Prior to Admission medications   Medication Sig Start Date End Date Taking? Authorizing Provider  alendronate (FOSAMAX) 70 MG tablet Take 70 mg by mouth every Sunday. Take with a full glass of water on an empty stomach.   Yes [provider]  celecoxib (CELEBREX) 200 MG capsule Take 200 mg by mouth daily as needed for mild pain.   Yes [provider]  dexlansoprazole (DEXILANT) 60 MG capsule Take 60 mg by mouth daily.   Yes [provider]  fluticasone (FLONASE) 50 MCG/ACT nasal spray Place 2 sprays into both nostrils daily.   Yes  [provider]  losartan (COZAAR) 50 MG tablet Take 100 mg by mouth every evening.   Yes [provider]  metoprolol tartrate (LOPRESSOR) 25 MG tablet Take 0.5 tablets (12.5 mg total) by mouth 2 (two) times daily. Patient taking differently: Take 12.5-25 mg by mouth See admin instructions. 25 mg in the morning 12.5 mg in the evening 04/20/16  Yes End, Harrell Gave, MD  rosuvastatin (CRESTOR) 10 MG tablet Take 10 mg by mouth every evening.   Yes [provider]  triazolam (HALCION) 0.25 MG tablet Take 0.25 mg by mouth at  bedtime.   Yes [provider]    Family History Family History  Problem Relation Age of Onset  . Microcephaly Father   . CAD Father   . Heart disease Sister        atrial fibrillation  . Heart attack Brother        died 43  . Colon polyps Mother   . Hypertension Mother   . Colon cancer Neg Hx   . Liver disease Neg Hx     Social History Social History  Substance Use Topics  . Smoking status: Former Smoker    Packs/day: 2.50    Years: 35.00    Types: Cigarettes    Quit date: 12/24/2008  . Smokeless tobacco: Never Used  . Alcohol use No     Allergies   Codeine   Review of Systems Review of Systems  Constitutional: Positive for fatigue. Negative for chills and fever.  HENT: Negative for congestion, rhinorrhea and sore throat.   Eyes: Negative for visual disturbance.  Respiratory: Negative for cough, shortness of breath and wheezing.   Cardiovascular: Negative for chest pain and leg swelling.  Gastrointestinal: Negative for abdominal pain, diarrhea, nausea and vomiting.  Genitourinary: Negative for dysuria, flank pain, vaginal bleeding and vaginal discharge.  Musculoskeletal: Negative for neck pain.  Skin: Negative for rash.  Allergic/Immunologic: Negative for immunocompromised state.  Neurological: Positive for weakness and numbness. Negative for syncope and headaches.  Hematological: Does not bruise/bleed easily.  All other systems reviewed and are negative.    Physical Exam Updated Vital Signs BP (!) 162/75 (BP Location: Left Arm)   Pulse 65   Temp 97.9 F (36.6 C) (Oral)   Resp 20   Ht '5\' 1"'  (1.549 m)   Wt 84.6 kg (186 lb 6.4 oz)   SpO2 96%   BMI 35.22 kg/m   Physical Exam  Constitutional: She is oriented to person, place, and time. She appears well-developed and well-nourished. No distress.  HENT:  Head: Normocephalic and atraumatic.  Eyes: Conjunctivae are normal.  Neck: Neck supple.  Cardiovascular: Normal rate, regular rhythm and  normal heart sounds.  Exam reveals no friction rub.   No murmur heard. Pulmonary/Chest: Effort normal and breath sounds normal. No respiratory distress. She has no wheezes. She has no rales.  Abdominal: She exhibits no distension.  Musculoskeletal: She exhibits no edema.  Neurological: She is alert and oriented to person, place, and time. She exhibits normal muscle tone.  Skin: Skin is warm. Capillary refill takes less than 2 seconds.  Psychiatric: She has a normal mood and affect.  Nursing note and vitals reviewed.   Neurological Exam:  Mental Status: Alert and oriented to person, place, and time. Attention and concentration normal. Speech clear. Recent memory is intact. Cranial Nerves: Visual fields grossly intact. EOMI and PERRLA. No nystagmus noted. Facial sensation intact at forehead, maxillary cheek, and chin/mandible bilaterally. No facial asymmetry or weakness.  Hearing grossly normal. Uvula is midline, and palate elevates symmetrically. Normal SCM and trapezius strength. Tongue midline without fasciculations. Motor: Muscle strength 5/5 in proximal and distal UE and LE bilaterally. No pronator drift. Muscle tone normal. Reflexes: 2+ and symmetrical in all four extremities.  Sensation: Intact to light touch in upper and lower extremities distally bilaterally.  Gait: Normal without ataxia. Coordination: Normal FTN bilaterally.     ED Treatments / Results  Labs (all labs ordered are listed, but only abnormal results are displayed) Labs Reviewed  CBC - Abnormal; Notable for the following:       Result Value   WBC 10.6 (*)    RBC 5.15 (*)    All other components within normal limits  DIFFERENTIAL - Abnormal; Notable for the following:    Monocytes Absolute 1.1 (*)    All other components within normal limits  COMPREHENSIVE METABOLIC PANEL - Abnormal; Notable for the following:    CO2 21 (*)    Glucose, Bld 146 (*)    Alkaline Phosphatase 173 (*)    All other components within  normal limits  I-STAT CHEM 8, ED - Abnormal; Notable for the following:    Glucose, Bld 149 (*)    Calcium, Ion 1.08 (*)    All other components within normal limits  PROTIME-INR  APTT  HIV ANTIBODY (ROUTINE TESTING)  HEMOGLOBIN A1C  LIPID PANEL  CBC WITH DIFFERENTIAL/PLATELET  BASIC METABOLIC PANEL  I-STAT TROPONIN, ED  CBG MONITORING, ED    EKG  EKG Interpretation  Date/Time:  Tuesday January 09 2017 19:14:31 EDT Ventricular Rate:  67 PR Interval:    QRS Duration: 90 QT Interval:  401 QTC Calculation: 424 R Axis:   62 Text Interpretation:  Sinus rhythm No significant change since last tracing Confirmed by Duffy Bruce [phone]) on 01/10/2017 3:01:36 AM       Radiology Mr Brain Wo Contrast  Result Date: 01/09/2017 CLINICAL DATA:  RIGHT arm and leg weakness and numbness, beginning at 2 p.m. while driving. Symptoms now improving. Assess transient ischemic attack. History of hypertension and hypercholesterolemia. EXAM: MRI HEAD WITHOUT CONTRAST MRA HEAD WITHOUT CONTRAST TECHNIQUE: Multiplanar, multiecho pulse sequences of the brain and surrounding structures were obtained without intravenous contrast. Angiographic images of the head were obtained using MRA technique without contrast. COMPARISON:  CT HEAD January 09, 2017 at 1858 hours FINDINGS: MRI HEAD FINDINGS BRAIN: No reduced diffusion to suggest acute ischemia. No susceptibility artifact to suggest hemorrhage. The ventricles and sulci are normal for patient's age. Scattered subcentimeter supratentorial white matter FLAIR T2 hyperintensities. No suspicious parenchymal signal, mass or mass effect. No abnormal extra-axial fluid collections. VASCULAR: Normal major intracranial vascular flow voids present at skull base. SKULL AND UPPER CERVICAL SPINE: No abnormal sellar expansion. No suspicious calvarial bone marrow signal. Craniocervical junction maintained. SINUSES/ORBITS: Paranasal sinuses are well-aerated. Mastoid air cells are well  aerated. The included ocular globes and orbital contents are non-suspicious. OTHER: None. MRA HEAD FINDINGS ANTERIOR CIRCULATION: Normal flow related enhancement of the included cervical, petrous, cavernous and supraclinoid internal carotid arteries. Patent anterior communicating artery. Patent anterior and middle cerebral arteries, including distal segments. Moderate luminal irregularity RIGHT greater than LEFT mid and distal middle cerebral artery's. No large vessel occlusion, high-grade stenosis, aneurysm. POSTERIOR CIRCULATION: Codominant vertebral arteries. Basilar artery is patent, with normal flow related enhancement of the main branch vessels. Patent posterior cerebral arteries, mild luminal irregularity. Robust RIGHT posterior communicating artery present. No large vessel occlusion, high-grade stenosis,  aneurysm. ANATOMIC VARIANTS:  None. Source images and MIP images were reviewed. IMPRESSION: MRI HEAD: 1. No acute intracranial process. 2. Mild chronic small vessel ischemic disease. MRA HEAD: 1. No emergent large vessel occlusion or severe stenosis. 2. Moderate middle cerebral arteries and mild posterior cerebral arteries luminal regularity compatible atherosclerosis, less likely vasculopathy. Electronically Signed   By: Elon Alas M.D.   On: 01/09/2017 22:52   Mr Jodene Nam Head Wo Contrast  Result Date: 01/09/2017 CLINICAL DATA:  RIGHT arm and leg weakness and numbness, beginning at 2 p.m. while driving. Symptoms now improving. Assess transient ischemic attack. History of hypertension and hypercholesterolemia. EXAM: MRI HEAD WITHOUT CONTRAST MRA HEAD WITHOUT CONTRAST TECHNIQUE: Multiplanar, multiecho pulse sequences of the brain and surrounding structures were obtained without intravenous contrast. Angiographic images of the head were obtained using MRA technique without contrast. COMPARISON:  CT HEAD January 09, 2017 at 1858 hours FINDINGS: MRI HEAD FINDINGS BRAIN: No reduced diffusion to suggest  acute ischemia. No susceptibility artifact to suggest hemorrhage. The ventricles and sulci are normal for patient's age. Scattered subcentimeter supratentorial white matter FLAIR T2 hyperintensities. No suspicious parenchymal signal, mass or mass effect. No abnormal extra-axial fluid collections. VASCULAR: Normal major intracranial vascular flow voids present at skull base. SKULL AND UPPER CERVICAL SPINE: No abnormal sellar expansion. No suspicious calvarial bone marrow signal. Craniocervical junction maintained. SINUSES/ORBITS: Paranasal sinuses are well-aerated. Mastoid air cells are well aerated. The included ocular globes and orbital contents are non-suspicious. OTHER: None. MRA HEAD FINDINGS ANTERIOR CIRCULATION: Normal flow related enhancement of the included cervical, petrous, cavernous and supraclinoid internal carotid arteries. Patent anterior communicating artery. Patent anterior and middle cerebral arteries, including distal segments. Moderate luminal irregularity RIGHT greater than LEFT mid and distal middle cerebral artery's. No large vessel occlusion, high-grade stenosis, aneurysm. POSTERIOR CIRCULATION: Codominant vertebral arteries. Basilar artery is patent, with normal flow related enhancement of the main branch vessels. Patent posterior cerebral arteries, mild luminal irregularity. Robust RIGHT posterior communicating artery present. No large vessel occlusion, high-grade stenosis,  aneurysm. ANATOMIC VARIANTS: None. Source images and MIP images were reviewed. IMPRESSION: MRI HEAD: 1. No acute intracranial process. 2. Mild chronic small vessel ischemic disease. MRA HEAD: 1. No emergent large vessel occlusion or severe stenosis. 2. Moderate middle cerebral arteries and mild posterior cerebral arteries luminal regularity compatible atherosclerosis, less likely vasculopathy. Electronically Signed   By: Elon Alas M.D.   On: 01/09/2017 22:52   Ct Head Code Stroke Wo Contrast  Result Date:  01/09/2017 CLINICAL DATA:  Code stroke. 61 year old female with right arm weakness. Last seen normal 1044 hours. EXAM: CT HEAD WITHOUT CONTRAST TECHNIQUE: Contiguous axial images were obtained from the base of the skull through the vertex without intravenous contrast. COMPARISON:  None. FINDINGS: Brain: Cerebral volume is within normal limits for age. No midline shift, ventriculomegaly, mass effect, evidence of mass lesion, intracranial hemorrhage or evidence of cortically based acute infarction. Gray-white matter differentiation is within normal limits throughout the brain. Vascular: Calcified atherosclerosis at the skull base. No suspicious intracranial vascular hyperdensity. Skull: Osteopenia. Scattered small indeterminate lucent lesions in the skull (e.g. 7 mm right frontal bone series 4, image 43). No definitely destructive bone lesions. Sinuses/Orbits: Clear. Other: Disc conjugate gaze, otherwise negative orbits soft tissues. Visualized scalp soft tissues are within normal limits. ASPECTS [facility] Stroke Program Early CT Score) - Ganglionic level infarction (caudate, lentiform nuclei, internal capsule, insula, M1-M3 cortex): 7 - Supraganglionic infarction (M4-M6 cortex): 3 Total score (0-10 with 10 being normal): 10 IMPRESSION: 1. Normal for age  Normal noncontrast CT appearance of the brain. 2. ASPECTS is 10. 3. Multiple subcentimeter lucent lesions in the calvarium are indeterminate. Consider serum and urine protein electrophoresis correlation to exclude Multiple Myeloma. 4. Salient findings were relayed via text pager to Dr. Roland Rack on 01/09/2017 at 1903 hours. Electronically Signed   By: Genevie Ann M.D.   On: 01/09/2017 19:05    Procedures Procedures (including critical care time)  Medications Ordered in ED Medications  rosuvastatin (CRESTOR) tablet 10 mg (not administered)  pantoprazole (PROTONIX) EC tablet 40 mg (not administered)  triazolam (HALCION) tablet 0.25 mg (0.25 mg Oral Given  01/09/17 2330)  fluticasone (FLONASE) 50 MCG/ACT nasal spray 2 spray (not administered)  acetaminophen (TYLENOL) tablet 650 mg (not administered)    Or  acetaminophen (TYLENOL) solution 650 mg (not administered)    Or  acetaminophen (TYLENOL) suppository 650 mg (not administered)  senna-docusate (Senokot-S) tablet 1 tablet (not administered)  enoxaparin (LOVENOX) injection 40 mg (not administered)  metoprolol tartrate (LOPRESSOR) tablet 12.5 mg (12.5 mg Oral Not Given 01/09/17 2331)  losartan (COZAAR) tablet 100 mg (not administered)  hydrALAZINE (APRESOLINE) injection 10 mg (not administered)  metoprolol tartrate (LOPRESSOR) tablet 25 mg (not administered)  aspirin chewable tablet 324 mg (324 mg Oral Given 01/09/17 2045)   stroke: mapping our early stages of recovery book ( Does not apply Given 01/09/17 2330)  LORazepam (ATIVAN) injection 0.5 mg (0.5 mg Intravenous Given 01/09/17 2148)     Initial Impression / Assessment and Plan / ED Course  I have reviewed the triage vital signs and the nursing notes.  Pertinent labs & imaging results that were available during my care of the patient were reviewed by me and considered in my medical decision making (see chart for details).    61 year old female with past medical history as above here with right arm and leg numbness and weakness, which is improving but persistent on exam. Concern for CVA versus TIA. CT head shows no acute abnormality. She is outside the TPA window. Discussed case with neurology, will admit for TIA versus CVA workup with MRI. ASA given.  Final Clinical Impressions(s) / ED Diagnoses   Final diagnoses:  Right arm numbness  Right leg weakness    New Prescriptions Current Discharge Medication List       Duffy Bruce, MD 01/10/17 Duffy Bruce    Duffy Bruce, MD 01/10/17 [phone]

## 2017-01-09 NOTE — H&P (Signed)
History and Physical    [name] OXB:[id number] DOB: [birth year] DOA: 01/09/2017  Referring MD/NP/PA: Dr. Fonnie Birkenhead PCP: Marda Stalker, PA-C  Patient coming from:  Brought in by husband  Chief Complaint: Right-sided weakness  HPI: [name] is a 61 y.o. right hand dominant female with medical history significant of HTN, HLD, remote history of tobacco abuse, and [name]; who presented with complaints of right-sided weakness. 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 She first noticed a tingling/numbness in her right fingers andthat she was having difficulty signing the papers. Thereafter, she went to pump gas and while getting out of a car noted that she was dragging her right foot. Denies any slurred speech, changes in vision, chest pain, nausea, vomiting, diarrhea, or dysuria symptoms. Associated symptoms included palpitations and some right arm pain. Patient reports previously being diagnosed by Dr. Vicente Males cardiology with atrial fibrillation, but review of records notes that she was evaluated by Dr. Saunders Revel for palpitations and never formally diagnosed with A. Fib it seems.  ED Course: Patient came in as a code stroke was seen by Dr. Leonel Ramsay of neurology. Initial CT scan of the brain was negative for any signs of a acute stroke. Patient was out of the window to receive TPA. Vital signs revealed elevated blood pressure up to 192/87 on the other was also within normal limits. Labs revealed WBC 10.6, and other labs relatively within normal limits. Patient was given full dose aspirin. TRH called to admit to complete stroke workup.   Review of Systems: Review of Systems  Constitutional: Negative for chills, diaphoresis and fever.  HENT: Negative for ear discharge and ear pain.   Eyes: Negative for blurred vision, photophobia and pain.  Respiratory: Negative for cough, sputum production and shortness of breath.   Cardiovascular: Positive for  palpitations. Negative for chest pain and leg swelling.  Gastrointestinal: Negative for abdominal pain, nausea and vomiting.  Genitourinary: Negative for dysuria and urgency.  Musculoskeletal: Negative for falls and joint pain.  Skin: Negative for itching and rash.  Neurological: Positive for focal weakness. Negative for speech change.  Psychiatric/Behavioral: Negative for hallucinations and substance abuse.    Past Medical History:  Diagnosis Date  . Chest discomfort   . GERD (gastroesophageal reflux disease)   . High cholesterol   . Hypertension   . Joint pain   . 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 Osteoporosis    T SCORE OF -2.7 ON DEXA IN 2013    Past Surgical History:  Procedure Laterality Date  . APPENDECTOMY    . CHOLECYSTECTOMY       reports that she quit smoking about 8 years ago. Her smoking use included Cigarettes. She has a 87.50 pack-year smoking history. She has never used smokeless tobacco. She reports that she does not drink alcohol or use drugs.  Allergies  Allergen Reactions  . Codeine Nausea And Vomiting    Family History  Problem Relation Age of Onset  . Microcephaly Father   . CAD Father   . Heart disease Sister        atrial fibrillation  . Heart attack Brother        died 81  . Colon polyps Mother   . Hypertension Mother   . Colon cancer Neg Hx   . Liver disease Neg Hx     Prior to Admission medications   Medication Sig Start Date End Date Taking? Authorizing Provider  alendronate (  FOSAMAX) 70 MG tablet Take 70 mg by mouth every Sunday. Take with a full glass of water on an empty stomach.   Yes [provider]  celecoxib (CELEBREX) 200 MG capsule Take 200 mg by mouth daily as needed for mild pain.   Yes [provider]  dexlansoprazole (DEXILANT) 60 MG capsule Take 60 mg by mouth daily.   Yes [provider]  fluticasone (FLONASE) 50 MCG/ACT nasal spray Place 2 sprays into both nostrils daily.   Yes [provider]  losartan (COZAAR) 50 MG tablet Take 100 mg by mouth every evening.   Yes [provider]  metoprolol tartrate (LOPRESSOR) 25 MG tablet Take 0.5 tablets (12.5 mg total) by mouth 2 (two) times daily. Patient taking differently: Take 12.5-25 mg by mouth See admin instructions. 25 mg in the morning 12.5 mg in the evening 04/20/16  Yes End, Harrell Gave, MD  rosuvastatin (CRESTOR) 10 MG tablet Take 10 mg by mouth every evening.   Yes [provider]  triazolam (HALCION) 0.25 MG tablet Take 0.25 mg by mouth at bedtime.   Yes [provider]    Physical Exam:  Constitutional: NAD, calm, comfortable Vitals:   01/09/17 1838 01/09/17 1915  BP: (!) 192/87 (!) 177/74  Pulse: 84 73  Resp: 16 18  Temp: 98.3 F (36.8 C)   TempSrc: Oral   SpO2: 96% 95%  Weight: 85.3 kg (188 lb)   Height: _0  (1.549 m)    Eyes: PERRL, lids and conjunctivae normal ENMT: Mucous membranes are moist. Posterior pharynx clear of any exudate or lesions.Normal dentition.  Neck: normal, supple, no masses, no thyromegaly Respiratory: clear to auscultation bilaterally, no wheezing, no crackles. Normal respiratory effort. No accessory muscle use.  Cardiovascular: Regular rate and rhythm, no murmurs / rubs / gallops. No extremity edema. 2+ pedal pulses. No carotid bruits.  Abdomen: no tenderness, no masses palpated. No hepatosplenomegaly. Bowel sounds positive.  Musculoskeletal: no clubbing / cyanosis. No joint deformity upper and lower extremities. Good ROM, no contractures. Normal muscle tone.  Skin: no rashes, lesions, ulcers. No induration Neurologic: CN 2-12 grossly intact. Sensation intact, DTR normal. Strength 5/5 on the left upper and lower extremity and 4+/5 in the right upper and lower extremity with slight right foot drift.  Psychiatric: Normal judgment and insight. Alert and oriented x 3. Normal mood.     Labs on Admission: I have personally reviewed following labs and  imaging studies  CBC:  Recent Labs Lab 01/09/17 1913 01/09/17 1927  WBC 10.6*  --   NEUTROABS 6.6  --   HGB 14.1 14.3  HCT 42.0 42.0  MCV 81.6  --   PLT 257  --    Basic Metabolic Panel:  Recent Labs Lab 01/09/17 1927  NA 142  K 3.6  CL 107  GLUCOSE 149*  BUN 9  CREATININE 0.80   GFR: Estimated Creatinine Clearance: 72.3 mL/min (by C-G formula based on SCr of 0.8 mg/dL). Liver Function Tests: No results for input(s): AST, ALT, ALKPHOS, BILITOT, PROT, ALBUMIN in the last 168 hours. No results for input(s): LIPASE, AMYLASE in the last 168 hours. No results for input(s): AMMONIA in the last 168 hours. Coagulation Profile:  Recent Labs Lab 01/09/17 1913  INR 0.93   Cardiac Enzymes: No results for input(s): CKTOTAL, CKMB, CKMBINDEX, TROPONINI in the last 168 hours. BNP (last 3 results) No results for input(s): PROBNP in the last 8760 hours. HbA1C: No results for input(s): HGBA1C in the last  72 hours. CBG: No results for input(s): GLUCAP in the last 168 hours. Lipid Profile: No results for input(s): CHOL, HDL, LDLCALC, TRIG, CHOLHDL, LDLDIRECT in the last 72 hours. Thyroid Function Tests: No results for input(s): TSH, T4TOTAL, FREET4, T3FREE, THYROIDAB in the last 72 hours. Anemia Panel: No results for input(s): VITAMINB12, FOLATE, FERRITIN, TIBC, IRON, RETICCTPCT in the last 72 hours. Urine analysis:    Component Value Date/Time   COLORURINE YELLOW 06/03/2008 1030   APPEARANCEUR CLEAR 06/03/2008 1030   LABSPEC 1.010 11/04/2009 1806   PHURINE 7.5 11/04/2009 1806   GLUCOSEU NEGATIVE 11/04/2009 1806   HGBUR SMALL (A) 11/04/2009 1806   HGBUR negative 09/27/2009 0809   BILIRUBINUR NEGATIVE 11/04/2009 1806   KETONESUR NEGATIVE 11/04/2009 1806   PROTEINUR NEGATIVE 11/04/2009 1806   UROBILINOGEN 0.2 11/04/2009 1806   NITRITE NEGATIVE 11/04/2009 1806   LEUKOCYTESUR (A) 11/04/2009 1806    LARGE Biochemical Testing Only. Please order routine urinalysis from main  lab if confirmatory testing is needed.   Sepsis Labs: No results found for this or any previous visit (from the past 240 hour(s)).   Radiological Exams on Admission: Ct Head Code Stroke Wo Contrast  Result Date: 01/09/2017 CLINICAL DATA:  Code stroke. 62 year old female with right arm weakness. Last seen normal 1044 hours. EXAM: CT HEAD WITHOUT CONTRAST TECHNIQUE: Contiguous axial images were obtained from the base of the skull through the vertex without intravenous contrast. COMPARISON:  None. FINDINGS: Brain: Cerebral volume is within normal limits for age. No midline shift, ventriculomegaly, mass effect, evidence of mass lesion, intracranial hemorrhage or evidence of cortically based acute infarction. Gray-white matter differentiation is within normal limits throughout the brain. Vascular: Calcified atherosclerosis at the skull base. No suspicious intracranial vascular hyperdensity. Skull: Osteopenia. Scattered small indeterminate lucent lesions in the skull (e.g. 7 mm right frontal bone series 4, image 43). No definitely destructive bone lesions. Sinuses/Orbits: Clear. Other: Disc conjugate gaze, otherwise negative orbits soft tissues. Visualized scalp soft tissues are within normal limits. ASPECTS [facility] Stroke Program Early CT Score) - Ganglionic level infarction (caudate, lentiform nuclei, internal capsule, insula, M1-M3 cortex): 7 - Supraganglionic infarction (M4-M6 cortex): 3 Total score (0-10 with 10 being normal): 10 IMPRESSION: 1. Normal for age Normal noncontrast CT appearance of the brain. 2. ASPECTS is 10. 3. Multiple subcentimeter lucent lesions in the calvarium are indeterminate. Consider serum and urine protein electrophoresis correlation to exclude Multiple Myeloma. 4. Salient findings were relayed via text pager to Dr. Roland Rack on 01/09/2017 at 1903 hours. Electronically Signed   By: Genevie Ann M.D.   On: 01/09/2017 19:05    EKG: Independently reviewed. Normal sinus rhythm    Assessment/Plan Right sided weakness: Acute. She presents with right sided weakness of the arm and leg. On physical exam symptoms appeared to be resolving. CT scan of the brain negative for any acute signs of infarct although multiple subcentimeter lucent lesions in the calvarium are noted. Suspect possibility of a small ischemic stroke versus TIA. - Admit to telemetry bed - Stroke order set initiated - Neuro checks - Check  MRI/MRA head w/o contrast  - PT/OT/Speech to eval and treat - Check echocardiogram and carotid dopplers - Check Hemoglobin A1c and lipid panel in a.m. - ASA - Appreciate neurology consultative services, will follow-up for further recommendations  Leukocytosis: Acute. WBC elevated to 10.6. Patient denies any respiratory or urinary complaints. Could be reactive. - Recheck CBC in a.m.  Lucent lesions in the calvarium:  Incidental finding on CT scan of  the brain. No clear indications at this time to suggest multiple myeloma. - Continue to monitor  Essential hypertension: Blood pressure is elevated up to 192/87. - Allow for permissive hypertension case of underlying stroke - Hydralazine IV prn elevated blood pressure - Restart blood pressure medications losartan and metoprolol in a.m.  Hyperlipidemia - Continue Crestor  GERD - Continue pharmacy Substitution of Protonix  DVT prophylaxis:  lovenox  Code Status: Full Family Communication: Discussed plan of care with patient and family present at bedside. Disposition Plan: Likely discharge home in 1-2 days Consults called: Neurology  Admission status: Observation  Norval Morton MD [facility] Pager [phone]   If 7PM-7AM, please contact night-coverage [email or web address] Password [facility]  01/09/2017, 8:48 PM

## 2017-01-09 NOTE — ED Notes (Signed)
Pt reports pain and weakness in her R arm and R leg around 1430-1500.  No facial droop or slurred speech noted.  Pt is A&O x 4.  Grips strong and equal.  No obvious neuro deficits noted.  Pt reports she was trying to sign her name when her hand felt numb and weak.  Reports when holding her R arm up, it feels like it's going to fall off.

## 2017-01-09 NOTE — ED Triage Notes (Signed)
Pt c/o right arm numbness and weakness and dragging right foot when getting gas this afternoon starting at 1400; code stroke activated

## 2017-01-10 ENCOUNTER — Observation Stay (HOSPITAL_BASED_OUTPATIENT_CLINIC_OR_DEPARTMENT_OTHER): Payer: BLUE CROSS/BLUE SHIELD

## 2017-01-10 ENCOUNTER — Observation Stay (HOSPITAL_COMMUNITY): Payer: BLUE CROSS/BLUE SHIELD

## 2017-01-10 DIAGNOSIS — E785 Hyperlipidemia, unspecified: Secondary | ICD-10-CM | POA: Diagnosis not present

## 2017-01-10 DIAGNOSIS — D72829 Elevated white blood cell count, unspecified: Secondary | ICD-10-CM | POA: Diagnosis not present

## 2017-01-10 DIAGNOSIS — G459 Transient cerebral ischemic attack, unspecified: Secondary | ICD-10-CM | POA: Diagnosis not present

## 2017-01-10 DIAGNOSIS — I10 Essential (primary) hypertension: Secondary | ICD-10-CM

## 2017-01-10 DIAGNOSIS — R531 Weakness: Secondary | ICD-10-CM | POA: Diagnosis not present

## 2017-01-10 DIAGNOSIS — G451 Carotid artery syndrome (hemispheric): Secondary | ICD-10-CM

## 2017-01-10 LAB — VAS US CAROTID
LCCADDIAS: 13 cm/s
LCCAPDIAS: 17 cm/s
LCCAPSYS: 83 cm/s
LEFT ECA DIAS: -10 cm/s
LEFT VERTEBRAL DIAS: 9 cm/s
LICADDIAS: -17 cm/s
LICADSYS: -59 cm/s
LICAPSYS: -34 cm/s
Left CCA dist sys: 62 cm/s
Left ICA prox dias: -11 cm/s
RIGHT ECA DIAS: -14 cm/s
RIGHT VERTEBRAL DIAS: 15 cm/s
Right CCA prox dias: 11 cm/s
Right CCA prox sys: 75 cm/s
Right cca dist sys: -111 cm/s

## 2017-01-10 LAB — CBC WITH DIFFERENTIAL/PLATELET
BASOS ABS: 0.1 10*3/uL (ref 0.0–0.1)
Basophils Relative: 1 %
Eosinophils Absolute: 0.2 10*3/uL (ref 0.0–0.7)
Eosinophils Relative: 2 %
HEMATOCRIT: 39.1 % (ref 36.0–46.0)
HEMOGLOBIN: 13.1 g/dL (ref 12.0–15.0)
LYMPHS PCT: 32 %
Lymphs Abs: 2.4 10*3/uL (ref 0.7–4.0)
MCH: 27.5 pg (ref 26.0–34.0)
MCHC: 33.5 g/dL (ref 30.0–36.0)
MCV: 82 fL (ref 78.0–100.0)
MONO ABS: 0.7 10*3/uL (ref 0.1–1.0)
MONOS PCT: 9 %
NEUTROS ABS: 4.2 10*3/uL (ref 1.7–7.7)
Neutrophils Relative %: 56 %
Platelets: 244 10*3/uL (ref 150–400)
RBC: 4.77 MIL/uL (ref 3.87–5.11)
RDW: 13.4 % (ref 11.5–15.5)
WBC: 7.5 10*3/uL (ref 4.0–10.5)

## 2017-01-10 LAB — ECHOCARDIOGRAM COMPLETE
Height: 61 in
Weight: 2982.4 oz

## 2017-01-10 LAB — LIPID PANEL
Cholesterol: 171 mg/dL (ref 0–200)
HDL: 36 mg/dL — ABNORMAL LOW (ref >40)
LDL CALC: 83 mg/dL (ref 0–99)
Total CHOL/HDL Ratio: 4.8 RATIO
Triglycerides: 262 mg/dL — ABNORMAL HIGH (ref <150)
VLDL: 52 mg/dL — AB (ref 0–40)

## 2017-01-10 LAB — BASIC METABOLIC PANEL
ANION GAP: 8 (ref 5–15)
BUN: 9 mg/dL (ref 6–20)
CALCIUM: 8.8 mg/dL — AB (ref 8.9–10.3)
CHLORIDE: 107 mmol/L (ref 101–111)
CO2: 26 mmol/L (ref 22–32)
Creatinine, Ser: 0.85 mg/dL (ref 0.44–1.00)
GFR calc Af Amer: >60 mL/min (ref >60)
GFR calc non Af Amer: >60 mL/min (ref >60)
GLUCOSE: 117 mg/dL — AB (ref 65–99)
Potassium: 3.6 mmol/L (ref 3.5–5.1)
Sodium: 141 mmol/L (ref 135–145)

## 2017-01-10 LAB — HEMOGLOBIN A1C
Hgb A1c MFr Bld: 5.7 % — ABNORMAL HIGH (ref 4.8–5.6)
Mean Plasma Glucose: 116.89 mg/dL

## 2017-01-10 LAB — HIV ANTIBODY (ROUTINE TESTING W REFLEX): HIV SCREEN 4TH GENERATION: NONREACTIVE

## 2017-01-10 MED ORDER — ROSUVASTATIN CALCIUM 5 MG PO TABS: 20.0000 mg | ORAL_TABLET | Freq: Every evening | ORAL | Status: DC

## 2017-01-10 MED ORDER — ASPIRIN EC 325 MG PO TBEC
325.0000 mg | DELAYED_RELEASE_TABLET | Freq: Every day | ORAL | Status: DC
  Administered 2017-01-10: 325 mg via ORAL
  Filled 2017-01-10: qty 1

## 2017-01-10 MED ORDER — ASPIRIN 325 MG PO TBEC: 325.0000 mg | DELAYED_RELEASE_TABLET | Freq: Every day | ORAL | 0 refills | Status: DC

## 2017-01-10 MED ORDER — ROSUVASTATIN CALCIUM 20 MG PO TABS: 20.0000 mg | ORAL_TABLET | Freq: Every evening | ORAL | 0 refills | Status: DC

## 2017-01-10 NOTE — Evaluation (Signed)
Occupational Therapy Evaluation Patient Details Name: [name] MRN: [id number] DOB: 1956/04/23 Today's Date: 01/10/2017    History of Present Illness 61 y.o.right hand dominant femalewith medical history significant of HTN, HLD, remote history of tobacco abuse, and Gerd; who presented with complaints of right-sided weakness.    Clinical Impression   PTA, pt was living with her husband and was independent. Currently, pt performing near baseline function and benefits from increased time. All acute OT needs met. Recommend dc once medically stable per physician.     Follow Up Recommendations  No OT follow up;Supervision - Intermittent    Equipment Recommendations  None recommended by OT    Recommendations for Other Services       Precautions / Restrictions Precautions Precautions: None Restrictions Weight Bearing Restrictions: No      Mobility Bed Mobility               General bed mobility comments: In recliner upon arrival  Transfers Overall transfer level: Modified independent               General transfer comment: increased time    Balance Overall balance assessment: No apparent balance deficits (not formally assessed)                                         ADL either performed or assessed with clinical judgement   ADL Overall ADL's : Modified independent                                       General ADL Comments: Increased time for ADLs. Performed toileting and self care at sink without need for assistance.      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 With occularmotor assessment, pt has some jerky eye movements. 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      Perception     Praxis      Pertinent Vitals/Pain Pain Assessment: No/denies pain     Hand  Dominance Right   Extremity/Trunk Assessment Upper Extremity Assessment Upper Extremity Assessment: Overall WFL for tasks assessed (Pt presenting with equal strength and sensation in BUEs)   Lower Extremity Assessment Lower Extremity Assessment: Defer to PT evaluation   Cervical / Trunk Assessment Cervical / Trunk Assessment: Normal   Communication Communication Communication: No difficulties   Cognition Arousal/Alertness: Awake/alert Behavior During Therapy: WFL for tasks assessed/performed Overall Cognitive Status: Within Functional Limits for tasks assessed                                 General Comments: Some difficulty with money questions. Pt able to answer after writing question down.    General Comments  Husband and daughter present during evaluation    Exercises     Shoulder Instructions      Home Living Family/patient expects to be discharged to:: Private residence Living Arrangements: Spouse/significant other Available Help at Discharge: Friend(s);Available 24 hours/day Type of Home: House Home Access: Stairs to enter [employer] of Steps: 1-2         Bathroom Shower/Tub: Corporate investment banker: Standard  Prior Functioning/Environment Level of Independence: Independent        Comments: Performing ADLs, IADLs, driving, and works at [employer].         OT Problem List: Decreased strength;Impaired balance (sitting and/or standing);Decreased safety awareness;Decreased knowledge of use of DME or AE;Decreased knowledge of precautions      OT Treatment/Interventions:      OT Goals(Current goals can be found in the care plan section) Acute Rehab OT Goals Patient Stated Goal: Go home today OT Goal Formulation: With patient Time For Goal Achievement: 01/24/17 Potential to Achieve Goals: Good  OT Frequency:     Barriers to D/C:            Co-evaluation               AM-PAC PT "6 Clicks" Daily Activity     Outcome Measure Help from another person eating meals?: None Help from another person taking care of personal grooming?: None Help from another person toileting, which includes using toliet, bedpan, or urinal?: None Help from another person bathing (including washing, rinsing, drying)?: None Help from another person to put on and taking off regular upper body clothing?: None Help from another person to put on and taking off regular lower body clothing?: None 6 Click Score: 24   End of Session Equipment Utilized During Treatment: Gait belt Nurse Communication: Mobility status  Activity Tolerance: Patient tolerated treatment well Patient left: in chair;with call bell/phone within reach;with family/visitor present  OT Visit Diagnosis: Unsteadiness on feet (R26.81)                Time: 2297-9892 OT Time Calculation (min): 22 min Charges:  OT General Charges $OT Visit: 1 Visit OT Evaluation $OT Eval Low Complexity: 1 Low G-Codes: OT G-codes **NOT FOR INPATIENT CLASS** Functional Assessment Tool Used: Clinical judgement Functional Limitation: Self care Self Care Current Status (J1941): At least 1 percent but less than 20 percent impaired, limited or restricted Self Care Goal Status (D4081): 0 percent impaired, limited or restricted Self Care Discharge Status [phone]): At least 1 percent but less than 20 percent impaired, limited or restricted   [place], OTR/L Acute Rehab Pager: [phone] Office: [place] 01/10/2017, 11:19 AM

## 2017-01-10 NOTE — Progress Notes (Signed)
Patient requesting note from MD to return to work Friday 01/12/17. Dr. Eliseo Squires paged. RN preparing d/c summary for patient and will continue to monitor.

## 2017-01-10 NOTE — Progress Notes (Signed)
STROKE TEAM PROGRESS NOTE   HISTORY OF PRESENT ILLNESS (per record) [name] is a 61 y.o. female with a history of hypertension and hyperlipidemia  who presents with right arm and leg weakness and numbness that is improving. She states that started at 2 PM on 01/09/2018 while she was buying gasoline. When she was driving she noticed that her right foot was not working appropriately either. She therefore sought care in the emergency department. At the time of arrival here, she was improving but she still felt like her right arm and leg were heavier than typical.  LKW: 2 PM on 01/09/2017 NIHSS: 0  Patient was not administered IV t-PA secondary to presenting with minimal symptoms/arriving outside of the tPA treatment window. She was admitted to General Neurology for further evaluation and treatment.   SUBJECTIVE (INTERVAL HISTORY) Her son is at the bedside.  Pt is back to baseline. Admitted that her BP at home not in control. On crestor for HLD. Quit smoking 9 years ago. No alcohol. Denies OSA at home.    OBJECTIVE Temp:  [97.7 F (36.5 C)-98.3 F (36.8 C)] 97.7 F (36.5 C) (08/29 1100) Pulse Rate:  [64-88] 64 (08/29 1100) Cardiac Rhythm: Normal sinus rhythm (08/29 0700) Resp:  [16-22] 20 (08/29 1100) BP: (131-192)/(56-101) 148/76 (08/29 1100) SpO2:  [94 %-98 %] 97 % (08/29 1100) Weight:  [84.6 kg (186 lb 6.4 oz)-85.3 kg (188 lb)] 84.6 kg (186 lb 6.4 oz) (08/28 2301)  CBC:   Recent Labs Lab 01/09/17 1913 01/09/17 1927 01/10/17 0626  WBC 10.6*  --  7.5  NEUTROABS 6.6  --  4.2  HGB 14.1 14.3 13.1  HCT 42.0 42.0 39.1  MCV 81.6  --  82.0  PLT 257  --  917    Basic Metabolic Panel:   Recent Labs Lab 01/09/17 1913 01/09/17 1927 01/10/17 0626  NA 141 142 141  K 3.7 3.6 3.6  CL 109 107 107  CO2 21*  --  26  GLUCOSE 146* 149* 117*  BUN _0 CREATININE 0.93 0.80 0.85  CALCIUM 9.2  --  8.8*    Lipid Panel:     Component Value Date/Time   CHOL 171 01/10/2017  0626   TRIG 262 (H) 01/10/2017 0626   HDL 36 (L) 01/10/2017 0626   CHOLHDL 4.8 01/10/2017 0626   VLDL 52 (H) 01/10/2017 0626   LDLCALC 83 01/10/2017 0626   HgbA1c:  Lab Results  Component Value Date   HGBA1C 5.7 (H) 01/10/2017   Urine Drug Screen: No results found for: LABOPIA, COCAINSCRNUR, LABBENZ, AMPHETMU, THCU, LABBARB  Alcohol Level No results found for: [place] I have personally reviewed the radiological images below and agree with the radiology interpretations.  Mr Brain 68 Contrast Mr Virgel Paling Wo Contrast 01/09/2017 IMPRESSION: MRI HEAD: 1. No acute intracranial process. 2. Mild chronic small vessel ischemic disease. MRA HEAD: 1. No emergent large vessel occlusion or severe stenosis. 2. Moderate middle cerebral arteries and mild posterior cerebral arteries luminal regularity compatible atherosclerosis, less likely vasculopathy.  Ct Head Code Stroke Wo Contrast 01/09/2017 IMPRESSION: 1. Normal for age Normal noncontrast CT appearance of the brain. 2. ASPECTS is 10. 3. Multiple subcentimeter lucent lesions in the calvarium are indeterminate. Consider serum and urine protein electrophoresis correlation to exclude Multiple Myeloma.   TTE 01/10/2017 Study Conclusions - Left ventricle: The cavity size was normal. Wall thickness was normal. Systolic function was normal. The estimated ejection fraction was in the range  of 60% to 65%. Wall motion was normal; there were no regional wall motion abnormalities. Left ventricular diastolic function parameters were normal for the patient&'s age. - Pulmonary arteries: Systolic pressure was mildly increased. PA peak pressure: 32 mm Hg (S).  Carotid US 01/10/2017 Findings (preliminary): 1-39% internal carotid artery stenosis bilaterally. Vertebral arteries are patent with antegrade flow.    PHYSICAL EXAM  Temp:  [97.7 F (36.5 C)-98.3 F (36.8 C)] 97.7 F (36.5 C) (08/29 1100) Pulse Rate:  [64-88] 64 (08/29 1100) Resp:  [16-22]  20 (08/29 1100) BP: (131-192)/(56-101) 148/76 (08/29 1100) SpO2:  [94 %-98 %] 97 % (08/29 1100) Weight:  [186 lb 6.4 oz (84.6 kg)-188 lb (85.3 kg)] 186 lb 6.4 oz (84.6 kg) (08/28 2301)  General - Well nourished, well developed, in no apparent distress.  Ophthalmologic - Fundi not visualized due to small pupils.  Cardiovascular - Regular rate and rhythm.  Mental Status -  Level of arousal and orientation to time, place, and person were intact. Language including expression, naming, repetition, comprehension was assessed and found intact. Fund of Knowledge was assessed and was intact.  Cranial Nerves II - XII - II - Visual field intact OU. III, IV, VI - Extraocular movements intact. V - Facial sensation intact bilaterally. VII - Facial movement intact bilaterally. VIII - Hearing & vestibular intact bilaterally. X - Palate elevates symmetrically. XI - Chin turning & shoulder shrug intact bilaterally. XII - Tongue protrusion intact.  Motor Strength - The patient's strength was normal in all extremities and pronator drift was absent.  Bulk was normal and fasciculations were absent.   Motor Tone - Muscle tone was assessed at the neck and appendages and was normal.  Reflexes - The patient's reflexes were 1+ in all extremities and she had no pathological reflexes.  Sensory - Light touch, temperature/pinprick were assessed and were symmetrical.    Coordination - The patient had normal movements in the hands with no ataxia or dysmetria.  Tremor was absent.  Gait and Station - deferred.   ASSESSMENT/PLAN [name] is a 61 y.o. female with history of hypertension and hyperlipidemia  who presents with right arm and leg weakness and numbness that is improving. She did not receive IV t-PA due to presenting with minimal symptoms/arriving outside of the tPA treatment window.   TIA - risk factor including HTN, HLD, former smoker, right MCA stenosis. No sign of cervical spinal cord  compression.   Resultant  Back to baseline  CT head: no acute stroke  MRI head: no acute stroke.  Subcentimeter lucent lesions in the calvarium, unable to exclude multiple myeloma.  MRA head: no LVO or high-grade stenosis  Carotid Doppler: B ICA 1-39% stenosis, VAs antegrade  2D Echo: EF 60-65%. No source of embolus  LDL 83  HgbA1c 5.3  Lovenox 40 mg sq daily for VTE prophylaxis Diet Heart Room service appropriate? Yes; Fluid consistency: Thin  No antithrombotic prior to admission, now on aspirin 325 mg daily. Continue ASA 378m at discharge.  Patient counseled to be compliant with her antithrombotic medications  Ongoing aggressive stroke risk factor management  Therapy recommendations: none  Disposition: home  Need to rule out MM  CT head showed Multiple subcentimeter lucent lesions in the calvarium are indeterminate.   Recommend serum and urine protein electrophoresis to exclude Multiple Myeloma as outpt.  Asymptomatic   Hypertension  Stable  Permissive hypertension (OK if < 220/120) but gradually normalize in 5-7 days  Long-term BP goal normotensive  Pt needs new BP device at home and continue to monitor  Hyperlipidemia  Home meds: rosuvastatin 10 mg PO daily  LDL 83, goal < 70  Increase dose to rosuvastatin 20 mg PO daily  Continue statin at discharge  Other Stroke Risk Factors  Obesity, Body mass index is 35.22 kg/m., recommend weight loss, diet and exercise as appropriate   Other Active Problems  None  Hospital day # 0  Neurology will sign off. Please call with questions. Pt will follow up with Cecille Rubin, NP, at [facility] in about 6 weeks. Thanks for the consult.  Rosalin Hawking, MD PhD Stroke Neurology 01/10/2017 4:45 PM   To contact Stroke Continuity provider, please refer to [email or web address] After hours, contact General Neurology

## 2017-01-10 NOTE — Discharge Summary (Signed)
Physician Discharge Summary  [name]:[id number] DOB: 13-Jun-1955 DOA: 01/09/2017  PCP: Marda Stalker, PA-C  Admit date: 01/09/2017 Discharge date: 01/10/2017   Recommendations for Outpatient Follow-Up:   1. Neurology to follow outpatient 2. BP medication adjustments by PCP   Discharge Diagnosis:   Principal Problem:   Right sided weakness Active Problems:   HLD (hyperlipidemia)   Essential hypertension, benign   GERD   Leukocytosis   Discharge disposition:  Home.  SNF:  Discharge Condition: Improved.  Diet recommendation: Low sodium, heart healthy.  Carbohydrate-modified.  Regular.  Wound care: None.   History of Present Illness:   [name] is a 62 y.o. right hand dominant female with medical history significant of HTN, HLD, remote history of tobacco abuse, and [name]; who presented with complaints of right-sided weakness. 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 She first noticed a tingling/numbness in her right fingers andthat she was having difficulty signing the papers. Thereafter, she went to pump gas and while getting out of a car noted that she was dragging her right foot. Denies any slurred speech, changes in vision, chest pain, nausea, vomiting, diarrhea, or dysuria symptoms. Associated symptoms included palpitations and some right arm pain. Patient reports previously being diagnosed by Dr. Vicente Males cardiology with atrial fibrillation, but review of records notes that she was evaluated by Dr. Saunders Revel for palpitations and never formally diagnosed with A. Fib it seems.   Hospital Course by Problem:   Right sided weakness due to TIA: Acute. -resolved -MRI negative -echo/carotid -full dose ASA -increased crestor for LDL not <70 Hgba1C: 5.7  Leukocytosis:  -resolved  Lucent lesions in the calvarium:  Incidental finding on CT scan of the brain. No clear indications at this time to suggest multiple  myeloma. - Continue to monitor  Essential hypertension:  -well controlled here -PCP to adjust BP meds  Hyperlipidemia - Continue Crestor at a higher dose  GERD - PPI    Medical Consultants:    Neuro.   Discharge Exam:   Vitals:   01/10/17 0901 01/10/17 1100  BP: (!) 146/68 (!) 148/76  Pulse: 78 64  Resp: 18 20  Temp: 98 F (36.7 C) 97.7 F (36.5 C)  SpO2: 98% 97%   Vitals:   01/10/17 0500 01/10/17 0700 01/10/17 0901 01/10/17 1100  BP: (!) 135/56 135/76 (!) 146/68 (!) 148/76  Pulse: 65 88 78 64  Resp: '18 20 18 20  ' Temp: 98.2 F (36.8 C) 97.9 F (36.6 C) 98 F (36.7 C) 97.7 F (36.5 C)  TempSrc: Oral Oral Oral Oral  SpO2: 98% 98% 98% 97%  Weight:      Height:        Gen:  NAD    The results of significant diagnostics from this hospitalization (including imaging, microbiology, ancillary and laboratory) are listed below for reference.     Procedures and Diagnostic Studies:   Mr Brain Wo Contrast  Result Date: 01/09/2017 CLINICAL DATA:  RIGHT arm and leg weakness and numbness, beginning at 2 p.m. while driving. Symptoms now improving. Assess transient ischemic attack. History of hypertension and hypercholesterolemia. EXAM: MRI HEAD WITHOUT CONTRAST MRA HEAD WITHOUT CONTRAST TECHNIQUE: Multiplanar, multiecho pulse sequences of the brain and surrounding structures were obtained without intravenous contrast. Angiographic images of the head were obtained using MRA technique without contrast. COMPARISON:  CT HEAD January 09, 2017 at 1858 hours FINDINGS: MRI HEAD FINDINGS BRAIN: No reduced diffusion to suggest acute ischemia.  No susceptibility artifact to suggest hemorrhage. The ventricles and sulci are normal for patient's age. Scattered subcentimeter supratentorial white matter FLAIR T2 hyperintensities. No suspicious parenchymal signal, mass or mass effect. No abnormal extra-axial fluid collections. VASCULAR: Normal major intracranial vascular flow voids present  at skull base. SKULL AND UPPER CERVICAL SPINE: No abnormal sellar expansion. No suspicious calvarial bone marrow signal. Craniocervical junction maintained. SINUSES/ORBITS: Paranasal sinuses are well-aerated. Mastoid air cells are well aerated. The included ocular globes and orbital contents are non-suspicious. OTHER: None. MRA HEAD FINDINGS ANTERIOR CIRCULATION: Normal flow related enhancement of the included cervical, petrous, cavernous and supraclinoid internal carotid arteries. Patent anterior communicating artery. Patent anterior and middle cerebral arteries, including distal segments. Moderate luminal irregularity RIGHT greater than LEFT mid and distal middle cerebral artery's. No large vessel occlusion, high-grade stenosis, aneurysm. POSTERIOR CIRCULATION: Codominant vertebral arteries. Basilar artery is patent, with normal flow related enhancement of the main branch vessels. Patent posterior cerebral arteries, mild luminal irregularity. Robust RIGHT posterior communicating artery present. No large vessel occlusion, high-grade stenosis,  aneurysm. ANATOMIC VARIANTS: None. Source images and MIP images were reviewed. IMPRESSION: MRI HEAD: 1. No acute intracranial process. 2. Mild chronic small vessel ischemic disease. MRA HEAD: 1. No emergent large vessel occlusion or severe stenosis. 2. Moderate middle cerebral arteries and mild posterior cerebral arteries luminal regularity compatible atherosclerosis, less likely vasculopathy. Electronically Signed   By: Elon Alas M.D.   On: 01/09/2017 22:52   Mr [name] Head Wo Contrast  Result Date: 01/09/2017 CLINICAL DATA:  RIGHT arm and leg weakness and numbness, beginning at 2 p.m. while driving. Symptoms now improving. Assess transient ischemic attack. History of hypertension and hypercholesterolemia. EXAM: MRI HEAD WITHOUT CONTRAST MRA HEAD WITHOUT CONTRAST TECHNIQUE: Multiplanar, multiecho pulse sequences of the brain and surrounding structures were obtained  without intravenous contrast. Angiographic images of the head were obtained using MRA technique without contrast. COMPARISON:  CT HEAD January 09, 2017 at 1858 hours FINDINGS: MRI HEAD FINDINGS BRAIN: No reduced diffusion to suggest acute ischemia. No susceptibility artifact to suggest hemorrhage. The ventricles and sulci are normal for patient's age. Scattered subcentimeter supratentorial white matter FLAIR T2 hyperintensities. No suspicious parenchymal signal, mass or mass effect. No abnormal extra-axial fluid collections. VASCULAR: Normal major intracranial vascular flow voids present at skull base. SKULL AND UPPER CERVICAL SPINE: No abnormal sellar expansion. No suspicious calvarial bone marrow signal. Craniocervical junction maintained. SINUSES/ORBITS: Paranasal sinuses are well-aerated. Mastoid air cells are well aerated. The included ocular globes and orbital contents are non-suspicious. OTHER: None. MRA HEAD FINDINGS ANTERIOR CIRCULATION: Normal flow related enhancement of the included cervical, petrous, cavernous and supraclinoid internal carotid arteries. Patent anterior communicating artery. Patent anterior and middle cerebral arteries, including distal segments. Moderate luminal irregularity RIGHT greater than LEFT mid and distal middle cerebral artery's. No large vessel occlusion, high-grade stenosis, aneurysm. POSTERIOR CIRCULATION: Codominant vertebral arteries. Basilar artery is patent, with normal flow related enhancement of the main branch vessels. Patent posterior cerebral arteries, mild luminal irregularity. Robust RIGHT posterior communicating artery present. No large vessel occlusion, high-grade stenosis,  aneurysm. ANATOMIC VARIANTS: None. Source images and MIP images were reviewed. IMPRESSION: MRI HEAD: 1. No acute intracranial process. 2. Mild chronic small vessel ischemic disease. MRA HEAD: 1. No emergent large vessel occlusion or severe stenosis. 2. Moderate middle cerebral arteries and  mild posterior cerebral arteries luminal regularity compatible atherosclerosis, less likely vasculopathy. Electronically Signed   By: Elon Alas M.D.   On: 01/09/2017 22:52   Ct Head Code  Stroke Wo Contrast  Result Date: 01/09/2017 CLINICAL DATA:  Code stroke. 61 year old female with right arm weakness. Last seen normal 1044 hours. EXAM: CT HEAD WITHOUT CONTRAST TECHNIQUE: Contiguous axial images were obtained from the base of the skull through the vertex without intravenous contrast. COMPARISON:  None. FINDINGS: Brain: Cerebral volume is within normal limits for age. No midline shift, ventriculomegaly, mass effect, evidence of mass lesion, intracranial hemorrhage or evidence of cortically based acute infarction. Gray-white matter differentiation is within normal limits throughout the brain. Vascular: Calcified atherosclerosis at the skull base. No suspicious intracranial vascular hyperdensity. Skull: Osteopenia. Scattered small indeterminate lucent lesions in the skull (e.g. 7 mm right frontal bone series 4, image 43). No definitely destructive bone lesions. Sinuses/Orbits: Clear. Other: Disc conjugate gaze, otherwise negative orbits soft tissues. Visualized scalp soft tissues are within normal limits. ASPECTS [facility] Stroke Program Early CT Score) - Ganglionic level infarction (caudate, lentiform nuclei, internal capsule, insula, M1-M3 cortex): 7 - Supraganglionic infarction (M4-M6 cortex): 3 Total score (0-10 with 10 being normal): 10 IMPRESSION: 1. Normal for age Normal noncontrast CT appearance of the brain. 2. ASPECTS is 10. 3. Multiple subcentimeter lucent lesions in the calvarium are indeterminate. Consider serum and urine protein electrophoresis correlation to exclude Multiple Myeloma. 4. Salient findings were relayed via text pager to Dr. Roland Rack on 01/09/2017 at 1903 hours. Electronically Signed   By: Genevie Ann M.D.   On: 01/09/2017 19:05     Labs:   Basic Metabolic  Panel:  Recent Labs Lab 01/09/17 1913 01/09/17 1927 01/10/17 0626  NA 141 142 141  K 3.7 3.6 3.6  CL 109 107 107  CO2 21*  --  26  GLUCOSE 146* 149* 117*  BUN '9 9 9  ' CREATININE 0.93 0.80 0.85  CALCIUM 9.2  --  8.8*   GFR Estimated Creatinine Clearance: 67.7 mL/min (by C-G formula based on SCr of 0.85 mg/dL). Liver Function Tests:  Recent Labs Lab 01/09/17 1913  AST 26  ALT 23  ALKPHOS 173*  BILITOT 0.5  PROT 7.1  ALBUMIN 3.8   No results for input(s): LIPASE, AMYLASE in the last 168 hours. No results for input(s): AMMONIA in the last 168 hours. Coagulation profile  Recent Labs Lab 01/09/17 1913  INR 0.93    CBC:  Recent Labs Lab 01/09/17 1913 01/09/17 1927 01/10/17 0626  WBC 10.6*  --  7.5  NEUTROABS 6.6  --  4.2  HGB 14.1 14.3 13.1  HCT 42.0 42.0 39.1  MCV 81.6  --  82.0  PLT 257  --  244   Cardiac Enzymes: No results for input(s): CKTOTAL, CKMB, CKMBINDEX, TROPONINI in the last 168 hours. BNP: Invalid input(s): POCBNP CBG: No results for input(s): GLUCAP in the last 168 hours. D-Dimer No results for input(s): DDIMER in the last 72 hours. Hgb A1c  Recent Labs  01/10/17 0626  HGBA1C 5.7*   Lipid Profile  Recent Labs  01/10/17 0626  CHOL 171  HDL 36*  LDLCALC 83  TRIG 262*  CHOLHDL 4.8   Thyroid function studies No results for input(s): TSH, T4TOTAL, T3FREE, THYROIDAB in the last 72 hours.  Invalid input(s): FREET3 Anemia work up No results for input(s): VITAMINB12, FOLATE, FERRITIN, TIBC, IRON, RETICCTPCT in the last 72 hours. Microbiology No results found for this or any previous visit (from the past 240 hour(s)).   Discharge Instructions:   Discharge Instructions    Ambulatory referral to Neurology    Complete by:  As directed  An appointment is requested in approximately: 6 weeks   Diet - low sodium heart healthy    Complete by:  As directed    Increase activity slowly    Complete by:  As directed       Allergies as of 01/10/2017      Reactions   Codeine Nausea And Vomiting      Medication List    TAKE these medications   alendronate 70 MG tablet Commonly known as:  FOSAMAX Take 70 mg by mouth every 'Sunday. Take with a full glass of water on an empty stomach.   aspirin 325 MG EC tablet Take 1 tablet (325 mg total) by mouth daily.   celecoxib 200 MG capsule Commonly known as:  CELEBREX Take 200 mg by mouth daily as needed for mild pain.   DEXILANT 60 MG capsule Generic drug:  dexlansoprazole Take 60 mg by mouth daily.   fluticasone 50 MCG/ACT nasal spray Commonly known as:  FLONASE Place 2 sprays into both nostrils daily.   losartan 50 MG tablet Commonly known as:  COZAAR Take 100 mg by mouth every evening.   metoprolol tartrate 25 MG tablet Commonly known as:  LOPRESSOR Take 0.5 tablets (12.5 mg total) by mouth 2 (two) times daily. What changed:  how much to take  when to take this  additional instructions   rosuvastatin 20 MG tablet Commonly known as:  CRESTOR Take 1 tablet (20 mg total) by mouth every evening. What changed:  medication strength  how much to take   triazolam 0.25 MG tablet Commonly known as:  HALCION Take 0.25 mg by mouth at bedtime.            Discharge Care Instructions        Start     Ordered   01/11/17 0000  aspirin EC 325 MG EC tablet  Daily     01/10/17 1621   01/10/17 0000  rosuvastatin (CRESTOR) 20 MG tablet  Every evening     01/10/17 1621   01/10/17 0000  Increase activity slowly     01/10/17 1621   01/10/17 0000  Diet - low sodium heart healthy     08' /29/18 1621   01/10/17 0000  Ambulatory referral to Neurology    Comments:  An appointment is requested in approximately: 6 weeks   01/10/17 1621     Follow-up Information    Marda Stalker, PA-C Follow up in 1 week(s).   Specialty:  Family Medicine Why:  check BP and adjust medications as needed Contact information: [address]  [id number] [phone]        referral made for outpatient neurology follow up Follow up.            Time coordinating discharge: 35 min  Signed:  JESSICA U VANN   [facility] 01/10/2017, 4:22 PM

## 2017-01-10 NOTE — Care Management Note (Signed)
Case Management Note  Patient Details  Name: [name] MRN: [id number] Date of Birth: 1955-07-29  Subjective/Objective:                    Action/Plan: Pt discharging home with self care. Pt has PCP, insurance and transportation home. No further needs per CM.   Expected Discharge Date:  01/10/17               Expected Discharge Plan:  Home/Self Care  In-House Referral:     Discharge planning Services     Post Acute Care Choice:    Choice offered to:     DME Arranged:    DME Agency:     HH Arranged:    HH Agency:     Status of Service:  Completed, signed off  If discussed at [employer] of Stay Meetings, dates discussed:    Additional Comments:  Pollie Friar, RN 01/10/2017, 4:27 PM

## 2017-01-10 NOTE — Progress Notes (Signed)
PT Cancellation Note/Discharge  Patient Details Name: [name] MRN: [id number] DOB: 1955/08/09   Cancelled Treatment:    Reason Eval/Treat Not Completed: OT screened, no needs identified, will sign off.  OT screened for PT needs no needs identified.  PT to sign off.  See OT notes for details.  Thanks,    Barbarann Ehlers. [place], [place], DPT [phone]   01/10/2017, 11:50 AM

## 2017-01-10 NOTE — Progress Notes (Signed)
  Echocardiogram 2D Echocardiogram has been performed.  [name] 01/10/2017, 11:09 AM

## 2017-01-10 NOTE — Progress Notes (Signed)
TIA work up in progress-- await final recommendations from neuro Increased statin as still not at goal. [facility] for d/c later today  Eulogio Bear DO

## 2017-01-10 NOTE — Progress Notes (Signed)
*  PRELIMINARY RESULTS* Vascular Ultrasound Carotid Duplex (Doppler) has been completed.  Findings suggest 1-39% internal carotid artery stenosis bilaterally. Vertebral arteries are patent with antegrade flow.  01/10/2017 11:03 AM Maudry Mayhew, BS, RVT, RDCS, RDMS

## 2017-01-10 NOTE — Progress Notes (Signed)
SLP Cancellation Note  Patient Details Name: [name] MRN: [id number] DOB: Aug 25, 1955   Cancelled treatment:       Reason Eval/Treat Not Completed: SLP screened, no needs identified, will sign off   Dashonna Chagnon, Katherene Ponto 01/10/2017, 11:15 AM

## 2017-01-17 DIAGNOSIS — R531 Weakness: Secondary | ICD-10-CM | POA: Diagnosis not present

## 2017-01-17 DIAGNOSIS — Z23 Encounter for immunization: Secondary | ICD-10-CM | POA: Diagnosis not present

## 2017-01-17 DIAGNOSIS — F419 Anxiety disorder, unspecified: Secondary | ICD-10-CM | POA: Diagnosis not present

## 2017-01-17 DIAGNOSIS — E7800 Pure hypercholesterolemia, unspecified: Secondary | ICD-10-CM | POA: Diagnosis not present

## 2017-01-17 DIAGNOSIS — I10 Essential (primary) hypertension: Secondary | ICD-10-CM | POA: Diagnosis not present

## 2017-02-16 ENCOUNTER — Ambulatory Visit: Payer: BLUE CROSS/BLUE SHIELD | Admitting: Internal Medicine

## 2017-03-02 ENCOUNTER — Encounter: Payer: Self-pay | Admitting: Internal Medicine

## 2017-03-02 ENCOUNTER — Ambulatory Visit (INDEPENDENT_AMBULATORY_CARE_PROVIDER_SITE_OTHER): Payer: BLUE CROSS/BLUE SHIELD | Admitting: Internal Medicine

## 2017-03-02 VITALS — BP 144/80 | HR 81 | Resp 16 | Ht 61.0 in | Wt 190.0 lb

## 2017-03-02 DIAGNOSIS — G459 Transient cerebral ischemic attack, unspecified: Secondary | ICD-10-CM | POA: Diagnosis not present

## 2017-03-02 DIAGNOSIS — R600 Localized edema: Secondary | ICD-10-CM | POA: Diagnosis not present

## 2017-03-02 DIAGNOSIS — I10 Essential (primary) hypertension: Secondary | ICD-10-CM

## 2017-03-02 NOTE — Patient Instructions (Signed)
Medication Instructions:  Your physician recommends that you continue on your current medications as directed. Please refer to the Current Medication list given to you today.   Labwork: None   Testing/Procedures: None   Follow-Up: Your physician wants you to follow-up in: 6 months with Dr End. (April 2019).  You will receive a reminder letter in the mail two months in advance. If you don't receive a letter, please call our office to schedule the follow-up appointment.   Any Other Special Instructions Will Be Listed Below (If Applicable).  Use compression stockings to help the swelling in your feet and legs. Put them on in the morning and take them off at night.   If you need a refill on your cardiac medications before your next appointment, please call your pharmacy.

## 2017-03-02 NOTE — Progress Notes (Signed)
Follow-up Outpatient Visit Date: 03/02/2017  Primary Care Provider: Marda Stalker, PA-C [place] [address]  Chief Complaint: Leg swelling  HPI:  [name] is a 61 y.o. year-old female with history of hypertension, hyperlipidemia, TIA, and family history of premature coronary artery disease, who presents for follow-up of leg swelling.  I last saw her in 04/2016, at which time [name] was doing well.  Her biggest complaint was of swelling in her legs, most likely due to venous insufficiency.  Subsequent echo showed no significant abnormalities.  [name] was given a prescription for compression stockings but misplaced the prescription before having it filled.  She notes continued rash on both shins, for which she has been using an antibiotic ointment.  [name] was hospitalized at [facility] in late August with right-sided numbness and weakness.  Extensive neurologic evaluation was unrevealing, leading to the diagnosis of a TIA.  She has not had any recurrent symptoms since then.  She denies any further episodes of chest pain, shortness of breath, palpitations, and orthopnea, too.  --------------------------------------------------------------------------------------------------  Cardiovascular History & Procedures: Cardiovascular Problems:  Atypical chest pain  Palpitations  Leg pain and edema  Risk Factors:  Hypertension, hyperlipidemia, and family history of premature cardiovascular disease  Cath/PCI:  None  CV Surgery:  None  EP Procedures and Devices:  30 day event monitor (03/09/16): Predominant rhythm was sinus with heart rate from 50-100 bpm. No sustained arrhythmias or prolonged pauses were identified. Occasional isolated PACs and PVCs were noted.  Non-Invasive Evaluation(s):  Carotid Doppler (01/10/17): Mild (1-39%) internal carotid artery stenosis bilaterally.  Patent vertebral arteries with antegrade flow.  TTE (01/10/17):  Normal LV size and wall thickness.  LVEF 60-65% with normal diastolic function.  Normal RV size and function.  No significant valvular abnormalities per  TTE (09/28/16): Normal LV size with mild LVH.  LVEF 55-60% with normal diastolic function.  Mild left atrial enlargement.  Normal RV size and function.  No significant valvular abnormalities.  Pharmacologic myocardial perfusion stress test (01/09/16): Low risk study without evidence of ischemia or infarction. LVEF 69%.  Lower extremity ABIs (09/01/14): Normal (1.1 bilaterally).  Recent CV Pertinent Labs: Lab Results  Component Value Date   CHOL 171 01/10/2017   HDL 36 (L) 01/10/2017   LDLCALC 83 01/10/2017   TRIG 262 (H) 01/10/2017   CHOLHDL 4.8 01/10/2017   INR 0.93 01/09/2017   K 3.6 01/10/2017   MG 2.0 04/20/2016   BUN 9 01/10/2017   CREATININE 0.85 01/10/2017   CREATININE 0.79 04/20/2016    Past medical and surgical history were reviewed and updated in EPIC.  Current Meds  Medication Sig  . alendronate (FOSAMAX) 70 MG tablet Take 70 mg by mouth every Sunday. Take with a full glass of water on an empty stomach.  Marland Kitchen aspirin EC 325 MG EC tablet Take 1 tablet (325 mg total) by mouth daily.  . celecoxib (CELEBREX) 200 MG capsule Take 200 mg by mouth daily as needed for mild pain.  Marland Kitchen dexlansoprazole (DEXILANT) 60 MG capsule Take 60 mg by mouth daily.  . fluticasone (FLONASE) 50 MCG/ACT nasal spray Place 2 sprays into both nostrils daily.  Marland Kitchen losartan (COZAAR) 50 MG tablet Take 100 mg by mouth every evening.  . metoprolol tartrate (LOPRESSOR) 25 MG tablet Take 0.5 tablets (12.5 mg total) by mouth 2 (two) times daily. (Patient taking differently: Take 12.5-25 mg by mouth See admin instructions. 25 mg in the morning 12.5 mg in  the evening)  . rosuvastatin (CRESTOR) 20 MG tablet Take 1 tablet (20 mg total) by mouth every evening.  . triazolam (HALCION) 0.25 MG tablet Take 0.25 mg by mouth at bedtime.    Allergies: Codeine  Social  History   Social History  . Marital status: Married    Spouse name: N/A  . Number of children: N/A  . Years of education: N/A   Occupational History  . UNEMPLOYED    Social History Main Topics  . Smoking status: Former Smoker    Packs/day: 2.50    Years: 35.00    Types: Cigarettes    Quit date: 12/24/2008  . Smokeless tobacco: Never Used  . Alcohol use No  . Drug use: No  . Sexual activity: Not on file   Other Topics Concern  . Not on file   Social History Narrative  . No narrative on file    Family History  Problem Relation Age of Onset  . Microcephaly Father   . CAD Father   . Heart disease Sister        atrial fibrillation  . Heart attack Brother        died 70  . Colon polyps Mother   . Hypertension Mother   . Colon cancer Neg Hx   . Liver disease Neg Hx     Review of Systems: A 12-system review of systems was performed and was negative except as noted in the HPI.  --------------------------------------------------------------------------------------------------  Physical Exam: BP (!) 144/80   Pulse 81   Resp 16   Ht 5\' 1"  (1.549 m)   Wt 190 lb (86.2 kg)   SpO2 98%   BMI 35.90 kg/m    General: Obese woman, seated comfortably in the exam room. HEENT: No conjunctival pallor or scleral icterus. Moist mucous membranes.  OP clear. Neck: Supple without lymphadenopathy, thyromegaly, JVD, or HJR. No carotid bruit. Lungs: Normal work of breathing. Clear to auscultation bilaterally without wheezes or crackles. Heart: Regular rate and rhythm without murmurs, rubs, or gallops.  Unable to assess PMI due to body habitus. Abd: Bowel sounds present. Soft, NT/ND.  Unable to assess HSM due to body habitus. Ext: Trace ankle and calf edema bilaterally. Radial, PT, and DP pulses are 2+ bilaterally. Skin: Warm and dry.  Erythematous plaques noted on both calves with areas of excoriation.  EKG: Normal sinus rhythm with poor R wave progression (question lead placement).   Otherwise, no significant abnormalities.  Lab Results  Component Value Date   WBC 7.5 01/10/2017   HGB 13.1 01/10/2017   HCT 39.1 01/10/2017   MCV 82.0 01/10/2017   PLT 244 01/10/2017    Lab Results  Component Value Date   NA 141 01/10/2017   K 3.6 01/10/2017   CL 107 01/10/2017   CO2 26 01/10/2017   BUN 9 01/10/2017   CREATININE 0.85 01/10/2017   GLUCOSE 117 (H) 01/10/2017   ALT 23 01/09/2017    Lab Results  Component Value Date   CHOL 171 01/10/2017   HDL 36 (L) 01/10/2017   LDLCALC 83 01/10/2017   TRIG 262 (H) 01/10/2017   CHOLHDL 4.8 01/10/2017    --------------------------------------------------------------------------------------------------  ASSESSMENT AND PLAN: Bilateral lower extremity edema and rash I suspect [name]'s symptoms are related to chronic venous insufficiency leading to stasis dermatitis.  I provided her with a new prescription for compression stockings.  It is reasonable for her to follow-up with dermatology for further assessment of the rash.  If significant edema persists  despite using compression stockings, she may benefit from a referral to a vein specialist.  ABIs 2 years ago were normal.  TIA Symptoms have completely resolved without recurrence.  Workup in the hospital was unrevealing.  It is reasonable to continue with secondary prevention, including aspirin and rosuvastatin.  Hypertension Blood pressure mildly elevated today.  I will defer further management to [name]'s PCP.  Follow-up: Return to clinic in 6 months.  Nelva Bush, MD 03/03/2017 2:51 PM

## 2017-03-03 DIAGNOSIS — G459 Transient cerebral ischemic attack, unspecified: Secondary | ICD-10-CM | POA: Insufficient documentation

## 2017-03-06 NOTE — Progress Notes (Signed)
[facility]  PATIENT: [name] DOB: 1955-08-06   REASON FOR VISIT: Hospital follow-up for TIA HISTORY FROM: patient     HISTORY OF PRESENT ILLNESS:HISTORY OF PRESENT ILLNESS (per record) [name] a 61 y.o.femalewith a history of hypertension and hyperlipidemia  who presents with right arm and leg weakness and numbness that is improving. She states that started at 2 PM on 01/09/2018 while she was buying gasoline. When she was driving she noticed that her right foot was not working appropriately either. She therefore sought care in the emergency department. At the time of arrival here, she was improving but she still felt like her right arm and leg were heavier than typical. LKW: 2 PM on 01/09/2017 NIHSS: 0 Patient was not administered IV t-PA secondary to presenting with minimal symptoms/arriving outside of the tPA treatment window. She was admitted to General Neurology for further evaluation and treatment. SUBJECTIVE (INTERVAL HISTORY) Her son is at the bedside.  Pt is back to baseline. Admitted that her BP at home not in control. On crestor for HLD. Quit smoking 9 years ago. No alcohol. Denies OSA at home.  Interval history 10/24/2018CM [name], 61 year old female returns for hospital follow-up after admission for right arm and leg weakness and numbness which has resolved. MRI of the head no acute stroke. MRA no large vessel occlusion or high-grade stenosis. Carotid Doppler 139% stenosis. 2-D echo 60-65%. LDL 83 hemoglobin A1c 5.3. She is currently on aspirin for secondary stroke prevention with minimal bruising and bleeding. She has not had further stroke or TIA symptoms .Blood pressure in the office today 157/81. She had recent changes to her blood pressure medication. She remains on Crestor without myalgias. She does not exercise. She denies any difficulty with sleeping. She returns for reevaluation  REVIEW OF SYSTEMS: Full 14 system review of systems  performed and notable only for those listed, all others are neg:  Constitution fatigue  Cardiovascular: neg Ear/Nose/Throat: neg  Skin: neg Eyes: neg Respiratory: neg Gastroitestinal: neg  Hematology/Lymphatic: neg  Endocrine: neg Musculoskeletal:neg Allergy/Immunology: neg Neurological: neg Psychiatric: neg Sleep : neg   ALLERGIES: Allergies  Allergen Reactions  . Codeine Nausea And Vomiting    HOME MEDICATIONS: Outpatient Medications Prior to Visit  Medication Sig Dispense Refill  . alendronate (FOSAMAX) 70 MG tablet Take 70 mg by mouth every Sunday. Take with a full glass of water on an empty stomach.    Marland Kitchen aspirin EC 325 MG EC tablet Take 1 tablet (325 mg total) by mouth daily. 30 tablet 0  . celecoxib (CELEBREX) 200 MG capsule Take 200 mg by mouth daily as needed for mild pain.    Marland Kitchen dexlansoprazole (DEXILANT) 60 MG capsule Take 60 mg by mouth daily.    . fluticasone (FLONASE) 50 MCG/ACT nasal spray Place 2 sprays into both nostrils daily.    Marland Kitchen losartan (COZAAR) 50 MG tablet Take 100 mg by mouth every evening.    . metoprolol tartrate (LOPRESSOR) 25 MG tablet Take 0.5 tablets (12.5 mg total) by mouth 2 (two) times daily. (Patient taking differently: Take 12.5-25 mg by mouth See admin instructions. 25 mg in the morning 12.5 mg in the evening)    . rosuvastatin (CRESTOR) 20 MG tablet Take 1 tablet (20 mg total) by mouth every evening. 30 tablet 0  . triazolam (HALCION) 0.25 MG tablet Take 0.25 mg by mouth at bedtime.     No facility-administered medications prior to visit.     PAST MEDICAL HISTORY:  Past Medical History:  Diagnosis Date  . Chest discomfort   . GERD (gastroesophageal reflux disease)   . High cholesterol   . Hypertension   . Joint pain   . 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 Osteoporosis    T SCORE OF -2.7 ON DEXA IN 2013    PAST SURGICAL HISTORY: Past Surgical History:  Procedure Laterality Date  . APPENDECTOMY    . CHOLECYSTECTOMY       FAMILY HISTORY: Family History  Problem Relation Age of Onset  . Microcephaly Father   . CAD Father   . Heart disease Sister        atrial fibrillation  . Heart attack Brother        died 107  . Colon polyps Mother   . Hypertension Mother   . Colon cancer Neg Hx   . Liver disease Neg Hx     SOCIAL HISTORY: Social History   Social History  . Marital status: Married    Spouse name: N/A  . Number of children: N/A  . Years of education: N/A   Occupational History  . UNEMPLOYED    Social History Main Topics  . Smoking status: Former Smoker    Packs/day: 2.50    Years: 35.00    Types: Cigarettes    Quit date: 12/24/2008  . Smokeless tobacco: Never Used  . Alcohol use No  . Drug use: No  . Sexual activity: Not on file   Other Topics Concern  . Not on file   Social History Narrative  . No narrative on file     PHYSICAL EXAM  Vitals:   03/07/17 0723  BP: (!) 157/81  Pulse: 62  Weight: 190 lb (86.2 kg)  Height: 5\' 1"  (1.549 m)   Body mass index is 35.9 kg/m.  Generalized: Well developed, obese female in no acute distress  Head: normocephalic and atraumatic,. Oropharynx benign  Neck: Supple, no carotid bruits  Cardiac: Regular rate rhythm, no murmur  Musculoskeletal: No deformity   Neurological examination   Mentation: Alert oriented to time, place, history taking. Attention span and concentration appropriate. Recent and remote memory intact.  Follows all commands speech and language fluent.   Cranial nerve II-XII: Pupils were equal round reactive to light extraocular movements were full, visual field were full on confrontational test. Facial sensation and strength were normal. hearing was intact to finger rubbing bilaterally. Uvula tongue midline. head turning and shoulder shrug were normal and symmetric.Tongue protrusion into cheek strength was normal. Motor: normal bulk and tone, full strength in the BUE, BLE, fine finger movements normal, no pronator  drift. No focal weakness Sensory: normal and symmetric to light touch, pinprick, and  Vibration, in the upper and lower extremities Coordination: finger-nose-finger, heel-to-shin bilaterally, no dysmetria, no tremor Reflexes: 1+ upper lower and symmetric, plantar responses were flexor bilaterally. Gait and Station: Rising up from seated position without assistance, normal stance,  moderate stride, good arm swing, smooth turning, able to perform tiptoe, and heel walking without difficulty. Tandem gait is steady  DIAGNOSTIC DATA (LABS, IMAGING, TESTING) - I reviewed patient records, labs, notes, testing and imaging myself where available.  Lab Results  Component Value Date   WBC 7.5 01/10/2017   HGB 13.1 01/10/2017   HCT 39.1 01/10/2017   MCV 82.0 01/10/2017   PLT 244 01/10/2017      Component Value Date/Time   NA 141 01/10/2017 0626   K 3.6 01/10/2017 0626   CL 107 01/10/2017  0626   CO2 26 01/10/2017 0626   GLUCOSE 117 (H) 01/10/2017 0626   BUN 9 01/10/2017 0626   CREATININE 0.85 01/10/2017 0626   CREATININE 0.79 04/20/2016 1659   CALCIUM 8.8 (L) 01/10/2017 0626   PROT 7.1 01/09/2017 1913   ALBUMIN 3.8 01/09/2017 1913   AST 26 01/09/2017 1913   ALT 23 01/09/2017 1913   ALKPHOS 173 (H) 01/09/2017 1913   BILITOT 0.5 01/09/2017 1913   GFRNONAA >60 01/10/2017 0626   GFRAA >60 01/10/2017 0626   Lab Results  Component Value Date   CHOL 171 01/10/2017   HDL 36 (L) 01/10/2017   LDLCALC 83 01/10/2017   TRIG 262 (H) 01/10/2017   CHOLHDL 4.8 01/10/2017   Lab Results  Component Value Date   HGBA1C 5.7 (H) 01/10/2017   No results found for: [id number] Lab Results  Component Value Date   TSH 1.63 04/20/2016      ASSESSMENT AND PLAN Ms. JENELLA CRAIGIE is a 61 y.o. female with history of hypertension and hyperlipidemia  who presents with right arm and leg weakness and numbness that is improving. She did not receive IV t-PA due to presenting with minimal symptoms/arriving  outside of the tPA treatment window. She is here for hospital follow-up for TIA.MRI of the head no acute stroke. MRA no large vessel occlusion or high-grade stenosis. Carotid Doppler 139% stenosis. 2-D echo 60-65%. LDL 83 hemoglobin A1c 5.3. The patient is a current patient of Dr. Erlinda Hong  who is out of the office today . This note is sent to the work in doctor.     PLAN:Stressed the importance of management of risk factors to prevent further stroke Continue aspirin for secondary stroke prevention Maintain strict control of hypertension with blood pressure goal below 130/90, today's reading 157/81 continue antihypertensive medications Control of diabetes with hemoglobin A1c below 6.5 followed by primary care most recent hemoglobin A1c 5.3 Cholesterol with LDL cholesterol less than 70, followed by primary care,  most recent 83 continue statin drug Crestor Exercise by walking, 30 min daily,  eat healthy diet with whole grains,  fresh fruits and vegetables F/U in 6 months Discussed risk for recurrent stroke/ TIA and answered additional questions This was a visit requiring 25 minutes and medical decision making of high complexity with extensive review of history, hospital chart, counseling and answering questions Dennie Bible, [facility], [facility], APRN  [facility] [address], [place], [place] [address] [phone]

## 2017-03-07 ENCOUNTER — Encounter: Payer: Self-pay | Admitting: Nurse Practitioner

## 2017-03-07 ENCOUNTER — Telehealth: Payer: Self-pay | Admitting: Nurse Practitioner

## 2017-03-07 ENCOUNTER — Ambulatory Visit (INDEPENDENT_AMBULATORY_CARE_PROVIDER_SITE_OTHER): Payer: BLUE CROSS/BLUE SHIELD | Admitting: Nurse Practitioner

## 2017-03-07 VITALS — BP 157/81 | HR 62 | Ht 61.0 in | Wt 190.0 lb

## 2017-03-07 DIAGNOSIS — R9089 Other abnormal findings on diagnostic imaging of central nervous system: Secondary | ICD-10-CM

## 2017-03-07 DIAGNOSIS — E785 Hyperlipidemia, unspecified: Secondary | ICD-10-CM

## 2017-03-07 DIAGNOSIS — I10 Essential (primary) hypertension: Secondary | ICD-10-CM | POA: Diagnosis not present

## 2017-03-07 DIAGNOSIS — G459 Transient cerebral ischemic attack, unspecified: Secondary | ICD-10-CM | POA: Diagnosis not present

## 2017-03-07 NOTE — Patient Instructions (Signed)
Stressed the importance of management of risk factors to prevent further stroke Continue aspirin for secondary stroke prevention Maintain strict control of hypertension with blood pressure goal below 130/90, today's reading 157/81 continue antihypertensive medications Control of diabetes with hemoglobin A1c below 6.5 followed by primary care most recent hemoglobin A1c 5.3 Cholesterol with LDL cholesterol less than 70, followed by primary care,  most recent 83 continue statin drug Crestor Exercise by walking, 30 min daily,  eat healthy diet with whole grains,  fresh fruits and vegetables F/U in 6 months

## 2017-03-07 NOTE — Progress Notes (Signed)
I have read the note, and I agree with the clinical assessment and plan.  Richard A. Sater, MD, PhD, FAAN Certified in Neurology, Clinical Neurophysiology, Sleep Medicine, Pain Medicine and Neuroimaging  [facility] [address], [address] [place], [place] [address] [phone]  

## 2017-03-07 NOTE — Telephone Encounter (Signed)
Please  let patient know I got the referral from [facility] after I saw her this morning. They wanted [place] to follow up for urine and blood testing  to r/o multiple myeloma. This is usually done by PCP. We will order these  And repeat CT of the head at 6 month follow up.

## 2017-03-08 NOTE — Telephone Encounter (Signed)
LMVM for pt that CM/NP received [facility] notes after you left yesterday and wanted [place] to f/u on lab work requested.  I will be out of office until after 1230, will reach out after returning.

## 2017-03-08 NOTE — Telephone Encounter (Signed)
Pt returned RN's call °

## 2017-03-08 NOTE — Telephone Encounter (Signed)
Spoke to pt and relayed that CM/NP wanted to follow up on lab testing to r/o bone marrow process, for incidental CT finding when in [facility] for stroke.  Pt took down the name of test (protein electrophoresis).  I relayed that per neurologist that could be cysts, and the MRI was ok (the lab tests are to r/o one marrow process.  She had noted that she was told about the CT lesions when in the hospital.  She verbalized that she will come in for labs/urine on Tuesday or Wednesday next week.

## 2017-03-13 ENCOUNTER — Other Ambulatory Visit: Payer: Self-pay | Admitting: *Deleted

## 2017-03-13 ENCOUNTER — Other Ambulatory Visit (INDEPENDENT_AMBULATORY_CARE_PROVIDER_SITE_OTHER): Payer: Self-pay

## 2017-03-13 DIAGNOSIS — R9089 Other abnormal findings on diagnostic imaging of central nervous system: Secondary | ICD-10-CM | POA: Diagnosis not present

## 2017-03-13 DIAGNOSIS — Z0289 Encounter for other administrative examinations: Secondary | ICD-10-CM

## 2017-03-13 NOTE — Addendum Note (Signed)
Addended by: Brandon Melnick on: 03/13/2017 09:25 AM   Modules accepted: Orders

## 2017-03-16 LAB — PROTEIN ELECTROPHORESIS, SERUM
A/G Ratio: 1.1 (ref 0.7–1.7)
ALPHA 1: 0.2 g/dL (ref 0.0–0.4)
ALPHA 2: 0.9 g/dL (ref 0.4–1.0)
Albumin ELP: 3.6 g/dL (ref 2.9–4.4)
BETA: 1.3 g/dL (ref 0.7–1.3)
GLOBULIN, TOTAL: 3.2 g/dL (ref 2.2–3.9)
Gamma Globulin: 0.8 g/dL (ref 0.4–1.8)
Total Protein: 6.8 g/dL (ref 6.0–8.5)

## 2017-03-16 LAB — PROTEIN ELECTROPHORESIS, URINE REFLEX
ALBUMIN ELP UR: 100 %
Alpha-1-Globulin, U: 0 %
Alpha-2-Globulin, U: 0 %
BETA GLOBULIN, U: 0 %
GAMMA GLOBULIN, U: 0 %
Protein, Ur: 12.6 mg/dL

## 2017-03-19 NOTE — Telephone Encounter (Signed)
I called pt and relayed that the lab results were normal (urine and blood protein electrophoresis). Has appt 08/2017 will discuss getting another CT if needed).  Pt verbalized understanding.

## 2017-03-19 NOTE — Telephone Encounter (Signed)
Patient is calling to get lab results

## 2017-03-22 ENCOUNTER — Telehealth: Payer: Self-pay | Admitting: Internal Medicine

## 2017-03-22 DIAGNOSIS — R609 Edema, unspecified: Secondary | ICD-10-CM

## 2017-03-22 NOTE — Telephone Encounter (Signed)
Will forward request to Dr End for referral order

## 2017-03-22 NOTE — Telephone Encounter (Signed)
New Message     Patient would like a referral to a Vascular and Vein doctor, please call with information

## 2017-03-23 NOTE — Telephone Encounter (Signed)
Order placed for referral to [facility]VS, message to [facility] to contact pt., waiting for pt to call me back to let her know referral to [facility] has been done.

## 2017-03-23 NOTE — Telephone Encounter (Signed)
LMTCB

## 2017-03-23 NOTE — Telephone Encounter (Signed)
Will place referral to [facility] for evaluation of possible venous insufficiency.  Nelva Bush, MD [facility] Pager: [phone]

## 2017-03-26 NOTE — Telephone Encounter (Signed)
Pt has been scheduled to see Dr Scot Dock 05/16/17.

## 2017-03-30 ENCOUNTER — Other Ambulatory Visit: Payer: Self-pay

## 2017-03-30 DIAGNOSIS — R609 Edema, unspecified: Secondary | ICD-10-CM

## 2017-04-26 DIAGNOSIS — M79605 Pain in left leg: Secondary | ICD-10-CM | POA: Diagnosis not present

## 2017-04-26 DIAGNOSIS — L309 Dermatitis, unspecified: Secondary | ICD-10-CM | POA: Diagnosis not present

## 2017-04-26 DIAGNOSIS — M79604 Pain in right leg: Secondary | ICD-10-CM | POA: Diagnosis not present

## 2017-04-30 DIAGNOSIS — I8312 Varicose veins of left lower extremity with inflammation: Secondary | ICD-10-CM | POA: Diagnosis not present

## 2017-04-30 DIAGNOSIS — I8311 Varicose veins of right lower extremity with inflammation: Secondary | ICD-10-CM | POA: Diagnosis not present

## 2017-05-02 DIAGNOSIS — F419 Anxiety disorder, unspecified: Secondary | ICD-10-CM | POA: Diagnosis not present

## 2017-05-02 DIAGNOSIS — E7800 Pure hypercholesterolemia, unspecified: Secondary | ICD-10-CM | POA: Diagnosis not present

## 2017-05-02 DIAGNOSIS — I10 Essential (primary) hypertension: Secondary | ICD-10-CM | POA: Diagnosis not present

## 2017-05-02 DIAGNOSIS — E782 Mixed hyperlipidemia: Secondary | ICD-10-CM | POA: Diagnosis not present

## 2017-05-16 ENCOUNTER — Encounter: Payer: BLUE CROSS/BLUE SHIELD | Admitting: Vascular Surgery

## 2017-05-16 ENCOUNTER — Inpatient Hospital Stay (HOSPITAL_COMMUNITY): Admission: RE | Admit: 2017-05-16 | Payer: BLUE CROSS/BLUE SHIELD | Source: Ambulatory Visit

## 2017-05-28 DIAGNOSIS — I8312 Varicose veins of left lower extremity with inflammation: Secondary | ICD-10-CM | POA: Diagnosis not present

## 2017-05-28 DIAGNOSIS — I8311 Varicose veins of right lower extremity with inflammation: Secondary | ICD-10-CM | POA: Diagnosis not present

## 2017-06-20 DIAGNOSIS — M62830 Muscle spasm of back: Secondary | ICD-10-CM | POA: Diagnosis not present

## 2017-06-20 DIAGNOSIS — M545 Low back pain: Secondary | ICD-10-CM | POA: Diagnosis not present

## 2017-07-03 ENCOUNTER — Other Ambulatory Visit: Payer: Self-pay | Admitting: Dermatology

## 2017-07-03 DIAGNOSIS — L309 Dermatitis, unspecified: Secondary | ICD-10-CM | POA: Diagnosis not present

## 2017-07-03 DIAGNOSIS — D485 Neoplasm of uncertain behavior of skin: Secondary | ICD-10-CM | POA: Diagnosis not present

## 2017-07-03 DIAGNOSIS — L409 Psoriasis, unspecified: Secondary | ICD-10-CM | POA: Diagnosis not present

## 2017-07-12 DIAGNOSIS — L309 Dermatitis, unspecified: Secondary | ICD-10-CM | POA: Diagnosis not present

## 2017-07-23 DIAGNOSIS — I872 Venous insufficiency (chronic) (peripheral): Secondary | ICD-10-CM | POA: Diagnosis not present

## 2017-08-06 DIAGNOSIS — I872 Venous insufficiency (chronic) (peripheral): Secondary | ICD-10-CM | POA: Diagnosis not present

## 2017-08-24 DIAGNOSIS — J069 Acute upper respiratory infection, unspecified: Secondary | ICD-10-CM | POA: Diagnosis not present

## 2017-09-05 ENCOUNTER — Ambulatory Visit: Payer: BLUE CROSS/BLUE SHIELD | Admitting: Nurse Practitioner

## 2017-09-05 NOTE — Progress Notes (Signed)
[facility]  PATIENT: [name] DOB: March 30, 1956   REASON FOR VISIT:  follow-up for TIA August 2018 HISTORY FROM: patient     HISTORY OF PRESENT ILLNESS:HISTORY OF PRESENT ILLNESS (per record) [name] a 62 y.o.femalewith a history of hypertension and hyperlipidemia  who presents with right arm and leg weakness and numbness that is improving. She states that started at 2 PM on 01/09/2017 while she was buying gasoline. When she was driving she noticed that her right foot was not working appropriately either. She therefore sought care in the emergency department. At the time of arrival here, she was improving but she still felt like her right arm and leg were heavier than typical. LKW: 2 PM on 01/09/2017 NIHSS: 0 Patient was not administered IV t-PA secondary to presenting with minimal symptoms/arriving outside of the tPA treatment window. She was admitted to General Neurology for further evaluation and treatment. SUBJECTIVE (INTERVAL HISTORY) Her son is at the bedside.  Pt is back to baseline. Admitted that her BP at home not in control. On crestor for HLD. Quit smoking 9 years ago. No alcohol. Denies OSA at home.  Interval history 10/24/2018CM [name], 62 year old female returns for hospital follow-up after admission for right arm and leg weakness and numbness which has resolved. MRI of the head no acute stroke. MRA no large vessel occlusion or high-grade stenosis. Carotid Doppler 1-39% stenosis. 2-D echo 60-65%. LDL 83 hemoglobin A1c 5.3. She is currently on aspirin for secondary stroke prevention with minimal bruising and bleeding. She has not had further stroke or TIA symptoms .Blood pressure in the office today 157/81. She had recent changes to her blood pressure medication. She remains on Crestor without myalgias. She does not exercise. She denies any difficulty with sleeping. She returns for reevaluation UPDATE 4/25/2019CM [name], 62 year old female  returns for follow-up with history of TIA in August 2018.  She is currently on aspirin 312m for secondary stroke prevention with minimal bruising and ambulating she has not had recurrent stroke or TIA symptoms.  She is also on Crestor without complaints of myalgias.  She gets little exercise.  Blood pressure in the office today 149/76. When hospitalized CT of the head showed multiple subcentimeter lucent lesions in the calvarium are indeterminate. Serum and urine protein electrophoresis were negative after her last visit to exclude Multiple Myeloma.  She returns for reevaluation.  She has not had further neurological symptoms  REVIEW OF SYSTEMS: Full 14 system review of systems performed and notable only for those listed, all others are neg:  Constitution fatigue  Cardiovascular: neg Ear/Nose/Throat: neg  Skin: neg Eyes: neg Respiratory: neg Gastroitestinal: neg  Hematology/Lymphatic: neg  Endocrine: neg Musculoskeletal:neg Allergy/Immunology: neg Neurological: neg Psychiatric: neg Sleep : neg   ALLERGIES: Allergies  Allergen Reactions  . Codeine Nausea And Vomiting    HOME MEDICATIONS: Outpatient Medications Prior to Visit  Medication Sig Dispense Refill  . alendronate (FOSAMAX) 70 MG tablet Take 70 mg by mouth every Sunday. Take with a full glass of water on an empty stomach.    .Marland Kitchenaspirin EC 325 MG EC tablet Take 1 tablet (325 mg total) by mouth daily. 30 tablet 0  . celecoxib (CELEBREX) 200 MG capsule Take 200 mg by mouth daily as needed for mild pain.    .Marland Kitchendexlansoprazole (DEXILANT) 60 MG capsule Take 60 mg by mouth daily.    . fluticasone (FLONASE) 50 MCG/ACT nasal spray Place 2 sprays into both nostrils daily.    .Marland Kitchen  losartan (COZAAR) 50 MG tablet Take 100 mg by mouth every evening.    . metoprolol tartrate (LOPRESSOR) 25 MG tablet Take 0.5 tablets (12.5 mg total) by mouth 2 (two) times daily. (Patient taking differently: Take 12.5-25 mg by mouth See admin instructions. 25 mg  in the morning 12.5 mg in the evening)    . rosuvastatin (CRESTOR) 20 MG tablet Take 1 tablet (20 mg total) by mouth every evening. 30 tablet 0  . triazolam (HALCION) 0.25 MG tablet Take 0.25 mg by mouth at bedtime.     No facility-administered medications prior to visit.     PAST MEDICAL HISTORY: Past Medical History:  Diagnosis Date  . Chest discomfort   . GERD (gastroesophageal reflux disease)   . High cholesterol   . Hypertension   . Joint pain   . 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 Osteoporosis    T SCORE OF -2.7 ON DEXA IN 2013    PAST SURGICAL HISTORY: Past Surgical History:  Procedure Laterality Date  . APPENDECTOMY    . CHOLECYSTECTOMY      FAMILY HISTORY: Family History  Problem Relation Age of Onset  . Microcephaly Father   . CAD Father   . Heart disease Sister        atrial fibrillation  . Heart attack Brother        died 69  . Colon polyps Mother   . Hypertension Mother   . Colon cancer Neg Hx   . Liver disease Neg Hx     SOCIAL HISTORY: Social History   Socioeconomic History  . Marital status: Married    Spouse name: Not on file  . Number of children: Not on file  . Years of education: Not on file  . Highest education level: Not on file  Occupational History  . Occupation: UNEMPLOYED  Social Needs  . Financial resource strain: Not on file  . Food insecurity:    Worry: Not on file    Inability: Not on file  . Transportation needs:    Medical: Not on file    Non-medical: Not on file  Tobacco Use  . Smoking status: Former Smoker    Packs/day: 2.50    Years: 35.00    Pack years: 87.50    Types: Cigarettes    Last attempt to quit: 12/24/2008    Years since quitting: 8.7  . Smokeless tobacco: Never Used  Substance and Sexual Activity  . Alcohol use: No  . Drug use: No  . Sexual activity: Not on file  Lifestyle  . Physical activity:    Days per week: Not on file    Minutes per session: Not on file  . Stress: Not on file    Relationships  . Social connections:    Talks on phone: Not on file    Gets together: Not on file    Attends religious service: Not on file    Active member of club or organization: Not on file    Attends meetings of clubs or organizations: Not on file    Relationship status: Not on file  . Intimate partner violence:    Fear of current or ex partner: Not on file    Emotionally abused: Not on file    Physically abused: Not on file    Forced sexual activity: Not on file  Other Topics Concern  . Not on file  Social History Narrative  . Not on file     PHYSICAL EXAM  Vitals:   09/06/17 1001  BP: (!) 149/76  Pulse: (!) 54  Weight: 197 lb 3.2 oz (89.4 kg)  Height: 5' 1" (1.549 m)   Body mass index is 37.26 kg/m.  Generalized: Well developed, obese female in no acute distress  Head: normocephalic and atraumatic,. Oropharynx benign  Neck: Supple, no carotid bruits  Cardiac: Regular rate rhythm, no murmur  Musculoskeletal: No deformity   Neurological examination   Mentation: Alert oriented to time, place, history taking. Attention span and concentration appropriate. Recent and remote memory intact.  Follows all commands speech and language fluent.   Cranial nerve II-XII: Pupils were equal round reactive to light extraocular movements were full, visual field were full on confrontational test. Facial sensation and strength were normal. hearing was intact to finger rubbing bilaterally. Uvula tongue midline. head turning and shoulder shrug were normal and symmetric.Tongue protrusion into cheek strength was normal. Motor: normal bulk and tone, full strength in the BUE, BLE, fine finger movements normal, no pronator drift. No focal weakness Sensory: normal and symmetric to light touch, pinprick, and  Vibration, in the upper and lower extremities Coordination: finger-nose-finger, heel-to-shin bilaterally, no dysmetria, no tremor Reflexes: 1+ upper lower and symmetric, plantar  responses were flexor bilaterally. Gait and Station: Rising up from seated position without assistance, normal stance,  moderate stride, good arm swing, smooth turning, able to perform tiptoe, and heel walking without difficulty. Tandem gait is steady.  No assistive device  DIAGNOSTIC DATA (LABS, IMAGING, TESTING) - I reviewed patient records, labs, notes, testing and imaging myself where available.  Lab Results  Component Value Date   WBC 7.5 01/10/2017   HGB 13.1 01/10/2017   HCT 39.1 01/10/2017   MCV 82.0 01/10/2017   PLT 244 01/10/2017      Component Value Date/Time   NA 141 01/10/2017 0626   K 3.6 01/10/2017 0626   CL 107 01/10/2017 0626   CO2 26 01/10/2017 0626   GLUCOSE 117 (H) 01/10/2017 0626   BUN 9 01/10/2017 0626   CREATININE 0.85 01/10/2017 0626   CREATININE 0.79 04/20/2016 1659   CALCIUM 8.8 (L) 01/10/2017 0626   PROT 6.8 03/13/2017 0957   ALBUMIN 3.8 01/09/2017 1913   AST 26 01/09/2017 1913   ALT 23 01/09/2017 1913   ALKPHOS 173 (H) 01/09/2017 1913   BILITOT 0.5 01/09/2017 1913   GFRNONAA >60 01/10/2017 0626   GFRAA >60 01/10/2017 0626   Lab Results  Component Value Date   CHOL 171 01/10/2017   HDL 36 (L) 01/10/2017   LDLCALC 83 01/10/2017   TRIG 262 (H) 01/10/2017   CHOLHDL 4.8 01/10/2017   Lab Results  Component Value Date   HGBA1C 5.7 (H) 01/10/2017   No results found for: [id number] Lab Results  Component Value Date   TSH 1.63 04/20/2016      ASSESSMENT AND PLAN Catherine. QUILLA FREEZE is a 62 y.o. female with history of hypertension and hyperlipidemia  who presents with right arm and leg weakness and numbness.She did not receive IV t-PA due to presenting with minimal symptoms/arriving outside of the tPA treatment window. She is here for hospital follow-up for TIA.MRI of the head no acute stroke. MRA no large vessel occlusion or high-grade stenosis. Carotid Doppler 139% stenosis. 2-D echo 60-65%. LDL 83 hemoglobin A1c 5.3. CT of the head showed  multiple subcentimeter lucent lesions in the calvarium are indeterminate. Serum and urine protein electrophoresis were negative after her last visit to exclude Multiple Myeloma. The patient is a  current patient of Dr. Erlinda Hong  who is out of the office today . This note is sent to the work in doctor.     PLAN:Stressed the importance of management of risk factors to prevent further stroke Continue aspirin for secondary stroke prevention Maintain strict control of hypertension with blood pressure goal below 130/90, today's reading 149/76 continue antihypertensive medications Control of diabetes with hemoglobin A1c below 6.5 followed by primary care Cholesterol with LDL cholesterol less than 70, followed by primary care,   continue statin drug Crestor Exercise by walking, 30 min daily,  eat healthy diet with whole grains,  fresh fruits and vegetables Blood and urine protein electrophoresis was normal.  Discharge from stroke clinic I spent 25 in total face to face time with the patient more than 50% of which was spent counseling and coordination of care, reviewing test results reviewing medications and discussing and reviewing the diagnosis of stroke/ TIA and management of risk factors.  Patient was also given written information to refer to at a later date.  Dennie Bible, [facility], [facility], APRN  [facility] [address], [place], [place] [address] [phone]

## 2017-09-06 ENCOUNTER — Ambulatory Visit: Payer: BLUE CROSS/BLUE SHIELD | Admitting: Nurse Practitioner

## 2017-09-06 ENCOUNTER — Encounter: Payer: Self-pay | Admitting: Nurse Practitioner

## 2017-09-06 VITALS — BP 149/76 | HR 54 | Ht 61.0 in | Wt 197.2 lb

## 2017-09-06 DIAGNOSIS — I10 Essential (primary) hypertension: Secondary | ICD-10-CM

## 2017-09-06 DIAGNOSIS — E785 Hyperlipidemia, unspecified: Secondary | ICD-10-CM | POA: Diagnosis not present

## 2017-09-06 DIAGNOSIS — G459 Transient cerebral ischemic attack, unspecified: Secondary | ICD-10-CM

## 2017-09-06 NOTE — Patient Instructions (Addendum)
Continue aspirin for secondary stroke prevention Maintain strict control of hypertension with blood pressure goal below 130/90, today's reading 149/76 continue antihypertensive medications Control of diabetes with hemoglobin A1c below 6.5 followed by primary care Cholesterol with LDL cholesterol less than 70, followed by primary care,   continue statin drug Crestor Exercise by walking, 30 min daily,  eat healthy diet with whole grains,  fresh fruits and vegetables Blood and urine protein electrophoresis was normal.  Discharge from stroke clinic   Stroke Prevention Some health problems and behaviors may make it more likely for you to have a stroke. Below are ways to lessen your risk of having a stroke.  Be active for at least 30 minutes on most or all days.  Do not smoke. Try not to be around others who smoke.  Do not drink too much alcohol. ? Do not have more than 2 drinks a day if you are a man. ? Do not have more than 1 drink a day if you are a woman and are not pregnant.  Eat healthy foods, such as fruits and vegetables. If you were put on a specific diet, follow the diet as told.  Keep your cholesterol levels under control through diet and medicines. Look for foods that are low in saturated fat, trans fat, cholesterol, and are high in fiber.  If you have diabetes, follow all diet plans and take your medicine as told.  Ask your doctor if you need treatment to lower your blood pressure. If you have high blood pressure (hypertension), follow all diet plans and take your medicine as told by your doctor.  If you are 32-8 years old, have your blood pressure checked every 3-5 years. If you are age 35 or older, have your blood pressure checked every year.  Keep a healthy weight. Eat foods that are low in calories, salt, saturated fat, trans fat, and cholesterol.  Do not take drugs.  Avoid birth control pills, if this applies. Talk to your doctor about the risks of taking birth control  pills.  Talk to your doctor if you have sleep problems (sleep apnea).  Take all medicine as told by your doctor. ? You may be told to take aspirin or blood thinner medicine. Take this medicine as told by your doctor. ? Understand your medicine instructions.  Make sure any other conditions you have are being taken care of.  Get help right away if:  You suddenly lose feeling (you feel numb) or have weakness in your face, arm, or leg.  Your face or eyelid hangs down to one side.  You suddenly feel confused.  You have trouble talking (aphasia) or understanding what people are saying.  You suddenly have trouble seeing in one or both eyes.  You suddenly have trouble walking.  You are dizzy.  You lose your balance or your movements are clumsy (uncoordinated).  You suddenly have a very bad headache and you do not know the cause.  You have new chest pain.  Your heart feels like it is fluttering or skipping a beat (irregular heartbeat). Do not wait to see if the symptoms above go away. Get help right away. Call your local emergency services (911 in [place] Do not drive yourself to the hospital. This information is not intended to replace advice given to you by your health care provider. Make sure you discuss any questions you have with your health care provider. Document Released: 10/31/2011 Document Revised: 10/07/2015 Document Reviewed: 11/01/2012 Elsevier Interactive Patient Education  2018 Elsevier Inc.  

## 2017-09-07 NOTE — Progress Notes (Signed)
I agree with the above plan 

## 2017-11-19 DIAGNOSIS — H25813 Combined forms of age-related cataract, bilateral: Secondary | ICD-10-CM | POA: Diagnosis not present

## 2017-11-19 DIAGNOSIS — H3554 Dystrophies primarily involving the retinal pigment epithelium: Secondary | ICD-10-CM | POA: Diagnosis not present

## 2017-11-19 DIAGNOSIS — H5500 Unspecified nystagmus: Secondary | ICD-10-CM | POA: Diagnosis not present

## 2017-11-19 DIAGNOSIS — H53002 Unspecified amblyopia, left eye: Secondary | ICD-10-CM | POA: Diagnosis not present

## 2017-12-10 DIAGNOSIS — I10 Essential (primary) hypertension: Secondary | ICD-10-CM | POA: Diagnosis not present

## 2017-12-10 DIAGNOSIS — R42 Dizziness and giddiness: Secondary | ICD-10-CM | POA: Diagnosis not present

## 2017-12-10 DIAGNOSIS — J069 Acute upper respiratory infection, unspecified: Secondary | ICD-10-CM | POA: Diagnosis not present

## 2017-12-10 DIAGNOSIS — R001 Bradycardia, unspecified: Secondary | ICD-10-CM | POA: Diagnosis not present

## 2017-12-17 DIAGNOSIS — R001 Bradycardia, unspecified: Secondary | ICD-10-CM | POA: Diagnosis not present

## 2017-12-17 DIAGNOSIS — I10 Essential (primary) hypertension: Secondary | ICD-10-CM | POA: Diagnosis not present

## 2017-12-27 DIAGNOSIS — I10 Essential (primary) hypertension: Secondary | ICD-10-CM | POA: Diagnosis not present

## 2018-01-08 ENCOUNTER — Other Ambulatory Visit: Payer: Self-pay | Admitting: Family Medicine

## 2018-01-08 DIAGNOSIS — R748 Abnormal levels of other serum enzymes: Secondary | ICD-10-CM

## 2018-01-18 ENCOUNTER — Ambulatory Visit
Admission: RE | Admit: 2018-01-18 | Discharge: 2018-01-18 | Disposition: A | Discharge status: Home / Self Care | Payer: BLUE CROSS/BLUE SHIELD | Source: Ambulatory Visit | Attending: Family Medicine | Admitting: Family Medicine

## 2018-01-18 DIAGNOSIS — R748 Abnormal levels of other serum enzymes: Secondary | ICD-10-CM

## 2018-01-18 DIAGNOSIS — R7989 Other specified abnormal findings of blood chemistry: Secondary | ICD-10-CM | POA: Diagnosis not present

## 2018-03-15 DIAGNOSIS — F419 Anxiety disorder, unspecified: Secondary | ICD-10-CM | POA: Diagnosis not present

## 2018-03-15 DIAGNOSIS — Z23 Encounter for immunization: Secondary | ICD-10-CM | POA: Diagnosis not present

## 2018-03-15 DIAGNOSIS — Z0000 Encounter for general adult medical examination without abnormal findings: Secondary | ICD-10-CM | POA: Diagnosis not present

## 2018-03-15 DIAGNOSIS — Z1211 Encounter for screening for malignant neoplasm of colon: Secondary | ICD-10-CM | POA: Diagnosis not present

## 2018-03-15 DIAGNOSIS — K219 Gastro-esophageal reflux disease without esophagitis: Secondary | ICD-10-CM | POA: Diagnosis not present

## 2018-03-19 ENCOUNTER — Other Ambulatory Visit: Payer: Self-pay | Admitting: Family Medicine

## 2018-03-19 DIAGNOSIS — M810 Age-related osteoporosis without current pathological fracture: Secondary | ICD-10-CM

## 2018-03-19 DIAGNOSIS — K5900 Constipation, unspecified: Secondary | ICD-10-CM | POA: Diagnosis not present

## 2018-03-19 DIAGNOSIS — R748 Abnormal levels of other serum enzymes: Secondary | ICD-10-CM | POA: Diagnosis not present

## 2018-04-12 DIAGNOSIS — J069 Acute upper respiratory infection, unspecified: Secondary | ICD-10-CM | POA: Diagnosis not present

## 2018-04-29 DIAGNOSIS — R748 Abnormal levels of other serum enzymes: Secondary | ICD-10-CM | POA: Diagnosis not present

## 2018-04-29 DIAGNOSIS — K219 Gastro-esophageal reflux disease without esophagitis: Secondary | ICD-10-CM | POA: Diagnosis not present

## 2018-04-29 DIAGNOSIS — K5900 Constipation, unspecified: Secondary | ICD-10-CM | POA: Diagnosis not present

## 2018-06-04 DIAGNOSIS — H5500 Unspecified nystagmus: Secondary | ICD-10-CM | POA: Diagnosis not present

## 2018-06-04 DIAGNOSIS — H25813 Combined forms of age-related cataract, bilateral: Secondary | ICD-10-CM | POA: Diagnosis not present

## 2018-06-25 DIAGNOSIS — R748 Abnormal levels of other serum enzymes: Secondary | ICD-10-CM | POA: Diagnosis not present

## 2018-08-21 DIAGNOSIS — E785 Hyperlipidemia, unspecified: Secondary | ICD-10-CM | POA: Diagnosis not present

## 2018-08-21 DIAGNOSIS — I10 Essential (primary) hypertension: Secondary | ICD-10-CM | POA: Diagnosis not present

## 2018-08-21 DIAGNOSIS — R748 Abnormal levels of other serum enzymes: Secondary | ICD-10-CM | POA: Diagnosis not present

## 2018-08-21 DIAGNOSIS — G4700 Insomnia, unspecified: Secondary | ICD-10-CM | POA: Diagnosis not present

## 2018-08-24 IMAGING — MR MR MRA HEAD W/O CM
9 of 12 series · 31 of 48 positions shown · non-contrast
Comparison: CT HEAD January 09, 2017 at 0181 hours

CLINICAL DATA: RIGHT arm and leg weakness and numbness, beginning
at 2 p.m. while driving. Symptoms now improving. Assess transient
ischemic attack. History of hypertension and hypercholesterolemia.

EXAM:
MRI HEAD WITHOUT CONTRAST
MRA HEAD WITHOUT CONTRAST
TECHNIQUE: Multiplanar, multiecho pulse sequences of the brain and surrounding
structures were obtained without intravenous contrast. Angiographic
images of the head were obtained using MRA technique without
contrast.

[Series 3: T1 · sagittal · 5.0mm · 0.47mm/px · 1 of 23 slices shown]
[im 1/23]
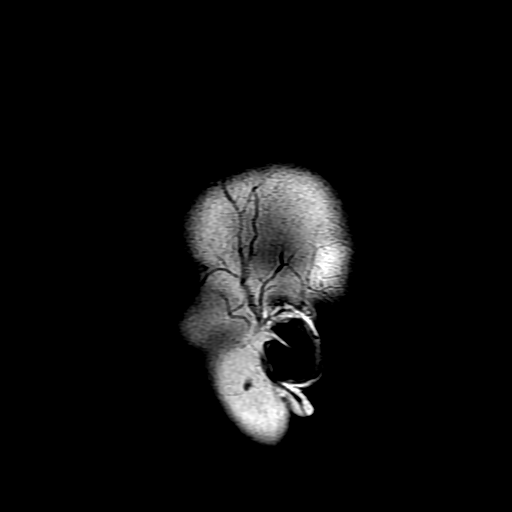

[Series 4: DWI · axial · 3.0mm · 1.09mm/px · z∈[-80,+64]mm · 8 of 98 slices shown (1 of 4)]
[im 1/98]
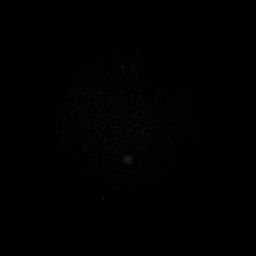
[im 14/98]
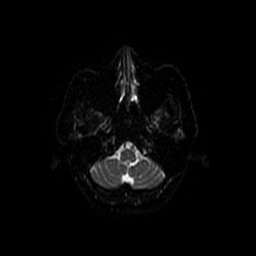
[im 28/98]
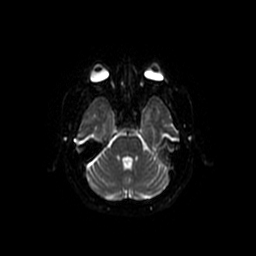
[im 42/98]
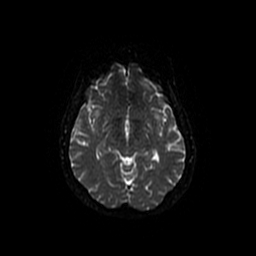
[im 56/98]
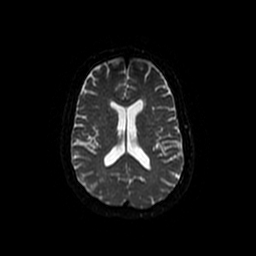
[im 70/98]
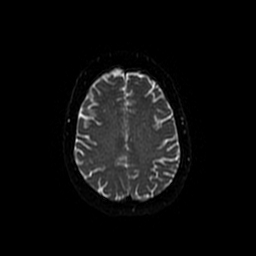
[im 84/98]
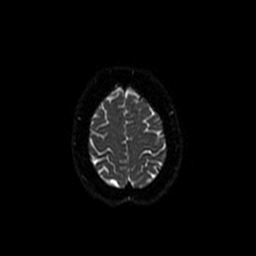
[im 98/98]
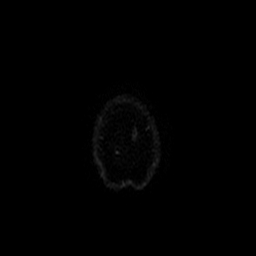

[Series 5: DWI · coronal · 5.0mm · 1.09mm/px · 5 of 62 slices shown (2 of 4)]
[im 1/62]
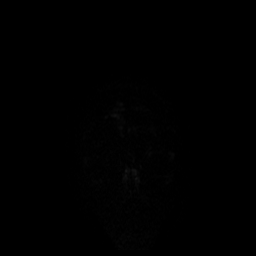
[im 16/62]
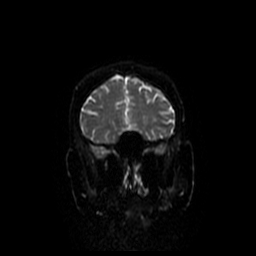
[im 31/62]
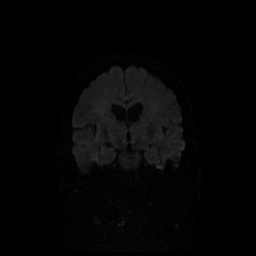
[im 46/62]
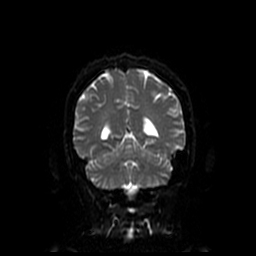
[im 62/62]
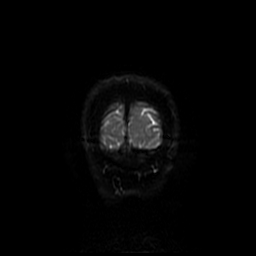

[Series 6: (id) mt fs · axial · 1.4mm · 0.43mm/px · z∈[-88,-33]mm · 5 of 136 slices shown]
[im 1/136]
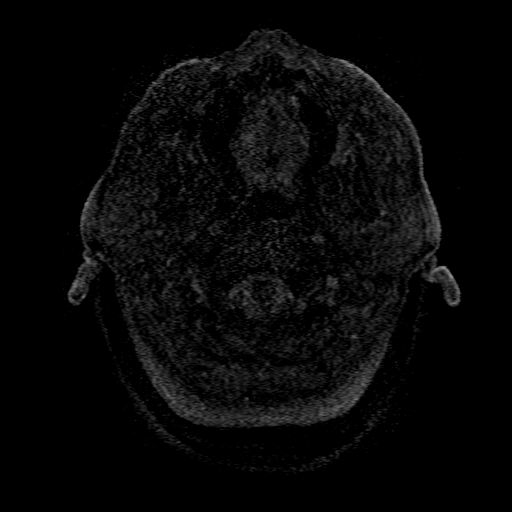
[im 28/136]
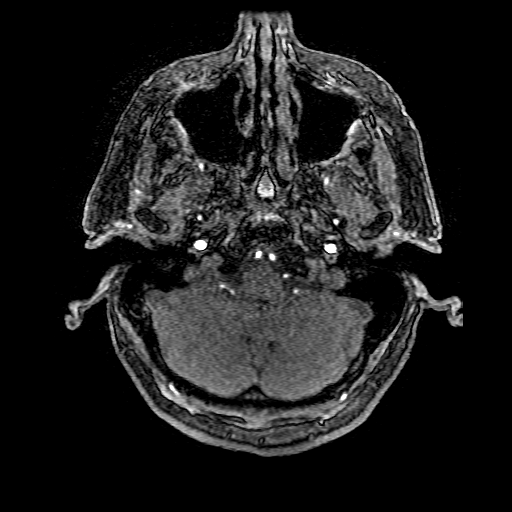
[im 41/136]
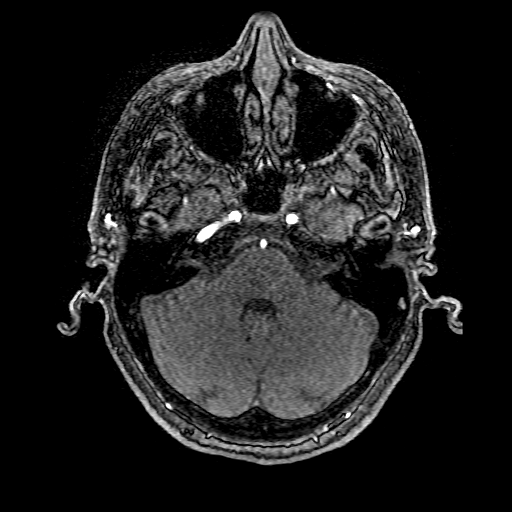
[im 55/136]
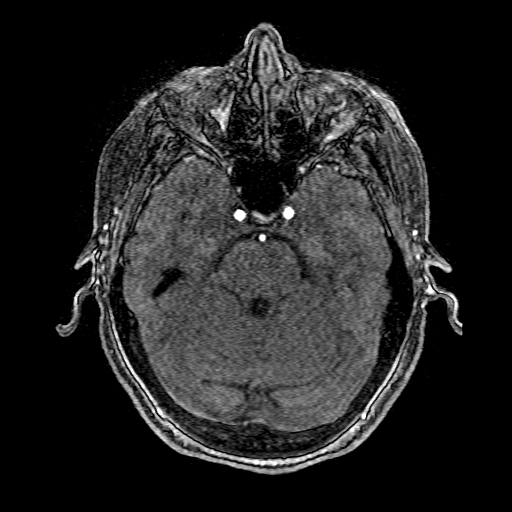
[im 82/136]
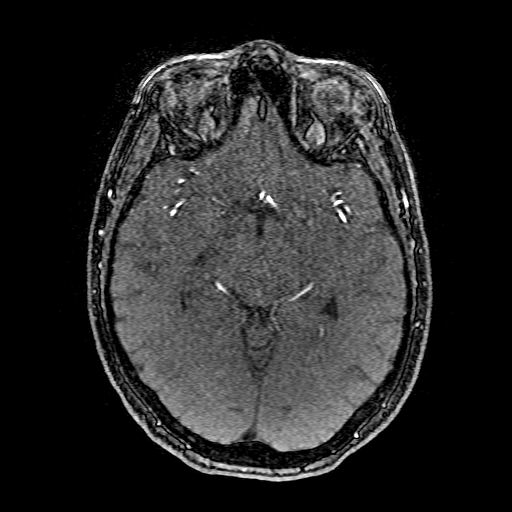

[Series 7: T2 · axial · 5.0mm · 0.43mm/px · z∈[-77,+67]mm · 2 of 25 slices shown (1 of 2)]
[im 1/25]
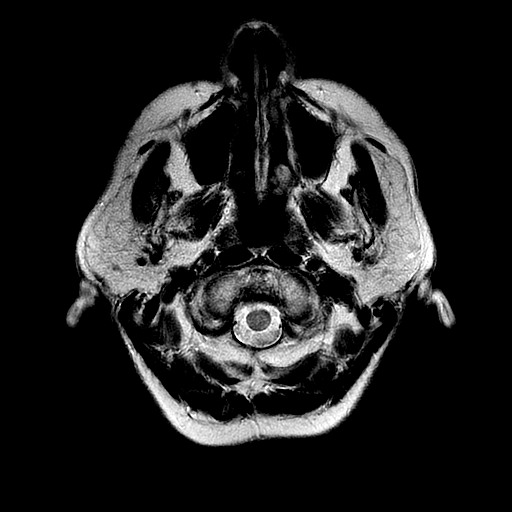
[im 25/25]
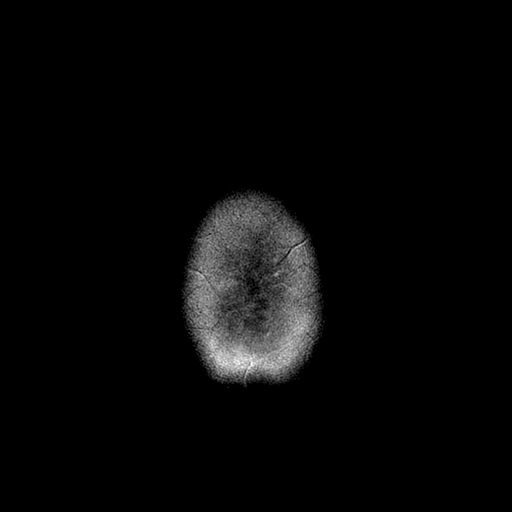

[Series 8: FLAIR · axial · 5.0mm · 0.43mm/px · z∈[-77,+67]mm · 2 of 25 slices shown]
[im 1/25]
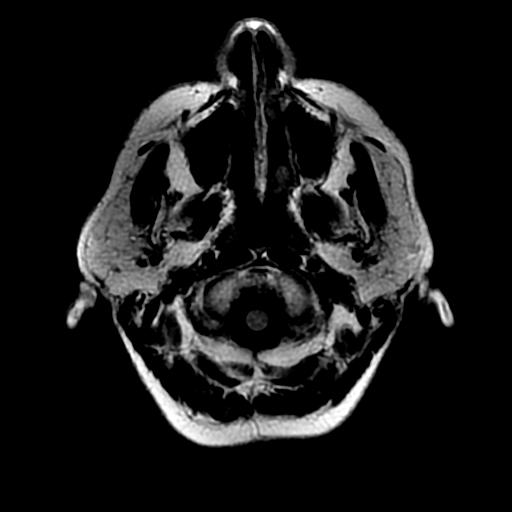
[im 25/25]
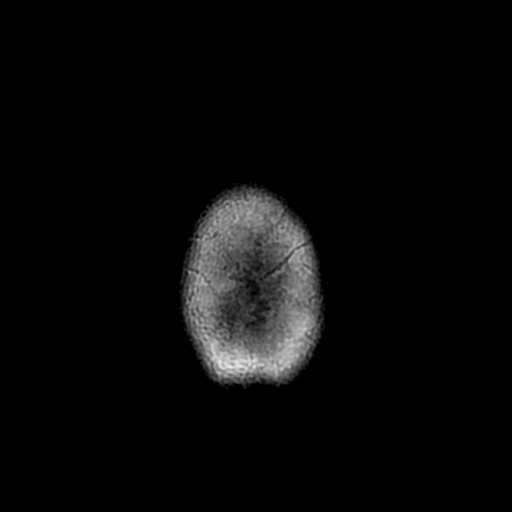

[Series 11: T2 · coronal · 5.0mm · 0.43mm/px · 2 of 26 slices shown (2 of 2)]
[im 1/26]
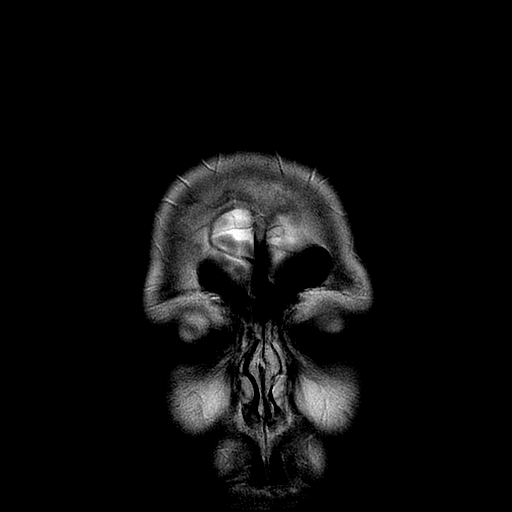
[im 26/26]
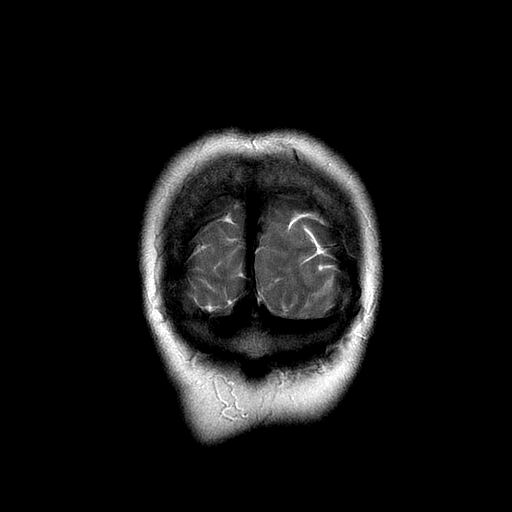

[Series 400: DWI · axial · 3.0mm · 1.09mm/px · z∈[-80,+64]mm · 4 of 49 slices shown (3 of 4)]
[im 1/49]
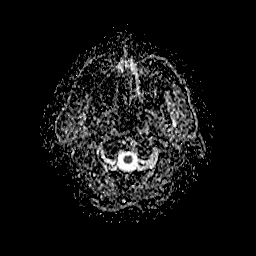
[im 17/49]
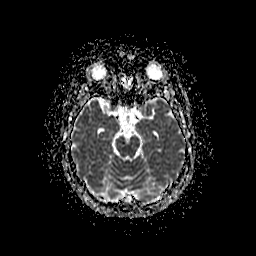
[im 33/49]
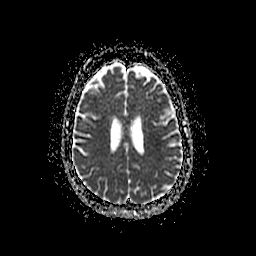
[im 49/49]
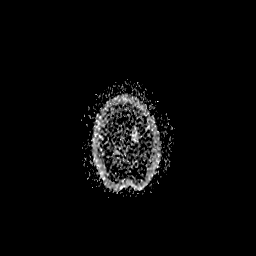

[Series 500: DWI · coronal · 5.0mm · 1.09mm/px · 2 of 31 slices shown (4 of 4)]
[im 1/31]
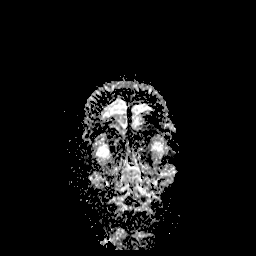
[im 31/31]
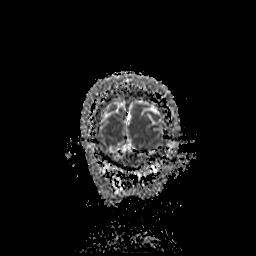

[31 of 48 positions shown; findings below may reference images not displayed]

FINDINGS: MRI HEAD FINDINGS

BRAIN: No reduced diffusion to suggest acute ischemia. No
susceptibility artifact to suggest hemorrhage. The ventricles and
sulci are normal for patient's age. Scattered subcentimeter
supratentorial white matter FLAIR T2 hyperintensities. No suspicious
parenchymal signal, mass or mass effect. No abnormal extra-axial
fluid collections.

VASCULAR: Normal major intracranial vascular flow voids present at
skull base.

SKULL AND UPPER CERVICAL SPINE: No abnormal sellar expansion. No
suspicious calvarial bone marrow signal. Craniocervical junction
maintained.

SINUSES/ORBITS: Paranasal sinuses are well-aerated. Mastoid air
cells are well aerated. The included ocular globes and orbital
contents are non-suspicious.

OTHER: None.

MRA HEAD FINDINGS

ANTERIOR CIRCULATION: Normal flow related enhancement of the
included cervical, petrous, cavernous and supraclinoid internal
carotid arteries. Patent anterior communicating artery. Patent
anterior and middle cerebral arteries, including distal segments.
Moderate luminal irregularity RIGHT greater than LEFT mid and distal
middle cerebral artery's.

No large vessel occlusion, high-grade stenosis, aneurysm.

POSTERIOR CIRCULATION: Codominant vertebral arteries. Basilar artery
is patent, with normal flow related enhancement of the main branch
vessels. Patent posterior cerebral arteries, mild luminal
irregularity. Robust RIGHT posterior communicating artery present.

No large vessel occlusion, high-grade stenosis,  aneurysm.

ANATOMIC VARIANTS: None.

Source images and MIP images were reviewed.
IMPRESSION: MRI HEAD:

1. No acute intracranial process.
2. Mild chronic small vessel ischemic disease.
MRA HEAD:

1. No emergent large vessel occlusion or severe stenosis.
2. Moderate middle cerebral arteries and mild posterior cerebral
arteries luminal regularity compatible atherosclerosis, less likely
vasculopathy.

## 2018-11-21 DIAGNOSIS — H25012 Cortical age-related cataract, left eye: Secondary | ICD-10-CM | POA: Diagnosis not present

## 2018-11-21 DIAGNOSIS — H268 Other specified cataract: Secondary | ICD-10-CM | POA: Diagnosis not present

## 2018-11-21 DIAGNOSIS — H5703 Miosis: Secondary | ICD-10-CM | POA: Diagnosis not present

## 2018-11-26 DIAGNOSIS — K743 Primary biliary cirrhosis: Secondary | ICD-10-CM | POA: Diagnosis not present

## 2018-12-09 DIAGNOSIS — J019 Acute sinusitis, unspecified: Secondary | ICD-10-CM | POA: Diagnosis not present

## 2018-12-09 DIAGNOSIS — M26622 Arthralgia of left temporomandibular joint: Secondary | ICD-10-CM | POA: Diagnosis not present

## 2018-12-09 DIAGNOSIS — J309 Allergic rhinitis, unspecified: Secondary | ICD-10-CM | POA: Diagnosis not present

## 2018-12-26 DIAGNOSIS — H2511 Age-related nuclear cataract, right eye: Secondary | ICD-10-CM | POA: Diagnosis not present

## 2018-12-26 DIAGNOSIS — H5703 Miosis: Secondary | ICD-10-CM | POA: Diagnosis not present

## 2019-01-02 DIAGNOSIS — R748 Abnormal levels of other serum enzymes: Secondary | ICD-10-CM | POA: Diagnosis not present

## 2019-01-10 DIAGNOSIS — N3000 Acute cystitis without hematuria: Secondary | ICD-10-CM | POA: Diagnosis not present

## 2019-01-21 DIAGNOSIS — N390 Urinary tract infection, site not specified: Secondary | ICD-10-CM | POA: Diagnosis not present

## 2019-01-21 DIAGNOSIS — R300 Dysuria: Secondary | ICD-10-CM | POA: Diagnosis not present

## 2019-03-31 DIAGNOSIS — N329 Bladder disorder, unspecified: Secondary | ICD-10-CM | POA: Diagnosis not present

## 2019-04-17 ENCOUNTER — Other Ambulatory Visit: Payer: Self-pay

## 2019-04-17 DIAGNOSIS — Z20822 Contact with and (suspected) exposure to covid-19: Secondary | ICD-10-CM

## 2019-04-17 DIAGNOSIS — I10 Essential (primary) hypertension: Secondary | ICD-10-CM | POA: Diagnosis not present

## 2019-04-17 DIAGNOSIS — M810 Age-related osteoporosis without current pathological fracture: Secondary | ICD-10-CM | POA: Diagnosis not present

## 2019-04-17 DIAGNOSIS — K219 Gastro-esophageal reflux disease without esophagitis: Secondary | ICD-10-CM | POA: Diagnosis not present

## 2019-04-17 DIAGNOSIS — F419 Anxiety disorder, unspecified: Secondary | ICD-10-CM | POA: Diagnosis not present

## 2019-04-20 LAB — NOVEL CORONAVIRUS, NAA: SARS-CoV-2, NAA: NOT DETECTED

## 2019-04-21 ENCOUNTER — Other Ambulatory Visit: Payer: Self-pay | Admitting: Family Medicine

## 2019-04-21 DIAGNOSIS — Z1231 Encounter for screening mammogram for malignant neoplasm of breast: Secondary | ICD-10-CM

## 2019-04-21 DIAGNOSIS — M810 Age-related osteoporosis without current pathological fracture: Secondary | ICD-10-CM

## 2019-05-13 DIAGNOSIS — R05 Cough: Secondary | ICD-10-CM | POA: Diagnosis not present

## 2019-05-13 DIAGNOSIS — R0981 Nasal congestion: Secondary | ICD-10-CM | POA: Diagnosis not present

## 2019-06-26 ENCOUNTER — Encounter: Payer: Self-pay | Admitting: General Practice

## 2019-07-12 ENCOUNTER — Ambulatory Visit: Payer: BC Managed Care – PPO

## 2019-07-15 ENCOUNTER — Ambulatory Visit
Admission: RE | Admit: 2019-07-15 | Discharge: 2019-07-15 | Disposition: A | Discharge status: Home / Self Care | Payer: BC Managed Care – PPO | Source: Ambulatory Visit | Attending: Family Medicine | Admitting: Family Medicine

## 2019-07-15 ENCOUNTER — Other Ambulatory Visit: Payer: Self-pay

## 2019-07-15 DIAGNOSIS — M810 Age-related osteoporosis without current pathological fracture: Secondary | ICD-10-CM | POA: Diagnosis not present

## 2019-07-15 DIAGNOSIS — Z1231 Encounter for screening mammogram for malignant neoplasm of breast: Secondary | ICD-10-CM

## 2019-07-15 DIAGNOSIS — Z780 Asymptomatic menopausal state: Secondary | ICD-10-CM | POA: Diagnosis not present

## 2019-07-15 DIAGNOSIS — M85851 Other specified disorders of bone density and structure, right thigh: Secondary | ICD-10-CM | POA: Diagnosis not present

## 2019-07-17 ENCOUNTER — Other Ambulatory Visit: Payer: Self-pay | Admitting: Family Medicine

## 2019-07-17 DIAGNOSIS — R928 Other abnormal and inconclusive findings on diagnostic imaging of breast: Secondary | ICD-10-CM

## 2019-07-18 ENCOUNTER — Other Ambulatory Visit: Payer: Self-pay | Admitting: Family Medicine

## 2019-07-23 ENCOUNTER — Ambulatory Visit
Admission: RE | Admit: 2019-07-23 | Discharge: 2019-07-23 | Disposition: A | Discharge status: Home / Self Care | Payer: BC Managed Care – PPO | Source: Ambulatory Visit | Attending: Family Medicine | Admitting: Family Medicine

## 2019-07-23 ENCOUNTER — Other Ambulatory Visit: Payer: Self-pay

## 2019-07-23 ENCOUNTER — Ambulatory Visit: Payer: BC Managed Care – PPO

## 2019-07-23 DIAGNOSIS — E1159 Type 2 diabetes mellitus with other circulatory complications: Secondary | ICD-10-CM | POA: Diagnosis not present

## 2019-07-23 DIAGNOSIS — E7800 Pure hypercholesterolemia, unspecified: Secondary | ICD-10-CM | POA: Diagnosis not present

## 2019-07-23 DIAGNOSIS — Z5181 Encounter for therapeutic drug level monitoring: Secondary | ICD-10-CM | POA: Diagnosis not present

## 2019-07-23 DIAGNOSIS — K219 Gastro-esophageal reflux disease without esophagitis: Secondary | ICD-10-CM | POA: Diagnosis not present

## 2019-07-23 DIAGNOSIS — R928 Other abnormal and inconclusive findings on diagnostic imaging of breast: Secondary | ICD-10-CM

## 2019-07-23 DIAGNOSIS — E559 Vitamin D deficiency, unspecified: Secondary | ICD-10-CM | POA: Diagnosis not present

## 2019-07-28 DIAGNOSIS — H53022 Refractive amblyopia, left eye: Secondary | ICD-10-CM | POA: Diagnosis not present

## 2019-07-28 DIAGNOSIS — H5501 Congenital nystagmus: Secondary | ICD-10-CM | POA: Diagnosis not present

## 2019-07-28 DIAGNOSIS — Z961 Presence of intraocular lens: Secondary | ICD-10-CM | POA: Diagnosis not present

## 2019-08-11 DIAGNOSIS — J309 Allergic rhinitis, unspecified: Secondary | ICD-10-CM | POA: Diagnosis not present

## 2019-08-11 DIAGNOSIS — H7291 Unspecified perforation of tympanic membrane, right ear: Secondary | ICD-10-CM | POA: Diagnosis not present

## 2019-09-11 ENCOUNTER — Encounter: Payer: Self-pay | Admitting: Acute Care

## 2019-11-03 DIAGNOSIS — M549 Dorsalgia, unspecified: Secondary | ICD-10-CM | POA: Diagnosis not present

## 2020-01-14 DIAGNOSIS — G4700 Insomnia, unspecified: Secondary | ICD-10-CM | POA: Diagnosis not present

## 2020-01-14 DIAGNOSIS — E785 Hyperlipidemia, unspecified: Secondary | ICD-10-CM | POA: Diagnosis not present

## 2020-01-14 DIAGNOSIS — R69 Illness, unspecified: Secondary | ICD-10-CM | POA: Diagnosis not present

## 2020-01-14 DIAGNOSIS — M810 Age-related osteoporosis without current pathological fracture: Secondary | ICD-10-CM | POA: Diagnosis not present

## 2020-01-14 DIAGNOSIS — M545 Low back pain: Secondary | ICD-10-CM | POA: Diagnosis not present

## 2020-01-14 DIAGNOSIS — K219 Gastro-esophageal reflux disease without esophagitis: Secondary | ICD-10-CM | POA: Diagnosis not present

## 2020-01-14 DIAGNOSIS — G8929 Other chronic pain: Secondary | ICD-10-CM | POA: Diagnosis not present

## 2020-01-14 DIAGNOSIS — I10 Essential (primary) hypertension: Secondary | ICD-10-CM | POA: Diagnosis not present

## 2020-01-14 DIAGNOSIS — E1159 Type 2 diabetes mellitus with other circulatory complications: Secondary | ICD-10-CM | POA: Diagnosis not present

## 2020-01-30 DIAGNOSIS — E871 Hypo-osmolality and hyponatremia: Secondary | ICD-10-CM | POA: Diagnosis not present

## 2020-02-06 DIAGNOSIS — M5136 Other intervertebral disc degeneration, lumbar region: Secondary | ICD-10-CM | POA: Diagnosis not present

## 2020-02-06 DIAGNOSIS — M545 Low back pain: Secondary | ICD-10-CM | POA: Diagnosis not present

## 2020-02-06 DIAGNOSIS — M8580 Other specified disorders of bone density and structure, unspecified site: Secondary | ICD-10-CM | POA: Diagnosis not present

## 2020-02-10 DIAGNOSIS — Z8616 Personal history of COVID-19: Secondary | ICD-10-CM | POA: Diagnosis not present

## 2020-02-10 DIAGNOSIS — Z20822 Contact with and (suspected) exposure to covid-19: Secondary | ICD-10-CM | POA: Diagnosis not present

## 2020-02-11 DIAGNOSIS — Z20822 Contact with and (suspected) exposure to covid-19: Secondary | ICD-10-CM | POA: Diagnosis not present

## 2020-02-13 DIAGNOSIS — R69 Illness, unspecified: Secondary | ICD-10-CM | POA: Diagnosis not present

## 2020-03-08 DIAGNOSIS — M545 Low back pain, unspecified: Secondary | ICD-10-CM | POA: Diagnosis not present

## 2020-04-02 DIAGNOSIS — Z20822 Contact with and (suspected) exposure to covid-19: Secondary | ICD-10-CM | POA: Diagnosis not present

## 2020-04-06 DIAGNOSIS — Z20822 Contact with and (suspected) exposure to covid-19: Secondary | ICD-10-CM | POA: Diagnosis not present

## 2020-04-06 DIAGNOSIS — K219 Gastro-esophageal reflux disease without esophagitis: Secondary | ICD-10-CM | POA: Diagnosis not present

## 2020-04-06 DIAGNOSIS — E119 Type 2 diabetes mellitus without complications: Secondary | ICD-10-CM | POA: Diagnosis not present

## 2020-04-06 DIAGNOSIS — Z8249 Family history of ischemic heart disease and other diseases of the circulatory system: Secondary | ICD-10-CM | POA: Diagnosis not present

## 2020-04-06 DIAGNOSIS — I10 Essential (primary) hypertension: Secondary | ICD-10-CM | POA: Diagnosis not present

## 2020-04-06 DIAGNOSIS — J309 Allergic rhinitis, unspecified: Secondary | ICD-10-CM | POA: Diagnosis not present

## 2020-04-06 DIAGNOSIS — Z7984 Long term (current) use of oral hypoglycemic drugs: Secondary | ICD-10-CM | POA: Diagnosis not present

## 2020-04-06 DIAGNOSIS — E785 Hyperlipidemia, unspecified: Secondary | ICD-10-CM | POA: Diagnosis not present

## 2020-04-06 DIAGNOSIS — Z87891 Personal history of nicotine dependence: Secondary | ICD-10-CM | POA: Diagnosis not present

## 2020-04-07 DIAGNOSIS — R69 Illness, unspecified: Secondary | ICD-10-CM | POA: Diagnosis not present

## 2020-04-07 DIAGNOSIS — G4730 Sleep apnea, unspecified: Secondary | ICD-10-CM | POA: Diagnosis not present

## 2020-04-07 DIAGNOSIS — J449 Chronic obstructive pulmonary disease, unspecified: Secondary | ICD-10-CM | POA: Diagnosis not present

## 2020-04-14 DIAGNOSIS — G4730 Sleep apnea, unspecified: Secondary | ICD-10-CM | POA: Diagnosis not present

## 2020-04-14 DIAGNOSIS — J449 Chronic obstructive pulmonary disease, unspecified: Secondary | ICD-10-CM | POA: Diagnosis not present

## 2020-04-14 DIAGNOSIS — R69 Illness, unspecified: Secondary | ICD-10-CM | POA: Diagnosis not present

## 2020-05-11 ENCOUNTER — Other Ambulatory Visit: Payer: Self-pay

## 2020-05-11 ENCOUNTER — Encounter: Payer: Self-pay | Admitting: Dermatology

## 2020-05-11 ENCOUNTER — Ambulatory Visit: Payer: BC Managed Care – PPO | Admitting: Dermatology

## 2020-05-11 DIAGNOSIS — L309 Dermatitis, unspecified: Secondary | ICD-10-CM | POA: Diagnosis not present

## 2020-05-11 DIAGNOSIS — E1159 Type 2 diabetes mellitus with other circulatory complications: Secondary | ICD-10-CM | POA: Diagnosis not present

## 2020-05-11 DIAGNOSIS — Z5181 Encounter for therapeutic drug level monitoring: Secondary | ICD-10-CM

## 2020-05-11 DIAGNOSIS — G4700 Insomnia, unspecified: Secondary | ICD-10-CM | POA: Diagnosis not present

## 2020-05-11 MED ORDER — DOXEPIN HCL 10 MG PO CAPS: 10.0000 mg | ORAL_CAPSULE | Freq: Every day | ORAL | 0 refills | Status: DC

## 2020-05-11 NOTE — Progress Notes (Signed)
   Follow-Up Visit   Subjective  [name] is a 64 y.o. female who presents for the following: Skin Problem (Face and body itching also scalp x 2 months, no new pets no change in products, no new medications) and Facial Elastosis.  Itch, rash Location: All over Duration: 4 months Quality:  Associated Signs/Symptoms: Modifying Factors:  Severity:  Timing: Context:   Objective  Well appearing patient in no apparent distress; mood and affect are within normal limits.   A full examination was performed including scalp, head, eyes, ears, nose, lips, neck, chest, axillae, abdomen, back, buttocks, bilateral upper extremities, bilateral lower extremities, hands, feet, fingers, toes, fingernails, and toenails. All findings within normal limits unless otherwise noted below.  Also examined mucosa and lymph nodes.   Assessment & Plan   Annualized itching for the past 74months.  No prescriptions medicine tried.  Two Benadryl's daily for a week produced no improvement the last Benadryl was a couple of weeks ago.  Lives with 11 year old grandson husband who are not itching. Detailed history taken.  Review of systems stable.  No known new exposures to inhalants, foods, medications, contactants.    I, Janalyn Harder, MD, have reviewed all documentation for this visit.  The documentation on 05/11/20 for the exam, diagnosis, procedures, and orders are all accurate and complete.

## 2020-05-12 ENCOUNTER — Other Ambulatory Visit: Payer: Self-pay | Admitting: Dermatology

## 2020-05-12 DIAGNOSIS — L239 Allergic contact dermatitis, unspecified cause: Secondary | ICD-10-CM | POA: Diagnosis not present

## 2020-05-12 DIAGNOSIS — Z5181 Encounter for therapeutic drug level monitoring: Secondary | ICD-10-CM | POA: Diagnosis not present

## 2020-05-12 DIAGNOSIS — R5383 Other fatigue: Secondary | ICD-10-CM | POA: Diagnosis not present

## 2020-05-12 DIAGNOSIS — L309 Dermatitis, unspecified: Secondary | ICD-10-CM | POA: Diagnosis not present

## 2020-05-16 ENCOUNTER — Encounter: Payer: Self-pay | Admitting: Dermatology

## 2020-05-17 LAB — PROTEIN ELECTROPHORESIS, SERUM
Albumin ELP: 4 g/dL (ref 3.8–4.8)
Alpha 1: 0.4 g/dL — ABNORMAL HIGH (ref 0.2–0.3)
Alpha 2: 1 g/dL — ABNORMAL HIGH (ref 0.5–0.9)
Beta 2: 0.5 g/dL (ref 0.2–0.5)
Beta Globulin: 0.5 g/dL (ref 0.4–0.6)
Gamma Globulin: 0.8 g/dL (ref 0.8–1.7)
Total Protein: 7.2 g/dL (ref 6.1–8.1)

## 2020-05-17 LAB — CBC WITH DIFFERENTIAL/PLATELET
Absolute Monocytes: 624 cells/uL (ref 200–950)
Basophils Absolute: 77 cells/uL (ref 0–200)
Basophils Relative: 1 %
Eosinophils Absolute: 200 cells/uL (ref 15–500)
Eosinophils Relative: 2.6 %
HCT: 40 % (ref 35.0–45.0)
Hemoglobin: 13.1 g/dL (ref 11.7–15.5)
Lymphs Abs: 1540 cells/uL (ref 850–3900)
MCH: 26.1 pg — ABNORMAL LOW (ref 27.0–33.0)
MCHC: 32.8 g/dL (ref 32.0–36.0)
MCV: 79.7 fL — ABNORMAL LOW (ref 80.0–100.0)
MPV: 9.2 fL (ref 7.5–12.5)
Monocytes Relative: 8.1 %
Neutro Abs: 5259 cells/uL (ref 1500–7800)
Neutrophils Relative %: 68.3 %
Platelets: 377 10*3/uL (ref 140–400)
RBC: 5.02 10*6/uL (ref 3.80–5.10)
RDW: 13.2 % (ref 11.0–15.0)
Total Lymphocyte: 20 %
WBC: 7.7 10*3/uL (ref 3.8–10.8)

## 2020-05-17 LAB — SEDIMENTATION RATE: Sed Rate: 11 mm/h (ref 0–30)

## 2020-05-17 LAB — GLUCOSE 6 PHOSPHATE DEHYDROGENASE: G-6PDH: 13 U/g Hgb (ref 7.0–20.5)

## 2020-05-17 LAB — TSH: TSH: 3.16 mIU/L (ref 0.40–4.50)

## 2020-05-24 ENCOUNTER — Telehealth: Payer: Self-pay | Admitting: *Deleted

## 2020-05-24 MED ORDER — HALOBETASOL PROPIONATE 0.05 % EX CREA: TOPICAL_CREAM | Freq: Every day | CUTANEOUS | 1 refills | Status: DC

## 2020-05-24 NOTE — Telephone Encounter (Signed)
-----   Message from Lavonna Monarch, MD sent at 05/21/2020  9:00 AM EST ----- You can inform patient her laboratories did not show any evidence of internal disease as the cause for her breaking out.  As we discussed, Dr. Denna Haggard prefers to avoid putting people on prednisone, particularly when the diagnosis is unproven and therefore duration of therapy unknown.  Alternatives could include more topicals (not always convenient or effective), ultraviolet light-based therapy, dapsone, methotrexate, and Dupixent.  Dr. Darene Lamer suggests starting with generic halobetasol cream or ointment applied daily immediately after bathing to her wet skin; avoid use on face and body folds.  Schedule follow-up visit if not already done in 4 weeks.

## 2020-05-24 NOTE — Telephone Encounter (Signed)
Lab results to patient. Made a 4 week follow up Per Dr.Tafeen. Also sent patient in a prescription for halobetasol cream to her pharmacy to try. Patient will call with any problems.

## 2020-05-25 ENCOUNTER — Ambulatory Visit: Payer: Medicare HMO | Admitting: Dermatology

## 2020-05-27 DIAGNOSIS — N390 Urinary tract infection, site not specified: Secondary | ICD-10-CM | POA: Diagnosis not present

## 2020-06-07 ENCOUNTER — Other Ambulatory Visit: Payer: Self-pay | Admitting: Dermatology

## 2020-06-07 DIAGNOSIS — L309 Dermatitis, unspecified: Secondary | ICD-10-CM

## 2020-06-22 ENCOUNTER — Ambulatory Visit: Payer: Medicare HMO | Admitting: Dermatology

## 2020-08-03 DIAGNOSIS — R69 Illness, unspecified: Secondary | ICD-10-CM | POA: Diagnosis not present

## 2020-08-03 DIAGNOSIS — E782 Mixed hyperlipidemia: Secondary | ICD-10-CM | POA: Diagnosis not present

## 2020-08-03 DIAGNOSIS — E1159 Type 2 diabetes mellitus with other circulatory complications: Secondary | ICD-10-CM | POA: Diagnosis not present

## 2020-08-03 DIAGNOSIS — E559 Vitamin D deficiency, unspecified: Secondary | ICD-10-CM | POA: Diagnosis not present

## 2020-08-03 DIAGNOSIS — I10 Essential (primary) hypertension: Secondary | ICD-10-CM | POA: Diagnosis not present

## 2020-08-13 DIAGNOSIS — N3000 Acute cystitis without hematuria: Secondary | ICD-10-CM | POA: Diagnosis not present

## 2020-08-13 DIAGNOSIS — R350 Frequency of micturition: Secondary | ICD-10-CM | POA: Diagnosis not present

## 2020-08-25 DIAGNOSIS — M542 Cervicalgia: Secondary | ICD-10-CM | POA: Diagnosis not present

## 2020-08-25 DIAGNOSIS — M545 Low back pain, unspecified: Secondary | ICD-10-CM | POA: Diagnosis not present

## 2020-10-08 DIAGNOSIS — Z03818 Encounter for observation for suspected exposure to other biological agents ruled out: Secondary | ICD-10-CM | POA: Diagnosis not present

## 2020-10-08 DIAGNOSIS — R0981 Nasal congestion: Secondary | ICD-10-CM | POA: Diagnosis not present

## 2020-10-08 DIAGNOSIS — J029 Acute pharyngitis, unspecified: Secondary | ICD-10-CM | POA: Diagnosis not present

## 2020-10-08 DIAGNOSIS — J069 Acute upper respiratory infection, unspecified: Secondary | ICD-10-CM | POA: Diagnosis not present

## 2020-10-08 DIAGNOSIS — B370 Candidal stomatitis: Secondary | ICD-10-CM | POA: Diagnosis not present

## 2020-10-08 DIAGNOSIS — R059 Cough, unspecified: Secondary | ICD-10-CM | POA: Diagnosis not present

## 2020-12-16 DIAGNOSIS — Z6830 Body mass index (BMI) 30.0-30.9, adult: Secondary | ICD-10-CM | POA: Diagnosis not present

## 2020-12-16 DIAGNOSIS — L570 Actinic keratosis: Secondary | ICD-10-CM | POA: Diagnosis not present

## 2020-12-16 DIAGNOSIS — R21 Rash and other nonspecific skin eruption: Secondary | ICD-10-CM | POA: Diagnosis not present

## 2020-12-16 DIAGNOSIS — L988 Other specified disorders of the skin and subcutaneous tissue: Secondary | ICD-10-CM | POA: Diagnosis not present

## 2021-01-04 ENCOUNTER — Ambulatory Visit: Payer: Medicare HMO | Admitting: Dermatology

## 2021-01-04 ENCOUNTER — Encounter: Payer: Self-pay | Admitting: Dermatology

## 2021-01-04 ENCOUNTER — Other Ambulatory Visit: Payer: Self-pay

## 2021-01-04 DIAGNOSIS — D044 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: Secondary | ICD-10-CM | POA: Diagnosis not present

## 2021-01-04 DIAGNOSIS — L300 Nummular dermatitis: Secondary | ICD-10-CM

## 2021-01-04 DIAGNOSIS — L820 Inflamed seborrheic keratosis: Secondary | ICD-10-CM | POA: Diagnosis not present

## 2021-01-04 DIAGNOSIS — D485 Neoplasm of uncertain behavior of skin: Secondary | ICD-10-CM

## 2021-01-04 DIAGNOSIS — C4492 Squamous cell carcinoma of skin, unspecified: Secondary | ICD-10-CM

## 2021-01-04 HISTORY — DX: Squamous cell carcinoma of skin, unspecified: C44.92

## 2021-01-04 MED ORDER — CLOBETASOL PROPIONATE 0.05 % EX OINT: TOPICAL_OINTMENT | CUTANEOUS | 0 refills | Status: DC

## 2021-01-04 NOTE — Patient Instructions (Signed)

## 2021-01-10 ENCOUNTER — Telehealth: Payer: Self-pay | Admitting: *Deleted

## 2021-01-10 NOTE — Telephone Encounter (Signed)
Path to patient. Made surgery appointment with Dr.Tafeen.  

## 2021-01-10 NOTE — Telephone Encounter (Signed)
-----   Message from Lavonna Monarch, MD sent at 01/07/2021  7:02 PM EDT ----- Schedule surgery with Dr. Darene Lamer

## 2021-01-10 NOTE — Telephone Encounter (Signed)
Left message for patient to call back for pathology results.

## 2021-01-16 ENCOUNTER — Encounter: Payer: Self-pay | Admitting: Dermatology

## 2021-01-16 NOTE — Progress Notes (Signed)
   Follow-Up Visit   Subjective  [name] is a 65 y.o. female who presents for the following: Skin Problem (Bumps on right leg, left abdomen & scalp x 3 weeks- itch & bleed tx- none).  New spots on legs, torso, scalp. Location:  Duration:  Quality:  Associated Signs/Symptoms: Modifying Factors:  Severity:  Timing: Context:   Objective  Well appearing patient in no apparent distress; mood and affect are within normal limits. Left Abdomen (side) - Upper Pink slightly inflamed flattopped textured papule; dermoscopy supports benign keratosis  Left Leg Lower legs: Flat patchy inflammatory crust which could fit either nummular eczema or inflamed keratoses/DSAP/superficial carcinomas  right post crown Pink area and 1.2 cm crust typical of superficial carcinoma     left mid crown 1.1 cm crust       All sun exposed areas plus back examined.  Plus abdomen and legs.   Assessment & Plan    Inflamed seborrheic keratosis Left Abdomen (side) - Upper  Leave if stable  Nummular eczema Left Leg  Topical clobetasol daily after bathing for 1 month; if nonresponsive, consider biopsies.  clobetasol ointment (TEMOVATE) 0.05 % - Left Leg Apply to legs nightly 30 days  Neoplasm of uncertain behavior of skin (2) right post crown  Skin / nail biopsy Type of biopsy: tangential   Informed consent: discussed and consent obtained   Timeout: patient name, date of birth, surgical site, and procedure verified   Anesthesia: the lesion was anesthetized in a standard fashion   Anesthetic:  1% lidocaine w/ epinephrine 1-100,000 local infiltration Instrument used: flexible razor blade   Hemostasis achieved with: aluminum chloride and electrodesiccation   Outcome: patient tolerated procedure well   Post-procedure details: wound care instructions given    Specimen 1 - Surgical pathology Differential Diagnosis: bcc scc  Check Margins: No  left mid crown  Skin / nail  biopsy Type of biopsy: tangential   Informed consent: discussed and consent obtained   Timeout: patient name, date of birth, surgical site, and procedure verified   Anesthesia: the lesion was anesthetized in a standard fashion   Anesthetic:  1% lidocaine w/ epinephrine 1-100,000 local infiltration Instrument used: flexible razor blade   Hemostasis achieved with: ferric subsulfate   Outcome: patient tolerated procedure well   Post-procedure details: sterile dressing applied and wound care instructions given   Dressing type: bandage and petrolatum    Specimen 2 - Surgical pathology Differential Diagnosis: bcc scc  Check Margins: No      I, Lavonna Monarch, MD, have reviewed all documentation for this visit.  The documentation on 01/16/21 for the exam, diagnosis, procedures, and orders are all accurate and complete.

## 2021-02-21 DIAGNOSIS — M542 Cervicalgia: Secondary | ICD-10-CM | POA: Diagnosis not present

## 2021-02-21 DIAGNOSIS — M519 Unspecified thoracic, thoracolumbar and lumbosacral intervertebral disc disorder: Secondary | ICD-10-CM | POA: Diagnosis not present

## 2021-03-10 ENCOUNTER — Encounter: Payer: Self-pay | Admitting: Dermatology

## 2021-03-10 ENCOUNTER — Ambulatory Visit (INDEPENDENT_AMBULATORY_CARE_PROVIDER_SITE_OTHER): Payer: Medicare HMO | Admitting: Dermatology

## 2021-03-10 ENCOUNTER — Other Ambulatory Visit: Payer: Self-pay

## 2021-03-10 DIAGNOSIS — D044 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: Secondary | ICD-10-CM

## 2021-03-10 DIAGNOSIS — C4442 Squamous cell carcinoma of skin of scalp and neck: Secondary | ICD-10-CM

## 2021-03-10 NOTE — Patient Instructions (Signed)

## 2021-03-28 ENCOUNTER — Encounter: Payer: Self-pay | Admitting: Dermatology

## 2021-03-28 NOTE — Progress Notes (Signed)
   Follow-Up Visit   Subjective  [name] is a 65 y.o. female who presents for the following: Procedure (Treatment of right post crown and let mid crown. CIS. ).  Skin cancer scalp Location:  Duration:  Quality:  Associated Signs/Symptoms: Modifying Factors:  Severity:  Timing: Context:   Objective  Well appearing patient in no apparent distress; mood and affect are within normal limits. Left Mid Crown Biopsy site identified by nurse and me.  Although histologically this is in situ, it is quite hypertrophic and moderately extensive.  I explained preoperatively the likelihood of local recurrence along with the option of Mohs surgery as a possible plan B.    A focused examination was performed including head and neck.. Relevant physical exam findings are noted in the Assessment and Plan.   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  Destruction of lesion Complexity: simple   Destruction method: electrodesiccation and curettage   Informed consent: discussed and consent obtained   Timeout:  patient name, date of birth, surgical site, and procedure verified Anesthesia: the lesion was anesthetized in a standard fashion   Anesthetic:  1% lidocaine w/ epinephrine 1-100,000 local infiltration Curettage performed in three different directions: Yes   Curettage cycles:  3 Lesion length (cm):  3.1 Lesion width (cm):  3.1 Margin per side (cm):  0 Final wound size (cm):  3.1 Hemostasis achieved with:  ferric subsulfate Outcome: patient tolerated procedure well with no complications   Additional details:  Wound innoculated with 5 fluorouracil solution.  Curettage showed this to be quite wide so only 1 lesion will be treated today.  We will likely discuss use of topical Aldara after initial healing.      I, Lavonna Monarch, MD, have reviewed all documentation for this visit.  The documentation on 03/28/21 for the exam, diagnosis,  procedures, and orders are all accurate and complete.

## 2021-05-12 ENCOUNTER — Encounter: Payer: Self-pay | Admitting: Dermatology

## 2021-05-12 ENCOUNTER — Other Ambulatory Visit: Payer: Self-pay

## 2021-05-12 ENCOUNTER — Ambulatory Visit (INDEPENDENT_AMBULATORY_CARE_PROVIDER_SITE_OTHER): Payer: Medicare HMO | Admitting: Dermatology

## 2021-05-12 DIAGNOSIS — D044 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: Secondary | ICD-10-CM

## 2021-05-12 DIAGNOSIS — D485 Neoplasm of uncertain behavior of skin: Secondary | ICD-10-CM

## 2021-05-12 DIAGNOSIS — L570 Actinic keratosis: Secondary | ICD-10-CM | POA: Diagnosis not present

## 2021-05-12 DIAGNOSIS — D099 Carcinoma in situ, unspecified: Secondary | ICD-10-CM

## 2021-05-12 NOTE — Patient Instructions (Signed)

## 2021-05-18 ENCOUNTER — Telehealth: Payer: Self-pay

## 2021-05-18 NOTE — Telephone Encounter (Signed)
Left message for patient to call the office and she already has a follow up visit for febuary

## 2021-05-18 NOTE — Telephone Encounter (Signed)
-----   Message from Lavonna Monarch, MD sent at 05/18/2021  7:55 AM EST ----- Although treatment was done at the time of the biopsy, she has multiple other scalp lesions and she should already be scheduled for a routine follow-up.

## 2021-05-19 ENCOUNTER — Telehealth: Payer: Self-pay

## 2021-05-19 NOTE — Telephone Encounter (Signed)
-----   Message from Catherine Monarch, MD sent at 05/18/2021  7:55 AM EST ----- Although treatment was done at the time of the biopsy, she has multiple other scalp lesions and she should already be scheduled for a routine follow-up.

## 2021-05-19 NOTE — Telephone Encounter (Signed)
Path to patient and she has a follow up

## 2021-06-07 DIAGNOSIS — Z961 Presence of intraocular lens: Secondary | ICD-10-CM | POA: Diagnosis not present

## 2021-06-07 DIAGNOSIS — H0102A Squamous blepharitis right eye, upper and lower eyelids: Secondary | ICD-10-CM | POA: Diagnosis not present

## 2021-06-07 DIAGNOSIS — H0102B Squamous blepharitis left eye, upper and lower eyelids: Secondary | ICD-10-CM | POA: Diagnosis not present

## 2021-06-07 DIAGNOSIS — H5501 Congenital nystagmus: Secondary | ICD-10-CM | POA: Diagnosis not present

## 2021-06-07 DIAGNOSIS — H53022 Refractive amblyopia, left eye: Secondary | ICD-10-CM | POA: Diagnosis not present

## 2021-06-13 ENCOUNTER — Encounter: Payer: Self-pay | Admitting: Dermatology

## 2021-06-13 NOTE — Progress Notes (Signed)
Follow-Up Visit   Subjective  [name] is a 66 y.o. female who presents for the following: Procedure (Here for treatment right post crown- cis x 1).  Biopsy-proven CIS scalp plus multiple other growing crusts on scalp Location:  Duration:  Quality:  Associated Signs/Symptoms: Modifying Factors:  Severity:  Timing: Context:   Objective  Well appearing patient in no apparent distress; mood and affect are within normal limits. RIGHT POST CROWN Lesion identified by Dr.Conlin Brahm and nurse in room.    RIGHT POST SCALP 1.2 cm waxy crust     POSTERIOR SCALP 1.4 cm waxy crust       A focused examination was performed including head and neck. Relevant physical exam findings are noted in the Assessment and Plan.   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  Destruction of lesion Complexity: simple   Destruction method: electrodesiccation and curettage   Informed consent: discussed and consent obtained   Timeout:  patient name, date of birth, surgical site, and procedure verified Anesthesia: the lesion was anesthetized in a standard fashion   Anesthetic:  1% lidocaine w/ epinephrine 1-100,000 local infiltration Curettage performed in three different directions: Yes   Curettage cycles:  3 Lesion length (cm):  1.5 Lesion width (cm):  1.5 Margin per side (cm):  0 Final wound size (cm):  1.5 Hemostasis achieved with:  ferric subsulfate Outcome: patient tolerated procedure well with no complications   Post-procedure details: sterile dressing applied and wound care instructions given   Dressing type: bandage and petrolatum   Additional details:  Wound inoculated with 5% Fluorouracil.    Neoplasm of uncertain behavior of skin RIGHT POST SCALP  Skin / nail biopsy Type of biopsy: tangential   Informed consent: discussed and consent obtained   Timeout: patient name, date of birth, surgical site, and procedure verified    Anesthesia: the lesion was anesthetized in a standard fashion   Anesthetic:  1% lidocaine w/ epinephrine 1-100,000 local infiltration Instrument used: flexible razor blade   Hemostasis achieved with: aluminum chloride and electrodesiccation   Outcome: patient tolerated procedure well   Post-procedure details: wound care instructions given    Destruction of lesion Complexity: simple   Destruction method: electrodesiccation and curettage   Informed consent: discussed and consent obtained   Timeout:  patient name, date of birth, surgical site, and procedure verified Anesthesia: the lesion was anesthetized in a standard fashion   Anesthetic:  1% lidocaine w/ epinephrine 1-100,000 local infiltration Curettage performed in three different directions: Yes   Curettage cycles:  3 Lesion length (cm):  2 Lesion width (cm):  2 Margin per side (cm):  0 Final wound size (cm):  2 Hemostasis achieved with:  aluminum chloride Outcome: patient tolerated procedure well with no complications   Post-procedure details: wound care instructions given    Specimen 2 - Surgical pathology Differential Diagnosis: SCC VS BCC TXPBX Check Margins: No  After shave biopsy the base of the lesion was treated with curettage plus cautery  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 POSTERIOR SCALP  Skin / nail biopsy Type of biopsy: tangential   Informed consent: discussed and consent obtained   Timeout: patient name, date of birth, surgical site, and procedure verified   Anesthesia: the lesion was anesthetized in a standard fashion   Anesthetic:  1% lidocaine w/ epinephrine 1-100,000 local infiltration Instrument used: flexible razor blade   Hemostasis achieved with: aluminum chloride and electrodesiccation  Outcome: patient tolerated procedure well   Post-procedure details: wound care instructions given    Destruction of lesion Complexity: simple   Destruction method: electrodesiccation and curettage    Informed consent: discussed and consent obtained   Timeout:  patient name, date of birth, surgical site, and procedure verified Anesthesia: the lesion was anesthetized in a standard fashion   Anesthetic:  1% lidocaine w/ epinephrine 1-100,000 local infiltration Curettage cycles:  3 Lesion length (cm):  2 Lesion width (cm):  2 Margin per side (cm):  0 Final wound size (cm):  2 Hemostasis achieved with:  ferric subsulfate Outcome: patient tolerated procedure well with no complications   Post-procedure details: sterile dressing applied and wound care instructions given   Dressing type: bandage and petrolatum    Specimen 1 - Surgical pathology Differential Diagnosis: SCC VS BCC TXPBX Check Margins: No  After shave biopsy the base of the lesion was treated with curettage plus cautery      I, Lavonna Monarch, MD, have reviewed all documentation for this visit.  The documentation on 06/13/21 for the exam, diagnosis, procedures, and orders are all accurate and complete.

## 2021-07-07 ENCOUNTER — Other Ambulatory Visit: Payer: Self-pay

## 2021-07-07 ENCOUNTER — Ambulatory Visit: Payer: Medicare HMO | Admitting: Dermatology

## 2021-07-07 DIAGNOSIS — Z86007 Personal history of in-situ neoplasm of skin: Secondary | ICD-10-CM

## 2021-07-20 DIAGNOSIS — M549 Dorsalgia, unspecified: Secondary | ICD-10-CM | POA: Diagnosis not present

## 2021-07-21 ENCOUNTER — Encounter: Payer: Self-pay | Admitting: Dermatology

## 2021-07-21 NOTE — Progress Notes (Signed)
° °  Follow-Up Visit   Subjective  [name] is a 66 y.o. female who presents for the following: Follow-up (Follow up on treatment of scalp CIS. Patient says they are doing ok. ).  Recheck scalp carcinoma in situ Location:  Duration:  Quality:  Associated Signs/Symptoms: Modifying Factors:  Severity:  Timing: Context:   Objective  Well appearing patient in no apparent distress; mood and affect are within normal limits. Scalp I am pleased to see that there is no obvious residual crusting on patient's scalp,.  This lesion was large and multifocal so future recurrence would be common, but for now I will defer doing any adjunct of therapy like topical Aldara.    A focused examination was performed including head and neck. Relevant physical exam findings are noted in the Assessment and Plan.   Assessment & Plan    History of squamous cell carcinoma in situ Scalp  Recheck in 1 year.      I, Lavonna Monarch, MD, have reviewed all documentation for this visit.  The documentation on 07/21/21 for the exam, diagnosis, procedures, and orders are all accurate and complete.

## 2021-08-03 DIAGNOSIS — M810 Age-related osteoporosis without current pathological fracture: Secondary | ICD-10-CM | POA: Diagnosis not present

## 2021-08-03 DIAGNOSIS — E7800 Pure hypercholesterolemia, unspecified: Secondary | ICD-10-CM | POA: Diagnosis not present

## 2021-08-03 DIAGNOSIS — K219 Gastro-esophageal reflux disease without esophagitis: Secondary | ICD-10-CM | POA: Diagnosis not present

## 2021-08-03 DIAGNOSIS — Z0000 Encounter for general adult medical examination without abnormal findings: Secondary | ICD-10-CM | POA: Diagnosis not present

## 2021-08-03 DIAGNOSIS — G4700 Insomnia, unspecified: Secondary | ICD-10-CM | POA: Diagnosis not present

## 2021-08-03 DIAGNOSIS — R69 Illness, unspecified: Secondary | ICD-10-CM | POA: Diagnosis not present

## 2021-08-03 DIAGNOSIS — I10 Essential (primary) hypertension: Secondary | ICD-10-CM | POA: Diagnosis not present

## 2021-08-03 DIAGNOSIS — Z23 Encounter for immunization: Secondary | ICD-10-CM | POA: Diagnosis not present

## 2021-08-03 DIAGNOSIS — E1159 Type 2 diabetes mellitus with other circulatory complications: Secondary | ICD-10-CM | POA: Diagnosis not present

## 2021-08-04 ENCOUNTER — Other Ambulatory Visit: Payer: Self-pay | Admitting: Family Medicine

## 2021-08-04 DIAGNOSIS — Z1231 Encounter for screening mammogram for malignant neoplasm of breast: Secondary | ICD-10-CM

## 2021-08-04 DIAGNOSIS — M810 Age-related osteoporosis without current pathological fracture: Secondary | ICD-10-CM

## 2021-08-08 ENCOUNTER — Inpatient Hospital Stay (HOSPITAL_BASED_OUTPATIENT_CLINIC_OR_DEPARTMENT_OTHER)
Admission: EM | Admit: 2021-08-08 | Discharge: 2021-08-10 | DRG: 690 | Disposition: A | Discharge status: Home / Self Care | Payer: Medicare HMO | Attending: Family Medicine | Admitting: Family Medicine

## 2021-08-08 ENCOUNTER — Emergency Department (HOSPITAL_BASED_OUTPATIENT_CLINIC_OR_DEPARTMENT_OTHER): Payer: Medicare HMO

## 2021-08-08 ENCOUNTER — Encounter (HOSPITAL_BASED_OUTPATIENT_CLINIC_OR_DEPARTMENT_OTHER): Payer: Self-pay | Admitting: Emergency Medicine

## 2021-08-08 ENCOUNTER — Other Ambulatory Visit: Payer: Self-pay

## 2021-08-08 DIAGNOSIS — R109 Unspecified abdominal pain: Secondary | ICD-10-CM | POA: Diagnosis not present

## 2021-08-08 DIAGNOSIS — G8929 Other chronic pain: Secondary | ICD-10-CM | POA: Diagnosis present

## 2021-08-08 DIAGNOSIS — N10 Acute tubulo-interstitial nephritis: Principal | ICD-10-CM | POA: Diagnosis present

## 2021-08-08 DIAGNOSIS — I10 Essential (primary) hypertension: Secondary | ICD-10-CM | POA: Diagnosis not present

## 2021-08-08 DIAGNOSIS — R001 Bradycardia, unspecified: Secondary | ICD-10-CM | POA: Diagnosis present

## 2021-08-08 DIAGNOSIS — K219 Gastro-esophageal reflux disease without esophagitis: Secondary | ICD-10-CM | POA: Diagnosis present

## 2021-08-08 DIAGNOSIS — Z79899 Other long term (current) drug therapy: Secondary | ICD-10-CM

## 2021-08-08 DIAGNOSIS — Z8249 Family history of ischemic heart disease and other diseases of the circulatory system: Secondary | ICD-10-CM

## 2021-08-08 DIAGNOSIS — R531 Weakness: Secondary | ICD-10-CM

## 2021-08-08 DIAGNOSIS — R69 Illness, unspecified: Secondary | ICD-10-CM | POA: Diagnosis not present

## 2021-08-08 DIAGNOSIS — Z9049 Acquired absence of other specified parts of digestive tract: Secondary | ICD-10-CM | POA: Diagnosis not present

## 2021-08-08 DIAGNOSIS — Z7984 Long term (current) use of oral hypoglycemic drugs: Secondary | ICD-10-CM

## 2021-08-08 DIAGNOSIS — Z20822 Contact with and (suspected) exposure to covid-19: Secondary | ICD-10-CM | POA: Diagnosis present

## 2021-08-08 DIAGNOSIS — F32A Depression, unspecified: Secondary | ICD-10-CM | POA: Diagnosis not present

## 2021-08-08 DIAGNOSIS — Z87891 Personal history of nicotine dependence: Secondary | ICD-10-CM

## 2021-08-08 DIAGNOSIS — Z8371 Family history of colonic polyps: Secondary | ICD-10-CM

## 2021-08-08 DIAGNOSIS — E861 Hypovolemia: Secondary | ICD-10-CM | POA: Diagnosis present

## 2021-08-08 DIAGNOSIS — Z7982 Long term (current) use of aspirin: Secondary | ICD-10-CM

## 2021-08-08 DIAGNOSIS — Z885 Allergy status to narcotic agent status: Secondary | ICD-10-CM | POA: Diagnosis not present

## 2021-08-08 DIAGNOSIS — I951 Orthostatic hypotension: Secondary | ICD-10-CM | POA: Diagnosis present

## 2021-08-08 DIAGNOSIS — N179 Acute kidney failure, unspecified: Secondary | ICD-10-CM | POA: Diagnosis not present

## 2021-08-08 DIAGNOSIS — E872 Acidosis, unspecified: Secondary | ICD-10-CM | POA: Diagnosis not present

## 2021-08-08 DIAGNOSIS — E7800 Pure hypercholesterolemia, unspecified: Secondary | ICD-10-CM | POA: Diagnosis present

## 2021-08-08 DIAGNOSIS — R1030 Lower abdominal pain, unspecified: Secondary | ICD-10-CM | POA: Diagnosis not present

## 2021-08-08 DIAGNOSIS — E876 Hypokalemia: Secondary | ICD-10-CM | POA: Diagnosis not present

## 2021-08-08 DIAGNOSIS — I700 Atherosclerosis of aorta: Secondary | ICD-10-CM | POA: Diagnosis not present

## 2021-08-08 DIAGNOSIS — E119 Type 2 diabetes mellitus without complications: Secondary | ICD-10-CM | POA: Diagnosis not present

## 2021-08-08 DIAGNOSIS — R42 Dizziness and giddiness: Secondary | ICD-10-CM | POA: Diagnosis not present

## 2021-08-08 DIAGNOSIS — E785 Hyperlipidemia, unspecified: Secondary | ICD-10-CM | POA: Diagnosis present

## 2021-08-08 DIAGNOSIS — E871 Hypo-osmolality and hyponatremia: Secondary | ICD-10-CM | POA: Diagnosis present

## 2021-08-08 DIAGNOSIS — Z85828 Personal history of other malignant neoplasm of skin: Secondary | ICD-10-CM

## 2021-08-08 LAB — BASIC METABOLIC PANEL
Anion gap: 12 (ref 5–15)
BUN: 14 mg/dL (ref 8–23)
CO2: 27 mmol/L (ref 22–32)
Calcium: 10.3 mg/dL (ref 8.9–10.3)
Chloride: 91 mmol/L — ABNORMAL LOW (ref 98–111)
Creatinine, Ser: 2.03 mg/dL — ABNORMAL HIGH (ref 0.44–1.00)
GFR, Estimated: 27 mL/min — ABNORMAL LOW (ref >60)
Glucose, Bld: 111 mg/dL — ABNORMAL HIGH (ref 70–99)
Potassium: 3 mmol/L — ABNORMAL LOW (ref 3.5–5.1)
Sodium: 130 mmol/L — ABNORMAL LOW (ref 135–145)

## 2021-08-08 LAB — URINALYSIS, ROUTINE W REFLEX MICROSCOPIC
Bilirubin Urine: NEGATIVE
Glucose, UA: NEGATIVE mg/dL
Hgb urine dipstick: NEGATIVE
Ketones, ur: NEGATIVE mg/dL
Leukocytes,Ua: NEGATIVE
Nitrite: NEGATIVE
Protein, ur: NEGATIVE mg/dL
Specific Gravity, Urine: 1.006 (ref 1.005–1.030)
pH: 5.5 (ref 5.0–8.0)

## 2021-08-08 LAB — CBC
HCT: 39.4 % (ref 36.0–46.0)
Hemoglobin: 13.4 g/dL (ref 12.0–15.0)
MCH: 25.6 pg — ABNORMAL LOW (ref 26.0–34.0)
MCHC: 34 g/dL (ref 30.0–36.0)
MCV: 75.3 fL — ABNORMAL LOW (ref 80.0–100.0)
Platelets: 396 10*3/uL (ref 150–400)
RBC: 5.23 MIL/uL — ABNORMAL HIGH (ref 3.87–5.11)
RDW: 13 % (ref 11.5–15.5)
WBC: 14.6 10*3/uL — ABNORMAL HIGH (ref 4.0–10.5)
nRBC: 0 % (ref 0.0–0.2)

## 2021-08-08 LAB — HEPATIC FUNCTION PANEL
ALT: 10 U/L (ref 0–44)
AST: 16 U/L (ref 15–41)
Albumin: 4.7 g/dL (ref 3.5–5.0)
Alkaline Phosphatase: 96 U/L (ref 38–126)
Bilirubin, Direct: 0.1 mg/dL (ref 0.0–0.2)
Indirect Bilirubin: 0.4 mg/dL (ref 0.3–0.9)
Total Bilirubin: 0.5 mg/dL (ref 0.3–1.2)
Total Protein: 8.1 g/dL (ref 6.5–8.1)

## 2021-08-08 LAB — TROPONIN I (HIGH SENSITIVITY)
Troponin I (High Sensitivity): 3 ng/L (ref <18)
Troponin I (High Sensitivity): 2 ng/L (ref <18)

## 2021-08-08 LAB — RESP PANEL BY RT-PCR (FLU A&B, COVID) ARPGX2
Influenza A by PCR: NEGATIVE
Influenza B by PCR: NEGATIVE
SARS Coronavirus 2 by RT PCR: NEGATIVE

## 2021-08-08 LAB — GLUCOSE, CAPILLARY: Glucose-Capillary: 130 mg/dL — ABNORMAL HIGH (ref 70–99)

## 2021-08-08 LAB — LIPASE, BLOOD: Lipase: 10 U/L — ABNORMAL LOW (ref 11–51)

## 2021-08-08 MED ORDER — POTASSIUM CHLORIDE CRYS ER 20 MEQ PO TBCR: 40.0000 meq | EXTENDED_RELEASE_TABLET | Freq: Two times a day (BID) | ORAL | Status: DC

## 2021-08-08 MED ORDER — ONDANSETRON HCL 4 MG/2ML IJ SOLN
4.0000 mg | Freq: Four times a day (QID) | INTRAMUSCULAR | Status: DC | PRN
  Administered 2021-08-08: 4 mg via INTRAVENOUS
  Filled 2021-08-08: qty 2

## 2021-08-08 MED ORDER — SODIUM CHLORIDE 0.9 % IV BOLUS
1000.0000 mL | Freq: Once | INTRAVENOUS | Status: AC
  Administered 2021-08-08: 1000 mL via INTRAVENOUS

## 2021-08-08 MED ORDER — ACETAMINOPHEN 650 MG RE SUPP: 650.0000 mg | Freq: Four times a day (QID) | RECTAL | Status: DC | PRN

## 2021-08-08 MED ORDER — SODIUM CHLORIDE 0.9 % IV SOLN
1.0000 g | Freq: Once | INTRAVENOUS | Status: AC
  Administered 2021-08-08: 1 g via INTRAVENOUS
  Filled 2021-08-08: qty 10

## 2021-08-08 MED ORDER — POTASSIUM CHLORIDE CRYS ER 20 MEQ PO TBCR
40.0000 meq | EXTENDED_RELEASE_TABLET | Freq: Two times a day (BID) | ORAL | Status: DC
  Administered 2021-08-08: 40 meq via ORAL
  Filled 2021-08-08 (×2): qty 2

## 2021-08-08 MED ORDER — ACETAMINOPHEN 325 MG PO TABS
650.0000 mg | ORAL_TABLET | Freq: Four times a day (QID) | ORAL | Status: DC | PRN
  Administered 2021-08-09: 650 mg via ORAL
  Filled 2021-08-08: qty 2

## 2021-08-08 MED ORDER — IOHEXOL 9 MG/ML PO SOLN
500.0000 mL | ORAL | Status: AC
  Administered 2021-08-08 (×2): 500 mL via ORAL

## 2021-08-08 MED ORDER — SODIUM CHLORIDE 0.9 % IV SOLN: INTRAVENOUS | Status: DC

## 2021-08-08 MED ORDER — ONDANSETRON HCL 4 MG/2ML IJ SOLN
4.0000 mg | Freq: Once | INTRAMUSCULAR | Status: AC
  Administered 2021-08-08: 4 mg via INTRAVENOUS
  Filled 2021-08-08: qty 2

## 2021-08-08 MED ORDER — MELATONIN 3 MG PO TABS
3.0000 mg | ORAL_TABLET | Freq: Every evening | ORAL | Status: DC | PRN
  Administered 2021-08-09 (×2): 3 mg via ORAL
  Filled 2021-08-08 (×2): qty 1

## 2021-08-08 NOTE — Progress Notes (Signed)
Patient is a 66 year old female with history of hypertension, diabetes, hyperlipidemia who presented to the med center at [facility] with complaints of dizziness, lightheadedness, weakness.  On Presentation, she was hemodynamically stable.  Lab work showed sodium of 130, potassium of 3, creatinine of 2( no h/o of CKD), WBC count of 14.6.  Patient noted to be dizzy while standing.  No history of nausea or vomiting but complains of lower abdominal pain.  CT abdomen/pelvis showed mild bilateral perinephric stranding, no hydronephrosis or renal obstruction.  Patient being admitted to for possible pyelonephritis, AKI, hyperkalemia.  Discussed with ED provider, recommended starting on IV fluids, give a dose of ceftriaxone 2 g, send urine culture and blood culture. ?

## 2021-08-08 NOTE — ED Provider Notes (Signed)
?[facility] ?Provider Note ? ? ?CSN: [id number] ?Arrival date & time: 08/08/21  0945 ? ?  ? ?History ?PMH: HTN, HLD, GERD ?Chief Complaint  ?Patient presents with  ? Dizziness  ? ? ?[name] is a 66 y.o. female. ?Chief complaint dizziness.  She says ever since yesterday she has been having these intermittent spells of dizziness.  Usually when they occur she is just sitting down and then suddenly happen when she leans forward or stands up.  When the dizzy episodes happen she has an associated dull headache.  This usually resolves with she sits back and rests for a minute as the dizziness also dissipates.  She does have a history of vertigo, however the symptoms are not like her vertiginous spells.  She also states that about a week ago she started having some lower abdominal pain that moves and comes and goes.  She states this pain wraps around her back.  She denies any trauma to her back but she does have some chronic back pain at baseline.  She has not had a bowel movement in over a week.  Says she is passing some gas, but has not had a full bowel movement.  This is not typical for her.  She is not having any vomiting, but she is very nauseous she denies any decrease in p.o. intake, however does state that she does not keep yourself very well-hydrated baseline.  She denies any new changes in medications, but she does state that she has been on ibuprofen 800 mg twice a day for several years now.  She is also on metformin and an ACEi.  States she has never had a diagnosis of CKD or any acute kidney injury.  Patient does have a history of cholecystectomy and appendectomy.  She denies any abnormal vaginal bleeding, fevers, chills, dysuria, hematuria, diarrhea chest pain, shortness of breath, gait abnormalities numbness, weakness, speech changes, vision changes. ? ? ?Dizziness ?Associated symptoms: headaches and nausea   ?Associated symptoms: no chest pain, no shortness of breath  and no weakness   ? ?  ? ?Home Medications ?Prior to Admission medications   ?Medication Sig Start Date End Date Taking? Authorizing Provider  ?aspirin EC 325 MG EC tablet Take 1 tablet (325 mg total) by mouth daily. 01/11/17   Geradine Girt, DO  ?clobetasol ointment (TEMOVATE) 0.05 % Apply to legs nightly 30 days 01/04/21   Lavonna Monarch, MD  ?clotrimazole (LOTRIMIN) 1 % cream clotrimazole 1 % topical cream    [provider]  ?dexlansoprazole (DEXILANT) 60 MG capsule Take 60 mg by mouth daily.    [provider]  ?doxepin (SINEQUAN) 10 MG capsule TAKE ONE CAPSULE BY MOUTH AT BEDTIME 06/07/20   Lavonna Monarch, MD  ?empagliflozin (JARDIANCE) 10 MG TABS tablet Jardiance 10 mg tablet    [provider]  ?famotidine (PEPCID) 20 MG tablet famotidine 20 mg tablet ? TAKE ONE TABLET BY MOUTH THREE TIMES A DAY WITH IBUPROFEN    [provider]  ?fluticasone (FLONASE) 50 MCG/ACT nasal spray Place 2 sprays into both nostrils daily.    [provider]  ?halobetasol (ULTRAVATE) 0.05 % cream Apply topically daily. After bathing to wet skin. 05/24/20   Lavonna Monarch, MD  ?hydrochlorothiazide (HYDRODIURIL) 25 MG tablet hydrochlorothiazide 25 mg tablet    [provider]  ?ibuprofen (ADVIL) 800 MG tablet ibuprofen 800 mg tablet ? TAKE ONE TABLET BY MOUTH THREE TIMES A DAY AS NEEDED WITH FOOD  [provider]  ?Ibuprofen-Famotidine 800-26.6 MG TABS Duexis 800 mg-26.6 mg tablet ? Take 1 tablet 3 times a day by oral route.    [provider]  ?losartan (COZAAR) 100 MG tablet losartan 100 mg tablet    [provider]  ?losartan (COZAAR) 50 MG tablet Take 100 mg by mouth every evening.    [provider]  ?metFORMIN (GLUCOPHAGE) 500 MG tablet metformin 500 mg tablet    [provider]  ?metoprolol tartrate (LOPRESSOR) 25 MG tablet Take 0.5 tablets (12.5 mg total) by mouth 2 (two) times daily. ?Patient taking differently: Take 12.5-25 mg  by mouth See admin instructions. 25 mg in the morning 12.5 mg in the evening 04/20/16   End, Harrell Gave, MD  ?nystatin (MYCOSTATIN) 100000 UNIT/ML suspension nystatin 100,000 unit/mL oral suspension    [provider]  ?pantoprazole (PROTONIX) 40 MG tablet pantoprazole 40 mg tablet,delayed release    [provider]  ?rosuvastatin (CRESTOR) 20 MG tablet Take 1 tablet (20 mg total) by mouth every evening. 01/10/17   Geradine Girt, DO  ?sertraline (ZOLOFT) 50 MG tablet sertraline 50 mg tablet    [provider]  ?triazolam (HALCION) 0.25 MG tablet Take 0.25 mg by mouth at bedtime.    [provider]  ?   ? ?Allergies    ?Codeine   ? ?Review of Systems   ?Review of Systems  ?Constitutional:  Negative for chills and fever.  ?Eyes:  Negative for visual disturbance.  ?Respiratory:  Negative for shortness of breath.   ?Cardiovascular:  Negative for chest pain.  ?Gastrointestinal:  Positive for abdominal pain, constipation and nausea.  ?Genitourinary:  Positive for flank pain. Negative for decreased urine volume, dysuria, hematuria and vaginal bleeding.  ?Musculoskeletal:  Positive for back pain.  ?Neurological:  Positive for dizziness and headaches. Negative for syncope, weakness and numbness.  ?All other systems reviewed and are negative. ? ?Physical Exam ?Updated Vital Signs ?BP (!) 140/92 (BP Location: Left Arm)   Pulse 75   Temp 98 ?F (36.7 ?C) (Oral)   Resp 19   Ht '5\' 1"'$  (1.549 m)   Wt 72.3 kg   SpO2 99%   BMI 30.12 kg/m?  ?Physical Exam ?Vitals and nursing note reviewed.  ?Constitutional:   ?   General: She is not in acute distress. ?   Appearance: Normal appearance. She is not ill-appearing, toxic-appearing or diaphoretic.  ?HENT:  ?   Head: Normocephalic and atraumatic.  ?   Nose: No nasal deformity.  ?   Mouth/Throat:  ?   Lips: Pink. No lesions.  ?   Mouth: Mucous membranes are moist. No injury, lacerations, oral lesions or angioedema.  ?   Pharynx: Oropharynx is clear.  Uvula midline. No pharyngeal swelling, oropharyngeal exudate, posterior oropharyngeal erythema or uvula swelling.  ?Eyes:  ?   General: Gaze aligned appropriately. No scleral icterus.    ?   Right eye: No discharge.     ?   Left eye: No discharge.  ?   Conjunctiva/sclera: Conjunctivae normal.  ?   Right eye: Right conjunctiva is not injected. No exudate or hemorrhage. ?   Left eye: Left conjunctiva is not injected. No exudate or hemorrhage. ?   Pupils: Pupils are equal, round, and reactive to light.  ?Cardiovascular:  ?   Rate and Rhythm: Normal rate and regular rhythm.  ?   Pulses: Normal pulses.     ?     Radial pulses are 2+ on the  right side and 2+ on the left side.  ?     Dorsalis pedis pulses are 2+ on the right side and 2+ on the left side.  ?   Heart sounds: Normal heart sounds, S1 normal and S2 normal. Heart sounds not distant. No murmur heard. ?  No friction rub. No gallop. No S3 or S4 sounds.  ?Pulmonary:  ?   Effort: Pulmonary effort is normal. No accessory muscle usage or respiratory distress.  ?   Breath sounds: Normal breath sounds. No stridor. No wheezing, rhonchi or rales.  ?Chest:  ?   Chest wall: No tenderness.  ?Abdominal:  ?   General: Abdomen is flat. Bowel sounds are normal. There is no distension.  ?   Palpations: Abdomen is soft. There is no mass or pulsatile mass.  ?   Tenderness: There is abdominal tenderness. There is right CVA tenderness, left CVA tenderness and guarding. There is no rebound.  ?   Comments: Umbilical and lower abdominal tenderness present. Guarding present.   ?Musculoskeletal:  ?   Right lower leg: No edema.  ?   Left lower leg: No edema.  ?   Comments: No midline tenderness of spine, no stepoff or deformity; no reproducible muscular tenderness in paraspinal muscles ?DP/PT pulses 2+ and equal bilaterally ?No leg edema ?Sensation grossly intact on anterior thighs, dorsum of foot and lateral foot ?Strength of knee flexion and extension is 5/5 ?Plantar and dorsiflexion of  ankle 5/5 ?  ?Skin: ?   General: Skin is warm and dry.  ?   Coloration: Skin is not jaundiced or pale.  ?   Findings: No bruising, erythema, lesion or rash.  ?Neurological:  ?   General: No focal deficit pr

## 2021-08-08 NOTE — ED Triage Notes (Addendum)
Pt reports episode of dizziness and "sweating" lasting about 30 minutes yesterday. Pt reports similar episode 0830 today, pt sat down, denies LOC, symptoms passed on their own, then returned now upon arrival to ED.  No focal deficits noted in triage. Pt reports h/o vertigo. ?

## 2021-08-09 ENCOUNTER — Encounter (HOSPITAL_COMMUNITY): Payer: Self-pay | Admitting: Internal Medicine

## 2021-08-09 DIAGNOSIS — N10 Acute tubulo-interstitial nephritis: Secondary | ICD-10-CM | POA: Diagnosis present

## 2021-08-09 DIAGNOSIS — E876 Hypokalemia: Secondary | ICD-10-CM | POA: Diagnosis present

## 2021-08-09 DIAGNOSIS — N179 Acute kidney failure, unspecified: Secondary | ICD-10-CM | POA: Diagnosis present

## 2021-08-09 DIAGNOSIS — E871 Hypo-osmolality and hyponatremia: Secondary | ICD-10-CM | POA: Diagnosis present

## 2021-08-09 LAB — CBC WITH DIFFERENTIAL/PLATELET
Abs Immature Granulocytes: 0.04 10*3/uL (ref 0.00–0.07)
Basophils Absolute: 0.1 10*3/uL (ref 0.0–0.1)
Basophils Relative: 1 %
Eosinophils Absolute: 0.1 10*3/uL (ref 0.0–0.5)
Eosinophils Relative: 1 %
HCT: 35.1 % — ABNORMAL LOW (ref 36.0–46.0)
Hemoglobin: 11.8 g/dL — ABNORMAL LOW (ref 12.0–15.0)
Immature Granulocytes: 0 %
Lymphocytes Relative: 14 %
Lymphs Abs: 1.3 10*3/uL (ref 0.7–4.0)
MCH: 26.2 pg (ref 26.0–34.0)
MCHC: 33.6 g/dL (ref 30.0–36.0)
MCV: 77.8 fL — ABNORMAL LOW (ref 80.0–100.0)
Monocytes Absolute: 1.1 10*3/uL — ABNORMAL HIGH (ref 0.1–1.0)
Monocytes Relative: 11 %
Neutro Abs: 6.8 10*3/uL (ref 1.7–7.7)
Neutrophils Relative %: 73 %
Platelets: 308 10*3/uL (ref 150–400)
RBC: 4.51 MIL/uL (ref 3.87–5.11)
RDW: 13.2 % (ref 11.5–15.5)
WBC: 9.3 10*3/uL (ref 4.0–10.5)
nRBC: 0 % (ref 0.0–0.2)

## 2021-08-09 LAB — URINE CULTURE: Culture: NO GROWTH

## 2021-08-09 LAB — TSH: TSH: 2.286 u[IU]/mL (ref 0.350–4.500)

## 2021-08-09 LAB — COMPREHENSIVE METABOLIC PANEL
ALT: 12 U/L (ref 0–44)
AST: 14 U/L — ABNORMAL LOW (ref 15–41)
Albumin: 3.4 g/dL — ABNORMAL LOW (ref 3.5–5.0)
Alkaline Phosphatase: 90 U/L (ref 38–126)
Anion gap: 11 (ref 5–15)
BUN: 12 mg/dL (ref 8–23)
CO2: 21 mmol/L — ABNORMAL LOW (ref 22–32)
Calcium: 9.1 mg/dL (ref 8.9–10.3)
Chloride: 104 mmol/L (ref 98–111)
Creatinine, Ser: 1.66 mg/dL — ABNORMAL HIGH (ref 0.44–1.00)
GFR, Estimated: 34 mL/min — ABNORMAL LOW (ref >60)
Glucose, Bld: 114 mg/dL — ABNORMAL HIGH (ref 70–99)
Potassium: 3.5 mmol/L (ref 3.5–5.1)
Sodium: 136 mmol/L (ref 135–145)
Total Bilirubin: 0.7 mg/dL (ref 0.3–1.2)
Total Protein: 6.6 g/dL (ref 6.5–8.1)

## 2021-08-09 LAB — GLUCOSE, CAPILLARY
Glucose-Capillary: 121 mg/dL — ABNORMAL HIGH (ref 70–99)
Glucose-Capillary: 99 mg/dL (ref 70–99)
Glucose-Capillary: 112 mg/dL — ABNORMAL HIGH (ref 70–99)
Glucose-Capillary: 101 mg/dL — ABNORMAL HIGH (ref 70–99)

## 2021-08-09 LAB — HEMOGLOBIN A1C
Hgb A1c MFr Bld: 5.9 % — ABNORMAL HIGH (ref 4.8–5.6)
Mean Plasma Glucose: 122.63 mg/dL

## 2021-08-09 LAB — MAGNESIUM: Magnesium: 1.7 mg/dL (ref 1.7–2.4)

## 2021-08-09 LAB — CK: Total CK: 32 U/L — ABNORMAL LOW (ref 38–234)

## 2021-08-09 MED ORDER — INSULIN ASPART 100 UNIT/ML IJ SOLN
0.0000 [IU] | Freq: Three times a day (TID) | INTRAMUSCULAR | Status: DC
  Administered 2021-08-09: 1 [IU] via SUBCUTANEOUS

## 2021-08-09 MED ORDER — ROSUVASTATIN CALCIUM 20 MG PO TABS
20.0000 mg | ORAL_TABLET | Freq: Every evening | ORAL | Status: DC
  Administered 2021-08-09: 20 mg via ORAL
  Filled 2021-08-09: qty 1

## 2021-08-09 MED ORDER — SODIUM CHLORIDE 0.9 % IV SOLN
1.0000 g | INTRAVENOUS | Status: DC
  Administered 2021-08-09 – 2021-08-10 (×2): 1 g via INTRAVENOUS
  Filled 2021-08-09 (×2): qty 10

## 2021-08-09 MED ORDER — ASPIRIN EC 325 MG PO TBEC
325.0000 mg | DELAYED_RELEASE_TABLET | Freq: Every day | ORAL | Status: DC
  Administered 2021-08-09 – 2021-08-10 (×2): 325 mg via ORAL
  Filled 2021-08-09 (×2): qty 1

## 2021-08-09 MED ORDER — FLUTICASONE PROPIONATE 50 MCG/ACT NA SUSP
2.0000 | Freq: Every day | NASAL | Status: DC
  Administered 2021-08-09 – 2021-08-10 (×2): 2 via NASAL
  Filled 2021-08-09: qty 16

## 2021-08-09 MED ORDER — PANTOPRAZOLE SODIUM 40 MG PO TBEC
40.0000 mg | DELAYED_RELEASE_TABLET | Freq: Every day | ORAL | Status: DC
  Administered 2021-08-09 – 2021-08-10 (×2): 40 mg via ORAL
  Filled 2021-08-09 (×2): qty 1

## 2021-08-09 NOTE — Assessment & Plan Note (Signed)
?  ?  ?#)   Essential Hypertension: documented h/o such, with outpatient antihypertensive regimen including HCTZ, losartan, Lopressor.  SBP's in the ED today: In the 1 teens to 140s mmHg. in the setting of presenting AKI, will hold home losartan for now.  On the basis of presenting hyponatremia, presumed to be acute in nature, will hold home HCTZ for now.  Additionally, his presenting EKG reflects borderline bradycardia, will also hold home Lopressor for now. ?  ?Plan: Close monitoring of subsequent BP via routine VS. hold home antihypertensive medications for now, as above.  Repeat CMP in the morning. ?  ?  ?  ?

## 2021-08-09 NOTE — Assessment & Plan Note (Signed)
?#)   Hyperlipidemia: documented h/o such. On high intensity rosuvastatin as outpatient.    Plan: continue home statin.    

## 2021-08-09 NOTE — Care Management Obs Status (Signed)
MEDICARE OBSERVATION STATUS NOTIFICATION ? ? ?Patient Details  ?Name: [name] [facility] ?MRN: [id number] ?Date of Birth: 18-Sep-1955 ? ? ?Medicare Observation Status Notification Given:  Yes ? ? ? ?Tom-Johnson, Renea Ee, RN ?08/09/2021, 4:23 PM ?

## 2021-08-09 NOTE — Assessment & Plan Note (Signed)
?  ?#)   Depression: Documented history of such, of which the patient is on Zoloft as an outpatient.  In the setting of presenting hyponatremia, presumed to be acute in nature, will hold home Zoloft for now. ?  ?Plan: Hold home Zoloft for now.  Further evaluation management of presenting hyponatremia, as further detailed above. ?  ?

## 2021-08-09 NOTE — H&P (Signed)
?History and Physical  ? ? ?PLEASE NOTE THAT [employer]AGON [employer] SOFTWARE WAS USED IN THE CONSTRUCTION OF THIS NOTE. ? ? ?[name] [facility] BDZ:[id number] DOB: 08/16/1955 DOA: 08/08/2021 ? ?PCP: Catherine Stalker, PA-C  ?Patient coming from: home  ? ?I have personally briefly reviewed patient's old medical records in [place] ? ?Chief Complaint: Generalized weakness ? ?HPI: [name] is a 66 y.o. female with medical history significant for essential hypertension, type 2 diabetes mellitus, hyperlipidemia, who is admitted to [facility] on 08/08/2021 by way of transfer from [facility] emergency department with suspected pyelonephritis after presenting from home to the latter facility complaining of generalized weakness. ? ?The patient reports 4 to 5 days of generalized weakness in the absence of any associated acute focal weakness, acute focal numbness, paresthesias, facial droop, slurred speech, expressive aphasia, acute change in vision, dysphagia, vertigo. She notes that this has been associated with intermittent nausea in the absence of any vomiting but resulting in decline in oral intake of both food and water over that timeframe. Subsequently, over the last 1 to 2 days, she has noted some dizziness when rising from a seated to a standing position, but denies any associated syncope.  Denies any associated chest pain, shortness of breath, palpitations, diaphoresis.  Denies any recent trauma or travel.  No recent calf tenderness or new lower extremity erythema.  No personal history of DVT/PE. No recent cough, rash.  ? ?Over the last 3 to 4 days, she is also noted bilateral lower abdominal discomfort with radiation into the bilateral flanks.  Describes the pain as sharp in nature, intermittent but becoming more frequent over the last few days.  Reports exacerbation with palpation over the lower portion of the abdomen bilaterally. denies any associated acute dysuria, gross hematuria, or  change in urinary urgency/frequency.  No recent diarrhea, melena, or hematochezia.  Denies associated subjective fever, chills, rigors, or generalized myalgias. ? ?Medical history notable for essential pretension, for which she is on Lopressor, losartan, HCTZ.  Additionally, the setting of depression, she is on Zoloft as an outpatient. ? ?Per chart review, most recent prior serum sodium value noted to be 141 in August 2018, while most recent prior serum creatinine value was noted to be 0.85 in August 2018.  ? ? ? ?[facility] ED Course:  ?Vital signs in the ED were notable for the following: Afebrile; heart rate 59-75; blood pressure initially 117/77, subsequently increased to 143/81 following interval administration of IV fluids, as further detailed below; respiratory rate 16-19, oxygen saturation 97 to 100% on room air. ? ?Labs were notable for the following: CMP notable for the following: Sodium 130, potassium 3.0, bicarbonate 27, anion gap 12, creatinine 2.03, glucose 111, liver enzymes within normal limits.  Lipase 10.  High-sensitivity troponin I initially noted to be 3, with repeat value trending down to 2.  CBC notable for white cell count 14,600, hemoglobin 13.4.  Urinalysis without full microscopy was notable for demonstrating no protein.  COVID-19/influenza PCR negative.  Blood cultures x2 as well as urine culture collected prior to initiation of IV antibiotics, as further detailed below. ? ?Imaging and additional notable ED work-up: EKG showed sinus bradycardia with heart rate 59, normal intervals, no evidence of T wave or ST changes, including no evidence of ST elevation.  CT abdomen/pelvis without contrast showed mild bilateral perinephric stranding concerning for pyelonephritis without evidence of renal obstruction nor any evidence of ureteral/renal stone; otherwise, CT abdomen/pelvis showed no evidence of acute intra-abdominal process. ? ?  While in the ED, the following were administered: Normal saline  x1 L bolus followed by initiation of continuous NS at 125 cc/h.  Rocephin, Zofran 4 mg IV x1, potassium chloride 40 mEq p.o. x1 dose. ? ?Subsequently, the patient was transferred to [facility] for overnight observation to [place] unit for further evaluation and management of suspected bilateral pyelonephritis, with presenting labs also notable for acute kidney injury, hypokalemia, acute hyponatremia, after presenting with complaint of generalized weakness.  ? ? ? ?Review of Systems: As per HPI otherwise 10 point review of systems negative.  ? ?Past Medical History:  ?Diagnosis Date  ? Chest discomfort   ? GERD (gastroesophageal reflux disease)   ? High cholesterol   ? Hypertension   ? Joint pain   ? Lichenoid dermatitis   ? DR. Denna Haggard  ? Osteoporosis   ? T SCORE OF -2.7 ON DEXA IN 2013  ? Squamous cell carcinoma of skin 01/04/2021  ? in situ- right post crown ([id number])  ? ? ?Past Surgical History:  ?Procedure Laterality Date  ? APPENDECTOMY    ? CHOLECYSTECTOMY    ? ? ?Social History: ? reports that she quit smoking about 12 years ago. Her smoking use included cigarettes. She has a 87.50 pack-year smoking history. She has never used smokeless tobacco. She reports that she does not drink alcohol and does not use drugs. ? ? ?Allergies  ?Allergen Reactions  ? Codeine Nausea And Vomiting  ? ? ?Family History  ?Problem Relation Age of Onset  ? Microcephaly Father   ? CAD Father   ? Heart disease Sister   ?     atrial fibrillation  ? Heart attack Brother   ?     died 72  ? Colon polyps Mother   ? Hypertension Mother   ? Colon cancer Neg Hx   ? Liver disease Neg Hx   ? ? ?Family history reviewed and not pertinent  ? ? ?Prior to Admission medications   ?Medication Sig Start Date End Date Taking? Authorizing Provider  ?aspirin EC 325 MG EC tablet Take 1 tablet (325 mg total) by mouth daily. 01/11/17   Geradine Girt, DO  ?clobetasol ointment (TEMOVATE) 0.05 % Apply to legs nightly 30 days 01/04/21   Lavonna Monarch, MD   ?clotrimazole (LOTRIMIN) 1 % cream clotrimazole 1 % topical cream    [provider]  ?dexlansoprazole (DEXILANT) 60 MG capsule Take 60 mg by mouth daily.    [provider]  ?doxepin (SINEQUAN) 10 MG capsule TAKE ONE CAPSULE BY MOUTH AT BEDTIME 06/07/20   Lavonna Monarch, MD  ?empagliflozin (JARDIANCE) 10 MG TABS tablet Jardiance 10 mg tablet    [provider]  ?famotidine (PEPCID) 20 MG tablet famotidine 20 mg tablet ? TAKE ONE TABLET BY MOUTH THREE TIMES A DAY WITH IBUPROFEN    [provider]  ?fluticasone (FLONASE) 50 MCG/ACT nasal spray Place 2 sprays into both nostrils daily.    [provider]  ?halobetasol (ULTRAVATE) 0.05 % cream Apply topically daily. After bathing to wet skin. 05/24/20   Lavonna Monarch, MD  ?hydrochlorothiazide (HYDRODIURIL) 25 MG tablet hydrochlorothiazide 25 mg tablet    [provider]  ?ibuprofen (ADVIL) 800 MG tablet ibuprofen 800 mg tablet ? TAKE ONE TABLET BY MOUTH THREE TIMES A DAY AS NEEDED WITH FOOD    [provider]  ?Ibuprofen-Famotidine 800-26.6 MG TABS Duexis 800 mg-26.6 mg tablet ? Take 1 tablet 3 times a day by oral route.  [provider]  ?losartan (COZAAR) 100 MG tablet losartan 100 mg tablet    [provider]  ?losartan (COZAAR) 50 MG tablet Take 100 mg by mouth every evening.    [provider]  ?metFORMIN (GLUCOPHAGE) 500 MG tablet metformin 500 mg tablet    [provider]  ?metoprolol tartrate (LOPRESSOR) 25 MG tablet Take 0.5 tablets (12.5 mg total) by mouth 2 (two) times daily. ?Patient taking differently: Take 12.5-25 mg by mouth See admin instructions. 25 mg in the morning 12.5 mg in the evening 04/20/16   End, Harrell Gave, MD  ?nystatin (MYCOSTATIN) 100000 UNIT/ML suspension nystatin 100,000 unit/mL oral suspension    [provider]  ?pantoprazole (PROTONIX) 40 MG tablet pantoprazole 40 mg tablet,delayed release    [provider]   ?rosuvastatin (CRESTOR) 20 MG tablet Take 1 tablet (20 mg total) by mouth every evening. 01/10/17   Geradine Girt, DO  ?sertraline (ZOLOFT) 50 MG tablet sertraline 50 mg tablet    [provider]  ?triazolam (HA

## 2021-08-09 NOTE — Plan of Care (Signed)
  Problem: Activity: Goal: Risk for activity intolerance will decrease Outcome: Adequate for Discharge   

## 2021-08-09 NOTE — Assessment & Plan Note (Signed)
?  ?#)   Acute kidney injury: Presenting labs reflect elevated creatinine of 2.03, which presumably is associated with an acute kidney injury, although evaluation for such complicated by the absence of any serum creatinine data points over the last 4.5 years, at which time serum creatinine was noted to be 0.85 in August 2018.  Urinalysis without for microscopy showed no evidence of protein.  Suspect prerenal contribution from mild dehydration as a result of recent decline in oral intake in the setting of intermittent nausea.  Additionally, suspect potential pharmacologic exacerbation by way of outpatient losartan as well as HCTZ.  Received 1 L normal saline bolus followed by continuous NS at [facility] ED earlier today. ?  ?Plan: Monitor strict I's and O's and daily weights.  Attempt to avoid nephrotoxic agents.  Hold home losartan as well as HCTZ.  For now, continue existing NS, with consideration for transition to the lactated Ringer's.  Repeat BMP in the morning.  Check CPK level.  Hold home metformin. ?  ?  ?

## 2021-08-09 NOTE — Assessment & Plan Note (Signed)
?  ?#)   Acute hypo-osmolar hypovolemic hyponatremia: Presenting serum sodium [place] 130, which is suspected to be acute in nature, although specific duration of her hyponatremia is now entirely clear, given the absence of serum sodium data points over the last 4.5 years, at which time serum sodium was noted to be 141 in August 2018. Suspect an element of hypovolumeia, with suspected contribution from dehydration in the setting of clinical evidence of such as well as report of recent decline in oral intake.  Potential additional pharmacologic contributions in the form of home HCTZ as well as Zoloft. ?  ?Differential also includes the possibility of a contribution from SIADH. in general, will continue to provide gentle IV fluids to attend to suspected contribution from dehydration, with plan for expansion of work-up for the additional urinalysis, including random urine sent as well as random urine osmolality should interval serum sodium level not improve with IV fluids.  ?  ?Of note, no evidence of associated acute focal neurologic deficits and no report of recent trauma.  ?  ?  ?  ?Plan: monitor strict I's and O's and daily weights.  Repeat CMP in the morning. Check TSH.  Continue gentle IVF's.  Hold home Zoloft and HCTZ for now. ?  ?  ?

## 2021-08-09 NOTE — TOC Initial Note (Signed)
Transition of Care (TOC) - Initial/Assessment Note  ? ? ?Patient Details  ?Name: [name] ?MRN: [id number] ?Date of Birth: 1956-02-19 ? ?Transition of Care (TOC) CM/SW Contact:    ?Tom-Johnson, Renea Ee, RN ?Phone Number: ?08/09/2021, 12:58 PM ? ?Clinical Narrative:                 ? ?Patient is admitted for Acute Pyelonephritis. From home with husband and grandson. Independent with care and drive self prior to admission. Has a cane at home.  ?PCP is Marda Stalker, PA-C and uses [facility] on [address]. No PT f/u noted. Family to transport at discharge. CM will continue to follow with needs. ? ?Expected Discharge Plan: Home/Self Care ?Barriers to Discharge: Continued Medical Work up ? ? ?Patient Goals and CMS Choice ?Patient states their goals for this hospitalization and ongoing recovery are:: To return home ?CMS Medicare.gov Compare Post Acute Care list provided to:: Patient ?Choice offered to / list presented to : NA ? ?Expected Discharge Plan and Services ?Expected Discharge Plan: Home/Self Care ?  ?Discharge Planning Services: CM Consult ?Post Acute Care Choice: NA ?  ?                ?DME Arranged: N/A ?DME Agency: NA ?  ?  ?  ?HH Arranged: NA ?[place] Agency: NA ?  ?  ?  ? ?Prior Living Arrangements/Services ?  ?Lives with:: Spouse, Minor Children (Grandson) ?Patient language and need for interpreter reviewed:: Yes ?Do you feel safe going back to the place where you live?: Yes      ?Need for Family Participation in Patient Care: Yes (Comment) ?Care giver support system in place?: Yes (comment) ?Current home services: DME [facility]ud[facility]) ?Criminal Activity/Legal Involvement Pertinent to Current Situation/Hospitalization: No - Comment as needed ? ?Activities of Daily Living ?Home Assistive Devices/Equipment: None ?ADL Screening (condition at time of admission) ?Patient's cognitive ability adequate to safely complete daily activities?: Yes ?Is the patient deaf or have difficulty hearing?:  No ?Does the patient have difficulty seeing, even when wearing glasses/contacts?: No ?Does the patient have difficulty concentrating, remembering, or making decisions?: No ?Patient able to express need for assistance with ADLs?: Yes ?Does the patient have difficulty dressing or bathing?: No ?Independently performs ADLs?: Yes (appropriate for developmental age) ?Does the patient have difficulty walking or climbing stairs?: No ?Weakness of Legs: None ?Weakness of Arms/Hands: None ? ?Permission Sought/Granted ?Permission sought to share information with : Case Manager, Family Supports ?Permission granted to share information with : Yes, Verbal Permission Granted ?   ?   ?   ?   ? ?Emotional Assessment ?Appearance:: Appears stated age ?Attitude/Demeanor/Rapport: Engaged, Gracious ?  ?Orientation: : Oriented to Self, Oriented to Place, Oriented to  Time, Oriented to Situation ?Alcohol / Substance Use: Not Applicable ?Psych Involvement: No (comment) ? ?Admission diagnosis:  Weakness [R53.1] ?Bilateral flank pain [R10.9] ?AKI (acute kidney injury) ([place]) [N17.9] ?Patient Active Problem List  ? Diagnosis Date Noted  ? Acute pyelonephritis 08/09/2021  ? AKI (acute kidney injury) ([place]) 08/09/2021  ? Hypokalemia 08/09/2021  ? Hyponatremia 08/09/2021  ? Generalized weakness 08/08/2021  ? TIA (transient ischemic attack) 03/03/2017  ? Leukocytosis 01/10/2017  ? Right sided weakness 01/09/2017  ? Chest pain, rule out acute myocardial infarction 01/09/2016  ? CLOSED FRACTURE OF UNSPECIFIED PART OF FIBULA 02/15/2010  ? FACIAL RASH 01/26/2010  ? COUGH 01/26/2010  ? VOMITING 01/26/2010  ? MOTION SICKNESS 12/08/2009  ? CHOLECYSTECTOMY, LAPAROSCOPIC, HX OF 12/08/2009  ? CHOLELITHIASIS  10/20/2009  ? LIVER FUNCTION TESTS, ABNORMAL, HX OF 10/03/2009  ? VIRAL URI 09/23/2009  ? SEBORRHEA 09/23/2009  ? SHORTNESS OF BREATH 09/23/2009  ? INSOMNIA 07/21/2009  ? EDEMA 07/21/2009  ? ACUTE FRONTAL SINUSITIS 06/10/2009  ? WEIGHT GAIN 06/10/2009  ?  ACUTE BRONCHITIS 05/27/2009  ? ACTINIC KERATOSIS 03/29/2009  ? VITAMIN D DEFICIENCY 02/23/2009  ? Depression 02/23/2009  ? ALLERGIC RHINITIS 02/23/2009  ? CONSTIPATION 02/23/2009  ? IRRITABLE BOWEL SYNDROME 02/23/2009  ? BENIGN POSITIONAL VERTIGO, HX OF 02/12/2009  ? HLD (hyperlipidemia) 12/29/2008  ? Essential hypertension 12/29/2008  ? GERD 12/29/2008  ? TOBACCO USE, QUIT 12/29/2008  ? ?PCP:  Marda Stalker, PA-C ?Pharmacy:   ?[facility] [id number] ?[place] ?[place] [address] ?Phone: [phone] Fax: [phone] ? ?[facility] [address] ?[address] ?[place] [address] ?Phone: [phone] Fax: [phone] ? ? ? ? ?Social Determinants of Health (SDOH) Interventions ?  ? ?Readmission Risk Interventions ?   ? View : No data to display.  ?  ?  ?  ? ? ? ?

## 2021-08-09 NOTE — Assessment & Plan Note (Signed)
#)  Bilateral pyelonephritis: Admitted for this suspected diagnosis in the setting of 3 to 4 days of progressive bilateral lower abdominal discomfort with radiation into the bilateral flanks, with CT abdomen/pelvis showing evidence of bilateral perinephric stranding concerning for pyelonephritis in the absence of any evidence for renal obstruction or renal stone, and no evidence of additional acute intra-abdominal/intrapelvic process.  Of note, presentation associated with leukocytosis, but otherwise no additional SIRS criteria met at this time.  Therefore, criteria for sepsis not currently met.  Of note, urinalysis was performed without full microscopy, thereby limiting further evaluation for underlying urinary tract infection.  However, but cultures x2 as well as urine culture were collected prior to initiation of Rocephin.  ?  ?Plan: Continue IV fluids.  Monitor strict I's and O's Daily weights.  Follow for results of blood cultures x2 as well as urine culture.  Continue Rocephin.  Repeat CBC with differential in the morning.  Repeat CMP in the morning.  Prn acetaminophen.  Refraining from NSAIDs given concomitant acute kidney injury. ?  ?  ?

## 2021-08-09 NOTE — Assessment & Plan Note (Signed)
?  ?#)   GERD: documented h/o such; on Dexilant as outpatient.  In the setting of presenting hypokalemia, will hold home PPI for now. ?  ?Plan: Hold home PPI for now.   ?  ?

## 2021-08-09 NOTE — Progress Notes (Signed)
?Progress note ? ? ?[name] [facility] LFY:[id number] DOB: March 15, 1956 DOA: 08/08/2021 ? ?PCP: Marda Stalker, PA-C  ?Patient coming from: home  ? ?I have personally briefly reviewed patient's old medical records in [place] ? ?Chief Complaint: Generalized weakness ? ?HPI: [name] is a 66 y.o. female with medical history significant for essential hypertension, type 2 diabetes mellitus, hyperlipidemia, who is admitted to [facility] on 08/08/2021 by way of transfer from [facility] emergency department with suspected pyelonephritis after presenting from home to the latter facility complaining of generalized weakness. ? ?The patient reports 4 to 5 days of generalized weakness in the absence of any associated acute focal weakness, acute focal numbness, paresthesias, facial droop, slurred speech, expressive aphasia, acute change in vision, dysphagia, vertigo. She notes that this has been associated with intermittent nausea in the absence of any vomiting but resulting in decline in oral intake of both food and water over that timeframe. Subsequently, over the last 1 to 2 days, she has noted some dizziness when rising from a seated to a standing position, but denies any associated syncope.  Denies any associated chest pain, shortness of breath, palpitations, diaphoresis.  Denies any recent trauma or travel.  No recent calf tenderness or new lower extremity erythema.  No personal history of DVT/PE. No recent cough, rash.  ? ?Over the last 3 to 4 days, she is also noted bilateral lower abdominal discomfort with radiation into the bilateral flanks.  Describes the pain as sharp in nature, intermittent but becoming more frequent over the last few days.  Reports exacerbation with palpation over the lower portion of the abdomen bilaterally. denies any associated acute dysuria, gross hematuria, or change in urinary urgency/frequency.  No recent diarrhea, melena, or hematochezia.  Denies associated  subjective fever, chills, rigors, or generalized myalgias. ? ?Medical history notable for essential pretension, for which she is on Lopressor, losartan, HCTZ.  Additionally, the setting of depression, she is on Zoloft as an outpatient. ? ?Per chart review, most recent prior serum sodium value noted to be 141 in August 2018, while most recent prior serum creatinine value was noted to be 0.85 in August 2018.  ? ? ?[facility] ED Course:  ?Vital signs in the ED were notable for the following: Afebrile; heart rate 59-75; blood pressure initially 117/77, subsequently increased to 143/81 following interval administration of IV fluids, as further detailed below; respiratory rate 16-19, oxygen saturation 97 to 100% on room air. ? ?Labs were notable for the following: CMP notable for the following: Sodium 130, potassium 3.0, bicarbonate 27, anion gap 12, creatinine 2.03, glucose 111, liver enzymes within normal limits.  Lipase 10.  High-sensitivity troponin I initially noted to be 3, with repeat value trending down to 2.  CBC notable for white cell count 14,600, hemoglobin 13.4.  Urinalysis without full microscopy was notable for demonstrating no protein.  COVID-19/influenza PCR negative.  Blood cultures x2 as well as urine culture collected prior to initiation of IV antibiotics, as further detailed below. ? ?Imaging and additional notable ED work-up: EKG showed sinus bradycardia with heart rate 59, normal intervals, no evidence of T wave or ST changes, including no evidence of ST elevation.  CT abdomen/pelvis without contrast showed mild bilateral perinephric stranding concerning for pyelonephritis without evidence of renal obstruction nor any evidence of ureteral/renal stone; otherwise, CT abdomen/pelvis showed no evidence of acute intra-abdominal process. ? ?While in the ED, the following were administered: Normal saline x1 L bolus followed by initiation of continuous  NS at 125 cc/h.  Rocephin, Zofran 4 mg IV x1, potassium  chloride 40 mEq p.o. x1 dose. ? ?Subsequently, the patient was transferred to [facility] for overnight observation to [place] unit for further evaluation and management of suspected bilateral pyelonephritis, with presenting labs also notable for acute kidney injury, hypokalemia, acute hyponatremia, after presenting with complaint of generalized weakness.  ? ? ? ? ?Objective  ? ? ?Physical Exam: ?Vitals:  ? 08/08/21 2204 08/09/21 2202 08/09/21 0429 08/09/21 0817  ?BP: (!) 140/92 (!) 158/59 (!) 152/68 (!) 140/57  ?Pulse: 75 73 65 70  ?Resp: '19 18 18 17  '$ ?Temp: 98 ?F (36.7 ?C) 98 ?F (36.7 ?C) 98.1 ?F (36.7 ?C) 97.7 ?F (36.5 ?C)  ?TempSrc: Oral Oral Oral Oral  ?SpO2: 99% 96% 97% 97%  ?Weight: 72.3 kg     ?Height:      ? ? ?General: appears to be stated age; alert, oriented ?Skin: warm, dry, no rash ?Head:  AT/[place] ?Mouth:  Oral mucosa membranes appear dry, normal dentition ?Neck: supple; trachea midline ?Heart:  RRR; did not appreciate any M/R/G ?Lungs: CTAB, did not appreciate any wheezes, rales, or rhonchi ?Abdomen: + BS; soft, ND, mild tenderness to palpation in the bilateral lower abdominal quadrants, in the absence of any associated guarding, rigidity, or rebound tenderness. ?Vascular: 2+ pedal pulses b/l; 2+ radial pulses b/l ?Extremities: no peripheral edema, no muscle wasting ?Neuro: strength and sensation intact in upper and lower extremities b/l ? ? ? ? ?Labs on Admission: I have personally reviewed following labs and imaging studies ? ?CBC: ?Recent Labs  ?Lab 08/08/21 ?1222 08/09/21 ?0442  ?WBC 14.6* 9.3  ?NEUTROABS  --  6.8  ?HGB 13.4 11.8*  ?HCT 39.4 35.1*  ?MCV 75.3* 77.8*  ?PLT 396 308  ? ?Basic Metabolic Panel: ?Recent Labs  ?Lab 08/08/21 ?1222 08/09/21 ?0442  ?NA 130* 136  ?K 3.0* 3.5  ?CL 91* 104  ?CO2 27 21*  ?GLUCOSE 111* 114*  ?BUN 14 12  ?CREATININE 2.03* 1.66*  ?CALCIUM 10.3 9.1  ?MG  --  1.7  ? ?GFR: ?Estimated Creatinine Clearance: 30.3 mL/min (A) (by C-G formula based on SCr of 1.66 mg/dL  (H)). ?Liver Function Tests: ?Recent Labs  ?Lab 08/08/21 ?1222 08/09/21 ?0442  ?AST 16 14*  ?ALT 10 12  ?ALKPHOS 96 90  ?BILITOT 0.5 0.7  ?PROT 8.1 6.6  ?ALBUMIN 4.7 3.4*  ? ?Recent Labs  ?Lab 08/08/21 ?1222  ?LIPASE 10*  ? ?No results for input(s): AMMONIA in the last 168 hours. ?Coagulation Profile: ?No results for input(s): INR, PROTIME in the last 168 hours. ?Cardiac Enzymes: ?Recent Labs  ?Lab 08/09/21 ?5427  ?CKTOTAL 32*  ? ?BNP (last 3 results) ?No results for input(s): PROBNP in the last 8760 hours. ?HbA1C: ?Recent Labs  ?  08/09/21 ?0442  ?HGBA1C 5.9*  ? ?CBG: ?Recent Labs  ?Lab 08/08/21 ?2210 08/09/21 ?0739  ?GLUCAP 130* 121*  ? ?Lipid Profile: ?No results for input(s): CHOL, HDL, LDLCALC, TRIG, CHOLHDL, LDLDIRECT in the last 72 hours. ?Thyroid Function Tests: ?Recent Labs  ?  08/09/21 ?0442  ?TSH 2.286  ? ?Anemia Panel: ?No results for input(s): VITAMINB12, FOLATE, FERRITIN, TIBC, IRON, RETICCTPCT in the last 72 hours. ?Urine analysis: ?   ?Component Value Date/Time  ? COLORURINE COLORLESS (A) 08/08/2021 1413  ? APPEARANCEUR CLEAR 08/08/2021 1413  ? LABSPEC 1.006 08/08/2021 1413  ? PHURINE 5.5 08/08/2021 1413  ? GLUCOSEU NEGATIVE 08/08/2021 1413  ? HGBUR NEGATIVE 08/08/2021 1413  ? HGBUR negative 09/27/2009  [place] NEGATIVE 08/08/2021 1413  ? [place] NEGATIVE 08/08/2021 1413  ? PROTEINUR NEGATIVE 08/08/2021 1413  ? UROBILINOGEN 0.2 11/04/2009 1806  ? NITRITE NEGATIVE 08/08/2021 1413  ? LEUKOCYTESUR NEGATIVE 08/08/2021 1413  ? ? ?Radiological Exams on Admission: ?CT Abdomen Pelvis Wo Contrast ? ?Result Date: 08/08/2021 ?CLINICAL DATA:  Acute bilateral flank pain. EXAM: CT ABDOMEN AND PELVIS WITHOUT CONTRAST TECHNIQUE: Multidetector CT imaging of the abdomen and pelvis was performed following the standard protocol without IV contrast. RADIATION DOSE REDUCTION: This exam was performed according to the departmental dose-optimization program which includes automated exposure control, adjustment of  the mA and/or kV according to patient size and/or use of iterative reconstruction technique. COMPARISON:  None. FINDINGS: Lower chest: No acute abnormality. Hepatobiliary: No focal liver abnormality is seen.

## 2021-08-09 NOTE — Assessment & Plan Note (Signed)
?  ?#)   Generalized weakness:  4-5 day duration of generalized weakness, in the absence of any evidence of acute focal neurologic deficits, including no evidence of acute focal weakness.  Suspect contribution from physiologic stress stemming from suspected presenting acute bilateral pyelonephritis, with additional contribution from mild dehydration and resultant acute kidney injury and hyponatremia, as further detailed above.  No additional underlying infectious process identified at this time, including COVID-19/influenza negative.  No acute respiratory symptoms to warrant further evaluation via chest x-ray at this time.  ?  ?  ?Plan: work-up and management of presenting suspected acute bilateral pyelonephritis and acute hyponatremia, as described above. PT consult ordered for the AM. Fall precautions. CMP/CBC in the AM. Check TSH, MMA. Check CPK level.  Continuous IV fluids, as above. ?  ?  ?  ?

## 2021-08-09 NOTE — Assessment & Plan Note (Signed)
?  ?#)   Hypokalemia: Presenting serum potassium level noted to be 3.0.  Suspect contribution from recent decline in oral intake, as above.  Status post 40 mEq potassium chloride administered at [facility] earlier today.  We will proceed cautiously with additional potassium supplementation given concomitant suspected AKI with presenting serum creatinine of 2.0.  Consequently, we will repeat serum potassium level via BMP in the morning, to assist with determination of need for additional potassium supplementation. ?  ?Plan: Repeat CMP in the morning.  Monitor on telemetry.  Check serum magnesium level.  Hold home PPI for now. ?  ?  ?  ?

## 2021-08-09 NOTE — Evaluation (Signed)
Physical Therapy Evaluation ?Patient Details ?Name: [name] [facility] ?MRN: [id number] ?DOB: [birth year] ?Today's Date: 08/09/2021 ? ?History of Present Illness ? [name] is a 66 y.o. female with medical history significant for essential hypertension, type 2 diabetes mellitus, hyperlipidemia, who is admitted to [facility] on 08/08/2021 by way of transfer from [facility] emergency department with suspected pyelonephritis after presenting from home to the latter facility complaining of generalized weakness.  ?Clinical Impression ? Patient evaluated by Physical Therapy with no further acute PT needs identified. All education has been completed and the patient has no further questions. Recommend pt walk the hallways and instructed her in using gel going out and coming back in to clean her hands; See below for any follow-up Physical Therapy or equipment needs. PT is signing off. Thank you for this referral. ?   ?   ? ?Recommendations for follow up therapy are one component of a multi-disciplinary discharge planning process, led by the attending physician.  Recommendations may be updated based on patient status, additional functional criteria and insurance authorization. ? ?Follow Up Recommendations No PT follow up ? ?  ?Assistance Recommended at Discharge PRN  ?Patient can return home with the following ?   ? ?  ?Equipment Recommendations None recommended by PT  ?Recommendations for Other Services ?    ?  ?Functional Status Assessment Patient has not had a recent decline in their functional status  ? ?  ?Precautions / Restrictions Precautions ?Precautions: None ?Restrictions ?Weight Bearing Restrictions: No  ? ?  ? ?Mobility ? Bed Mobility ?Overal bed mobility: Independent ?  ?  ?  ?  ?  ?  ?  ?  ? ?Transfers ?Overall transfer level: Independent ?Equipment used: None ?  ?  ?  ?  ?  ?  ?  ?  ?  ? ?Ambulation/Gait ?Ambulation/Gait assistance: Independent ?Gait Distance (Feet): 400 Feet ?Assistive  device: None, IV Pole ?Gait Pattern/deviations: Step-through pattern ?  ?  ?  ?General Gait Details: No difficulty ? ?Stairs ?Stairs: Yes ?Stairs assistance: Modified independent (Device/Increase time) ?Stair Management: One rail Left, One rail Right, Alternating pattern, Forwards (L rail ascending, R rail descending) ?Number of Stairs: 5 ?General stair comments: No difficulty; reports she always uses the rail ? ?Wheelchair Mobility ?  ? ?Modified Rankin (Stroke Patients Only) ?  ? ?  ? ?Balance Overall balance assessment: Mild deficits observed, not formally tested ?  ?  ?  ?  ?  ?  ?  ?  ?  ?  ?  ?  ?  ?  ?  ?  ?  ?  ?   ? ? ? ?Pertinent Vitals/Pain Pain Assessment ?Pain Assessment: 0-10 ?Pain Score: 4  ?Pain Location: Back pain ?Pain Descriptors / Indicators: Discomfort, Sore ?Pain Intervention(s): Repositioned  ? ? ?Home Living Family/patient expects to be discharged to:: Private residence ?Living Arrangements: Spouse/significant other ?  ?Type of Home: House ?Home Access: Stairs to enter ?Entrance Stairs-Rails: Right;Left ?Entrance Stairs-Number of Steps: 4 ?  ?Home Layout: One level ?Home Equipment: None ?Additional Comments: Works in [employer] at [employer]  ?  ?Prior Function Prior Level of Function : Independent/Modified Independent ?  ?  ?  ?  ?  ?  ?  ?  ?  ? ? ?Hand Dominance  ? Dominant Hand: Right ? ?  ?Extremity/Trunk Assessment  ? Upper Extremity Assessment ?Upper Extremity Assessment: Overall WFL for tasks assessed ?  ? ?Lower Extremity Assessment ?Lower  Extremity Assessment: Overall WFL for tasks assessed ?  ? ?Cervical / Trunk Assessment ?Cervical / Trunk Assessment: Normal  ?Communication  ? Communication: No difficulties  ?Cognition Arousal/Alertness: Awake/alert ?Behavior During Therapy: [facility] for tasks assessed/performed ?Overall Cognitive Status: Within Functional Limits for tasks assessed ?  ?  ?  ?  ?  ?  ?  ?  ?  ?  ?  ?  ?  ?  ?  ?  ?  ?  ?  ? ?  ?General Comments General  comments (skin integrity, edema, etc.): Demonstrated how the recliner works to pt ? ?  ?Exercises    ? ?Assessment/Plan  ?  ?PT Assessment Patient does not need any further PT services  ?PT Problem List   ? ?   ?  ?PT Treatment Interventions     ? ?PT Goals (Current goals can be found in the Care Plan section)  ?Acute Rehab PT Goals ?Patient Stated Goal: Get better, return home, back to work ?PT Goal Formulation: All assessment and education complete, DC therapy ? ?  ?Frequency   ?  ? ? ?Co-evaluation   ?  ?  ?  ?  ? ? ?  ?AM-PAC PT "6 Clicks" Mobility  ?Outcome Measure Help needed turning from your back to your side while in a flat bed without using bedrails?: None ?Help needed moving from lying on your back to sitting on the side of a flat bed without using bedrails?: None ?Help needed moving to and from a bed to a chair (including a wheelchair)?: None ?Help needed standing up from a chair using your arms (e.g., wheelchair or bedside chair)?: None ?Help needed to walk in hospital room?: None ?Help needed climbing 3-5 steps with a railing? : None ?6 Click Score: 24 ? ?  ?End of Session   ?Activity Tolerance: Patient tolerated treatment well ?Patient left: in chair;with call bell/phone within reach ?Nurse Communication: Mobility status ?PT Visit Diagnosis: Muscle weakness (generalized) (M62.81) ?  ? ?Time: 0962-8366 ?PT Time Calculation (min) (ACUTE ONLY): 28 min ? ? ?Charges:   PT Evaluation ?$PT Eval Low Complexity: 1 Low ?PT Treatments ?$Gait Training: 8-22 mins ?  ?   ? ? ?Roney Marion, PT  ?Acute Rehabilitation Services ?Pager [phone] ?Office [phone] ? ? ?[place] ?08/09/2021, 12:29 PM ? ?

## 2021-08-10 DIAGNOSIS — E861 Hypovolemia: Secondary | ICD-10-CM | POA: Diagnosis present

## 2021-08-10 DIAGNOSIS — N10 Acute tubulo-interstitial nephritis: Secondary | ICD-10-CM | POA: Diagnosis not present

## 2021-08-10 DIAGNOSIS — E872 Acidosis, unspecified: Secondary | ICD-10-CM | POA: Diagnosis not present

## 2021-08-10 DIAGNOSIS — Z885 Allergy status to narcotic agent status: Secondary | ICD-10-CM | POA: Diagnosis not present

## 2021-08-10 DIAGNOSIS — E876 Hypokalemia: Secondary | ICD-10-CM | POA: Diagnosis present

## 2021-08-10 DIAGNOSIS — R001 Bradycardia, unspecified: Secondary | ICD-10-CM | POA: Diagnosis present

## 2021-08-10 DIAGNOSIS — I10 Essential (primary) hypertension: Secondary | ICD-10-CM | POA: Diagnosis not present

## 2021-08-10 DIAGNOSIS — I951 Orthostatic hypotension: Secondary | ICD-10-CM | POA: Diagnosis not present

## 2021-08-10 DIAGNOSIS — Z85828 Personal history of other malignant neoplasm of skin: Secondary | ICD-10-CM | POA: Diagnosis not present

## 2021-08-10 DIAGNOSIS — R69 Illness, unspecified: Secondary | ICD-10-CM | POA: Diagnosis not present

## 2021-08-10 DIAGNOSIS — F32A Depression, unspecified: Secondary | ICD-10-CM | POA: Diagnosis present

## 2021-08-10 DIAGNOSIS — Z7984 Long term (current) use of oral hypoglycemic drugs: Secondary | ICD-10-CM | POA: Diagnosis not present

## 2021-08-10 DIAGNOSIS — N179 Acute kidney failure, unspecified: Secondary | ICD-10-CM | POA: Diagnosis not present

## 2021-08-10 DIAGNOSIS — Z7982 Long term (current) use of aspirin: Secondary | ICD-10-CM | POA: Diagnosis not present

## 2021-08-10 DIAGNOSIS — G8929 Other chronic pain: Secondary | ICD-10-CM | POA: Diagnosis present

## 2021-08-10 DIAGNOSIS — K219 Gastro-esophageal reflux disease without esophagitis: Secondary | ICD-10-CM | POA: Diagnosis present

## 2021-08-10 DIAGNOSIS — E119 Type 2 diabetes mellitus without complications: Secondary | ICD-10-CM | POA: Diagnosis not present

## 2021-08-10 DIAGNOSIS — Z9049 Acquired absence of other specified parts of digestive tract: Secondary | ICD-10-CM | POA: Diagnosis not present

## 2021-08-10 DIAGNOSIS — Z20822 Contact with and (suspected) exposure to covid-19: Secondary | ICD-10-CM | POA: Diagnosis not present

## 2021-08-10 DIAGNOSIS — E7800 Pure hypercholesterolemia, unspecified: Secondary | ICD-10-CM | POA: Diagnosis present

## 2021-08-10 DIAGNOSIS — Z8249 Family history of ischemic heart disease and other diseases of the circulatory system: Secondary | ICD-10-CM | POA: Diagnosis not present

## 2021-08-10 DIAGNOSIS — Z8371 Family history of colonic polyps: Secondary | ICD-10-CM | POA: Diagnosis not present

## 2021-08-10 DIAGNOSIS — Z79899 Other long term (current) drug therapy: Secondary | ICD-10-CM | POA: Diagnosis not present

## 2021-08-10 DIAGNOSIS — R531 Weakness: Secondary | ICD-10-CM | POA: Diagnosis not present

## 2021-08-10 DIAGNOSIS — Z87891 Personal history of nicotine dependence: Secondary | ICD-10-CM | POA: Diagnosis not present

## 2021-08-10 DIAGNOSIS — E871 Hypo-osmolality and hyponatremia: Secondary | ICD-10-CM | POA: Diagnosis not present

## 2021-08-10 LAB — CBC WITH DIFFERENTIAL/PLATELET
Abs Immature Granulocytes: 0.02 10*3/uL (ref 0.00–0.07)
Basophils Absolute: 0.1 10*3/uL (ref 0.0–0.1)
Basophils Relative: 1 %
Eosinophils Absolute: 0.2 10*3/uL (ref 0.0–0.5)
Eosinophils Relative: 3 %
HCT: 29.6 % — ABNORMAL LOW (ref 36.0–46.0)
Hemoglobin: 9.8 g/dL — ABNORMAL LOW (ref 12.0–15.0)
Immature Granulocytes: 0 %
Lymphocytes Relative: 20 %
Lymphs Abs: 1.3 10*3/uL (ref 0.7–4.0)
MCH: 26.1 pg (ref 26.0–34.0)
MCHC: 33.1 g/dL (ref 30.0–36.0)
MCV: 78.9 fL — ABNORMAL LOW (ref 80.0–100.0)
Monocytes Absolute: 0.6 10*3/uL (ref 0.1–1.0)
Monocytes Relative: 9 %
Neutro Abs: 4.6 10*3/uL (ref 1.7–7.7)
Neutrophils Relative %: 67 %
Platelets: 270 10*3/uL (ref 150–400)
RBC: 3.75 MIL/uL — ABNORMAL LOW (ref 3.87–5.11)
RDW: 13.4 % (ref 11.5–15.5)
WBC: 6.8 10*3/uL (ref 4.0–10.5)
nRBC: 0 % (ref 0.0–0.2)

## 2021-08-10 LAB — BASIC METABOLIC PANEL
Anion gap: 7 (ref 5–15)
BUN: 6 mg/dL — ABNORMAL LOW (ref 8–23)
CO2: 22 mmol/L (ref 22–32)
Calcium: 8.6 mg/dL — ABNORMAL LOW (ref 8.9–10.3)
Chloride: 107 mmol/L (ref 98–111)
Creatinine, Ser: 1 mg/dL (ref 0.44–1.00)
GFR, Estimated: >60 mL/min (ref >60)
Glucose, Bld: 143 mg/dL — ABNORMAL HIGH (ref 70–99)
Potassium: 3.3 mmol/L — ABNORMAL LOW (ref 3.5–5.1)
Sodium: 136 mmol/L (ref 135–145)

## 2021-08-10 LAB — GLUCOSE, CAPILLARY
Glucose-Capillary: 104 mg/dL — ABNORMAL HIGH (ref 70–99)
Glucose-Capillary: 103 mg/dL — ABNORMAL HIGH (ref 70–99)

## 2021-08-10 MED ORDER — CEFUROXIME AXETIL 250 MG PO TABS
250.0000 mg | ORAL_TABLET | Freq: Two times a day (BID) | ORAL | 0 refills | Status: AC
Start: 2021-08-10 — End: 2021-08-17

## 2021-08-10 NOTE — Progress Notes (Signed)
DISCHARGE NOTE HOME ?Lorri Frederick to be discharged Home per MD order. Discussed prescriptions and follow up appointments with the patient. Prescriptions given to patient; medication list explained in detail. Patient verbalized understanding. ? ?Skin clean, dry and intact without evidence of skin break down, no evidence of skin tears noted. IV catheter discontinued intact. Site without signs and symptoms of complications. Dressing and pressure applied. Pt denies pain at the site currently. No complaints noted. ? ?Patient free of lines, drains, and wounds.  ? ?An After Visit Summary (AVS) was printed and given to the patient. ?Patient escorted via wheelchair, and discharged home via private auto. ? ?Rogelia Mire, RN  ?

## 2021-08-10 NOTE — TOC Transition Note (Signed)
Transition of Care (TOC) - CM/SW Discharge Note ? ? ?Patient Details  ?Name: [name] [facility] ?MRN: [id number] ?Date of Birth: 1955-09-10 ? ?Transition of Care (TOC) CM/SW Contact:  ?Tom-Johnson, Renea Ee, RN ?Phone Number: ?08/10/2021, 2:48 PM ? ? ?Clinical Narrative:    ? ?Patient is scheduled for discharge today. No PT f/u noted. Denies any other TOC needs. Husband to transport at discharge. No further TOC needs noted. ? ?Final next level of care: Home/Self Care ?Barriers to Discharge: Barriers Resolved ? ? ?Patient Goals and CMS Choice ?Patient states their goals for this hospitalization and ongoing recovery are:: To return home ?CMS Medicare.gov Compare Post Acute Care list provided to:: Patient ?Choice offered to / list presented to : NA ? ?Discharge Placement ?  ?           ?  ?Patient to be transferred to facility by: Family ?  ?  ? ?Discharge Plan and Services ?  ?Discharge Planning Services: CM Consult ?Post Acute Care Choice: NA          ?DME Arranged: N/A ?DME Agency: NA ?  ?  ?  ?HH Arranged: NA ?[place] Agency: NA ?  ?  ?  ? ?Social Determinants of Health (SDOH) Interventions ?  ? ? ?Readmission Risk Interventions ?   ? View : No data to display.  ?  ?  ?  ? ? ? ? ? ?

## 2021-08-10 NOTE — Discharge Summary (Signed)
Physician Discharge Summary  ?[facility] WUJ:[id number] DOB: June 03, 1955 DOA: 08/08/2021 ? ?PCP: Marda Stalker, PA-C ? ?Admit date: 08/08/2021 ?Discharge date: 08/10/2021 ? ?Time spent: 45 minutes ? ?Recommendations for Outpatient Follow-up:  ?Follow-up with primary care physician in 1 to 2 weeks ?Follow-up on final urine culture data and results ? ? ?Discharge Diagnoses:  ?Principal Problem: ?  Acute pyelonephritis ?Active Problems: ?  HLD (hyperlipidemia) ?  Depression ?  Essential hypertension ?  GERD ?  Generalized weakness ?  AKI (acute kidney injury) ([place]) ?  Hypokalemia ?  Hyponatremia ?  Weakness ? ? ?Discharge Condition: Stable ? ? ?Filed Weights  ? 08/08/21 1335 08/08/21 2204  ?Weight: 70.3 kg 72.3 kg  ? ? ?History of present illness:  ?Chief Complaint: Generalized weakness ?  ?HPI: [name] is a 66 y.o. female with medical history significant for essential hypertension, type 2 diabetes mellitus, hyperlipidemia, who is admitted to [facility] on 08/08/2021 by way of transfer from [facility] emergency department with suspected pyelonephritis after presenting from home to the latter facility complaining of generalized weakness. ?  ?The patient reports 4 to 5 days of generalized weakness in the absence of any associated acute focal weakness, acute focal numbness, paresthesias, facial droop, slurred speech, expressive aphasia, acute change in vision, dysphagia, vertigo. She notes that this has been associated with intermittent nausea in the absence of any vomiting but resulting in decline in oral intake of both food and water over that timeframe. Subsequently, over the last 1 to 2 days, she has noted some dizziness when rising from a seated to a standing position, but denies any associated syncope.  Denies any associated chest pain, shortness of breath, palpitations, diaphoresis.  Denies any recent trauma or travel.  No recent calf tenderness or new lower extremity erythema.  No  personal history of DVT/PE. No recent cough, rash.  ?  ?Over the last 3 to 4 days, she is also noted bilateral lower abdominal discomfort with radiation into the bilateral flanks.  Describes the pain as sharp in nature, intermittent but becoming more frequent over the last few days.  Reports exacerbation with palpation over the lower portion of the abdomen bilaterally. denies any associated acute dysuria, gross hematuria, or change in urinary urgency/frequency.  No recent diarrhea, melena, or hematochezia.  Denies associated subjective fever, chills, rigors, or generalized myalgias. ?  ?Medical history notable for essential pretension, for which she is on Lopressor, losartan, HCTZ.  Additionally, the setting of depression, she is on Zoloft as an outpatient. ?  ?Per chart review, most recent prior serum sodium value noted to be 141 in August 2018, while most recent prior serum creatinine value was noted to be 0.85 in August 2018.  ?  ?  ?[facility] ED Course:  ?Vital signs in the ED were notable for the following: Afebrile; heart rate 59-75; blood pressure initially 117/77, subsequently increased to 143/81 following interval administration of IV fluids, as further detailed below; respiratory rate 16-19, oxygen saturation 97 to 100% on room air. ?  ?Labs were notable for the following: CMP notable for the following: Sodium 130, potassium 3.0, bicarbonate 27, anion gap 12, creatinine 2.03, glucose 111, liver enzymes within normal limits.  Lipase 10.  High-sensitivity troponin I initially noted to be 3, with repeat value trending down to 2.  CBC notable for white cell count 14,600, hemoglobin 13.4.  Urinalysis without full microscopy was notable for demonstrating no protein.  COVID-19/influenza PCR negative.  Blood cultures x2 as  well as urine culture collected prior to initiation of IV antibiotics, as further detailed below. ?  ?Imaging and additional notable ED work-up: EKG showed sinus bradycardia with heart rate 59,  normal intervals, no evidence of T wave or ST changes, including no evidence of ST elevation.  CT abdomen/pelvis without contrast showed mild bilateral perinephric stranding concerning for pyelonephritis without evidence of renal obstruction nor any evidence of ureteral/renal stone; otherwise, CT abdomen/pelvis showed no evidence of acute intra-abdominal process. ?  ?While in the ED, the following were administered: Normal saline x1 L bolus followed by initiation of continuous NS at 125 cc/h.  Rocephin, Zofran 4 mg IV x1, potassium chloride 40 mEq p.o. x1 dose. ?  ?Subsequently, the patient was transferred to [facility] for overnight observation to [place] unit for further evaluation and management of suspected bilateral pyelonephritis, with presenting labs also notable for acute kidney injury, hypokalemia, acute hyponatremia, after presenting with complaint of generalized weakness.  ?  ? ?Hospital Course:  ?66 year old female was admitted with acute pyelonephritis.  Urine cultures and blood cultures were obtained.  She had initially acute kidney injury and lactic acidosis which all improved with IV fluids.  She was placed on Rocephin.  Patient clinically improved over the next 48 hours.  Her urine and blood cultures are negative at the time of discharge.  Final results of these will need to be followed up with her primary care physician appointment in 1 to 2 weeks.  Patient's renal failure resolved.  Her suprapubic and flank pain also almost completely resolved with antibiotics.  Get a discharge her to complete a another 10 days of p.o. Ceftin to start tomorrow.  She received Rocephin for the last 3 days for 3 doses.  Patient is being discharged in stable and improved condition with appropriate follow-up with a primary care physician in 1 to 2 weeks. ? ? ?Discharge Exam: ?Vitals:  ? 08/10/21 0451 08/10/21 0847  ?BP: (!) 151/68 (!) 149/66  ?Pulse: (!) 59 69  ?Resp: 20 16  ?Temp: 98.1 ?F (36.7 ?C) 97.9 ?F (36.6 ?C)   ?SpO2: 97% 98%  ? ? ?General: Alert and oriented no apparent distress ?Cardiovascular: Regular rate and rhythm without murmurs rubs or gallops ?Respiratory: Clear to auscultation bilaterally no wheezes rhonchi or rales ? ?Discharge Instructions ? ? ?Discharge Instructions   ? ? Diet - low sodium heart healthy   Complete by: As directed ?  ? Discharge instructions   Complete by: As directed ?  ? Follow-up with primary care physician in 1 to 2 weeks  ? Increase activity slowly   Complete by: As directed ?  ? ?  ? ?Allergies as of 08/10/2021   ? ?   Reactions  ? Codeine Nausea And Vomiting  ? ?  ? ?  ?Medication List  ?  ? ?STOP taking these medications   ? ?ciprofloxacin 500 MG tablet ?Commonly known as: CIPRO ?  ? ?  ? ?TAKE these medications   ? ?aspirin 325 MG EC tablet ?Take 1 tablet (325 mg total) by mouth daily. ?  ?cefUROXime 250 MG tablet ?Commonly known as: CEFTIN ?Take 1 tablet (250 mg total) by mouth 2 (two) times daily with a meal for 7 days. ?  ?clobetasol ointment 0.05 % ?Commonly known as: TEMOVATE ?Apply to legs nightly 30 days ?  ?dexlansoprazole 60 MG capsule ?Commonly known as: DEXILANT ?Take 60 mg by mouth daily. ?  ?doxepin 10 MG capsule ?Commonly known as: SINEQUAN ?TAKE ONE CAPSULE  BY MOUTH AT BEDTIME ?  ?fluticasone 50 MCG/ACT nasal spray ?Commonly known as: FLONASE ?Place 2 sprays into both nostrils daily. ?  ?halobetasol 0.05 % cream ?Commonly known as: ULTRAVATE ?Apply topically daily. After bathing to wet skin. ?  ?hydrochlorothiazide 25 MG tablet ?Commonly known as: HYDRODIURIL ?Take 25 mg by mouth daily. ?  ?ibuprofen 800 MG tablet ?Commonly known as: ADVIL ?Take 800 mg by mouth 2 (two) times daily with a meal. ?  ?Ibuprofen-Famotidine 800-26.6 MG Tabs ?Duexis 800 mg-26.6 mg tablet ? Take 1 tablet 3 times a day by oral route. ?  ?losartan 100 MG tablet ?Commonly known as: COZAAR ?Take 100 mg by mouth daily. ?  ?metFORMIN 500 MG tablet ?Commonly known as: GLUCOPHAGE ?Take 500 mg by mouth 2  (two) times daily with a meal. ?  ?metoprolol tartrate 25 MG tablet ?Commonly known as: LOPRESSOR ?Take 0.5 tablets (12.5 mg total) by mouth 2 (two) times daily. ?What changed:  ?how much to take ?when

## 2021-08-10 NOTE — Plan of Care (Signed)
?  Problem: Clinical Measurements: ?Goal: Will remain free from infection ?Outcome: Progressing ?Goal: Diagnostic test results will improve ?Outcome: Progressing ?  ?Problem: Elimination: ?Goal: Will not experience complications related to bowel motility ?Outcome: Progressing ?Goal: Will not experience complications related to urinary retention ?Outcome: Progressing ?  ?Problem: Urinary Elimination: ?Goal: Signs and symptoms of infection will decrease ?Outcome: Progressing ?  ?Problem: Activity: ?Goal: Risk for activity intolerance will decrease ?Outcome: Adequate for Discharge ?  ?Problem: Nutrition: ?Goal: Adequate nutrition will be maintained ?Outcome: Adequate for Discharge ?  ?Problem: Coping: ?Goal: Level of anxiety will decrease ?Outcome: Adequate for Discharge ?  ?Problem: Pain Managment: ?Goal: General experience of comfort will improve ?Outcome: Adequate for Discharge ?  ?

## 2021-08-11 LAB — METHYLMALONIC ACID, SERUM: Methylmalonic Acid, Quantitative: 192 nmol/L (ref 0–378)

## 2021-08-13 LAB — CULTURE, BLOOD (ROUTINE X 2)
Culture: NO GROWTH
Culture: NO GROWTH
Special Requests: ADEQUATE
Special Requests: ADEQUATE

## 2021-08-17 DIAGNOSIS — D649 Anemia, unspecified: Secondary | ICD-10-CM | POA: Diagnosis not present

## 2021-08-17 DIAGNOSIS — I700 Atherosclerosis of aorta: Secondary | ICD-10-CM | POA: Diagnosis not present

## 2021-08-17 DIAGNOSIS — N12 Tubulo-interstitial nephritis, not specified as acute or chronic: Secondary | ICD-10-CM | POA: Diagnosis not present

## 2021-08-24 ENCOUNTER — Other Ambulatory Visit: Payer: Self-pay | Admitting: *Deleted

## 2021-08-24 DIAGNOSIS — Z122 Encounter for screening for malignant neoplasm of respiratory organs: Secondary | ICD-10-CM

## 2021-08-24 DIAGNOSIS — Z87891 Personal history of nicotine dependence: Secondary | ICD-10-CM

## 2021-09-06 ENCOUNTER — Other Ambulatory Visit: Payer: Medicare HMO

## 2021-09-07 ENCOUNTER — Ambulatory Visit (INDEPENDENT_AMBULATORY_CARE_PROVIDER_SITE_OTHER): Payer: Medicare HMO | Admitting: Dermatology

## 2021-09-07 ENCOUNTER — Encounter: Payer: Self-pay | Admitting: Dermatology

## 2021-09-07 DIAGNOSIS — L239 Allergic contact dermatitis, unspecified cause: Secondary | ICD-10-CM

## 2021-09-07 DIAGNOSIS — L2084 Intrinsic (allergic) eczema: Secondary | ICD-10-CM

## 2021-09-07 MED ORDER — CLOBETASOL PROPIONATE 0.05 % EX CREA: 1.0000 "application " | TOPICAL_CREAM | Freq: Two times a day (BID) | CUTANEOUS | 6 refills | Status: DC

## 2021-09-07 MED ORDER — TACROLIMUS 0.1 % EX OINT: TOPICAL_OINTMENT | Freq: Two times a day (BID) | CUTANEOUS | 0 refills | Status: DC

## 2021-09-08 ENCOUNTER — Encounter: Payer: Self-pay | Admitting: Dermatology

## 2021-09-08 ENCOUNTER — Telehealth: Payer: Self-pay

## 2021-09-08 NOTE — Progress Notes (Signed)
? ?  Follow-Up Visit ?  ?Subjective  ?[name] is a 66 y.o. female who presents for the following: Rash (On lower legs & back- x 2 weeks-  + itch tx- mupirocin ointment  ). ? ?Severe flare of her chronic rash, mainly legs and torso, intense itching.  No relief with mupirocin. ?Location:  ?Duration:  ?Quality:  ?Associated Signs/Symptoms: ?Modifying Factors:  ?Severity:  ?Timing: ?Context:  ? ?Objective  ?Well appearing patient in no apparent distress; mood and affect are within normal limits. ?Eczematous patches over 20% body surface area, affecting her sleep.  Significant negative impact on quality of life ? ? ? ? ? ? ?A focused examination was performed including head, neck, arms, torso, legs.. Relevant physical exam findings are noted in the Assessment and Plan. ? ? ?Assessment & Plan  ? ? ?Intrinsic (allergic) eczema ? ?Dupixent new start.  She may use clobetasol cream daily until this approval. ? ?clobetasol cream (TEMOVATE) 0.05 % ?Apply 1 application. topically 2 (two) times daily. ? ?tacrolimus (PROTOPIC) 0.1 % ointment ?Apply topically 2 (two) times daily. ? ? ? ? ? ?I, Lavonna Monarch, MD, have reviewed all documentation for this visit.  The documentation on 09/08/21 for the exam, diagnosis, procedures, and orders are all accurate and complete. ?

## 2021-09-08 NOTE — Telephone Encounter (Signed)
Dupixent enrollment form and office notes faxed to [facility].  ?

## 2021-09-08 NOTE — Telephone Encounter (Signed)
Dupixent new start prescription and office notes sent through the [facility] portal.  ?

## 2021-09-12 DIAGNOSIS — H5501 Congenital nystagmus: Secondary | ICD-10-CM | POA: Diagnosis not present

## 2021-09-12 DIAGNOSIS — H0102A Squamous blepharitis right eye, upper and lower eyelids: Secondary | ICD-10-CM | POA: Diagnosis not present

## 2021-09-12 DIAGNOSIS — H0102B Squamous blepharitis left eye, upper and lower eyelids: Secondary | ICD-10-CM | POA: Diagnosis not present

## 2021-09-12 DIAGNOSIS — Z961 Presence of intraocular lens: Secondary | ICD-10-CM | POA: Diagnosis not present

## 2021-09-12 DIAGNOSIS — H3562 Retinal hemorrhage, left eye: Secondary | ICD-10-CM | POA: Diagnosis not present

## 2021-09-12 DIAGNOSIS — H53022 Refractive amblyopia, left eye: Secondary | ICD-10-CM | POA: Diagnosis not present

## 2021-09-14 ENCOUNTER — Telehealth: Payer: Self-pay | Admitting: Dermatology

## 2021-09-14 NOTE — Telephone Encounter (Signed)
Approval for Dupixent  ?05/15/21-01/10/22 per [employer] and [employer] ?ID [id number] ?No PA number  ?

## 2021-09-14 NOTE — Telephone Encounter (Signed)
Patient left message on office voice mail that the medication that Lavonna Monarch, M.D. is recommending is too expensive.  Patient stated that her copay would be over $1000.00.  What does she need to do? ?

## 2021-09-15 NOTE — Telephone Encounter (Signed)
Patient aware dupixent my way portal and we will sample her until we get her apporval ?

## 2021-09-19 ENCOUNTER — Telehealth: Payer: Self-pay | Admitting: *Deleted

## 2021-09-19 NOTE — Telephone Encounter (Signed)
Fax from patient insurance company. Patient is approved for her Dupixent 05-15-2021--01/10/22. ?

## 2021-09-26 ENCOUNTER — Telehealth: Payer: Self-pay | Admitting: Dermatology

## 2021-09-26 NOTE — Telephone Encounter (Signed)
Patient is calling to check on the status of her Dupixent prescription because she is itching really bad. ?

## 2021-09-26 NOTE — Telephone Encounter (Signed)
Phone call to patient to inform her that we can get her scheduled for a nurse visit and give her samples of the [place].  Patient aware and appointment scheduled.  ?

## 2021-09-27 ENCOUNTER — Ambulatory Visit (INDEPENDENT_AMBULATORY_CARE_PROVIDER_SITE_OTHER): Payer: Medicare HMO

## 2021-09-27 DIAGNOSIS — L309 Dermatitis, unspecified: Secondary | ICD-10-CM

## 2021-09-27 DIAGNOSIS — L2084 Intrinsic (allergic) eczema: Secondary | ICD-10-CM

## 2021-09-27 MED ORDER — DUPIXENT 300 MG/2ML ~~LOC~~ SOAJ: 600.0000 mg | Freq: Once | SUBCUTANEOUS | 0 refills | Status: AC

## 2021-09-27 NOTE — Progress Notes (Signed)
Patient here today for [place] training, patient gave injections in her legs. Patient tolerated well.  ?

## 2021-09-29 DIAGNOSIS — D649 Anemia, unspecified: Secondary | ICD-10-CM | POA: Diagnosis not present

## 2021-10-12 ENCOUNTER — Telehealth: Payer: Self-pay | Admitting: *Deleted

## 2021-10-12 NOTE — Telephone Encounter (Signed)
Incoming fax from [place] my way- patient will received dupixent through dupixent my way patient assistance program.

## 2021-10-24 DIAGNOSIS — H3562 Retinal hemorrhage, left eye: Secondary | ICD-10-CM | POA: Diagnosis not present

## 2021-11-07 ENCOUNTER — Inpatient Hospital Stay: Admission: RE | Admit: 2021-11-07 | Payer: Medicare HMO | Source: Ambulatory Visit

## 2021-11-09 ENCOUNTER — Encounter (INDEPENDENT_AMBULATORY_CARE_PROVIDER_SITE_OTHER): Payer: Medicare HMO | Admitting: Ophthalmology

## 2021-11-09 DIAGNOSIS — H353122 Nonexudative age-related macular degeneration, left eye, intermediate dry stage: Secondary | ICD-10-CM

## 2021-11-09 DIAGNOSIS — I10 Essential (primary) hypertension: Secondary | ICD-10-CM

## 2021-11-09 DIAGNOSIS — H348322 Tributary (branch) retinal vein occlusion, left eye, stable: Secondary | ICD-10-CM

## 2021-11-09 DIAGNOSIS — H43813 Vitreous degeneration, bilateral: Secondary | ICD-10-CM | POA: Diagnosis not present

## 2021-11-09 DIAGNOSIS — H35033 Hypertensive retinopathy, bilateral: Secondary | ICD-10-CM | POA: Diagnosis not present

## 2021-11-18 DIAGNOSIS — B9789 Other viral agents as the cause of diseases classified elsewhere: Secondary | ICD-10-CM | POA: Diagnosis not present

## 2021-11-18 DIAGNOSIS — J329 Chronic sinusitis, unspecified: Secondary | ICD-10-CM | POA: Diagnosis not present

## 2021-11-29 DIAGNOSIS — Z8719 Personal history of other diseases of the digestive system: Secondary | ICD-10-CM | POA: Diagnosis not present

## 2021-11-29 DIAGNOSIS — Z1211 Encounter for screening for malignant neoplasm of colon: Secondary | ICD-10-CM | POA: Diagnosis not present

## 2021-11-29 DIAGNOSIS — E876 Hypokalemia: Secondary | ICD-10-CM | POA: Diagnosis not present

## 2021-11-29 DIAGNOSIS — D649 Anemia, unspecified: Secondary | ICD-10-CM | POA: Diagnosis not present

## 2021-12-09 ENCOUNTER — Ambulatory Visit: Payer: Self-pay

## 2021-12-09 NOTE — Patient Outreach (Signed)
  Care Coordination   Visit Note   12/09/2021 Name: [name] MRN: [id number] DOB: 08-26-1955  [name] is a 66 y.o. year old female who sees Marda Stalker, [place] for primary care. I spoke with  [name] by phone today.   Follow up plan:  Patient requested to schedule outreach for next week. Contact information provided. Agreed to call if assistance is needed prior to the scheduled outreach.  Encounter Outcome:  Pt. Scheduled for 12/15/21.   [place] [facility] [phone]

## 2021-12-12 DIAGNOSIS — K5730 Diverticulosis of large intestine without perforation or abscess without bleeding: Secondary | ICD-10-CM | POA: Diagnosis not present

## 2021-12-12 DIAGNOSIS — K449 Diaphragmatic hernia without obstruction or gangrene: Secondary | ICD-10-CM | POA: Diagnosis not present

## 2021-12-12 DIAGNOSIS — K5289 Other specified noninfective gastroenteritis and colitis: Secondary | ICD-10-CM | POA: Diagnosis not present

## 2021-12-12 DIAGNOSIS — Q399 Congenital malformation of esophagus, unspecified: Secondary | ICD-10-CM | POA: Diagnosis not present

## 2021-12-12 DIAGNOSIS — D120 Benign neoplasm of cecum: Secondary | ICD-10-CM | POA: Diagnosis not present

## 2021-12-12 DIAGNOSIS — Z1211 Encounter for screening for malignant neoplasm of colon: Secondary | ICD-10-CM | POA: Diagnosis not present

## 2021-12-12 DIAGNOSIS — D124 Benign neoplasm of descending colon: Secondary | ICD-10-CM | POA: Diagnosis not present

## 2021-12-12 DIAGNOSIS — D509 Iron deficiency anemia, unspecified: Secondary | ICD-10-CM | POA: Diagnosis not present

## 2021-12-12 DIAGNOSIS — K2930 Chronic superficial gastritis without bleeding: Secondary | ICD-10-CM | POA: Diagnosis not present

## 2021-12-12 DIAGNOSIS — K222 Esophageal obstruction: Secondary | ICD-10-CM | POA: Diagnosis not present

## 2021-12-12 DIAGNOSIS — D123 Benign neoplasm of transverse colon: Secondary | ICD-10-CM | POA: Diagnosis not present

## 2021-12-12 DIAGNOSIS — K3189 Other diseases of stomach and duodenum: Secondary | ICD-10-CM | POA: Diagnosis not present

## 2021-12-14 DIAGNOSIS — K5289 Other specified noninfective gastroenteritis and colitis: Secondary | ICD-10-CM | POA: Diagnosis not present

## 2021-12-14 DIAGNOSIS — K2930 Chronic superficial gastritis without bleeding: Secondary | ICD-10-CM | POA: Diagnosis not present

## 2021-12-14 DIAGNOSIS — D124 Benign neoplasm of descending colon: Secondary | ICD-10-CM | POA: Diagnosis not present

## 2021-12-29 ENCOUNTER — Ambulatory Visit: Payer: Self-pay

## 2021-12-29 NOTE — Patient Outreach (Signed)
  Care Coordination   Initial Visit Note   12/29/2021 Name: [name] MRN: [id number] DOB: 1956-02-07  [name] is a 66 y.o. year old female who sees Marda Stalker, [place] for primary care. I spoke with  [name] by phone today  What matters to the patients health and wellness today?  Health Maintenance    Goals Addressed             This Visit's Progress    Health Maintenance       Care Coordination Interventions: Reviewed medications. Patient reports managing well. Denies concerns r/t prescription cost. Expressed concerns regarding pharmacy pick-up. Interested in discussing options for mail delivery. Will forward request to the Pharmacy team. Reviewed plan for disease management. Reports adhering to plan and attending medical appointments as scheduled. Confirmed receipt of letter from [facility] r/t clinic closure. She will establish care with another Dermatology practice. Reports appointment has been scheduled for 01/10/22. AWV up to date. Completed on 07/14/21.        SDOH assessments and interventions completed:  Yes  SDOH Interventions Today    Flowsheet Row Most Recent Value  SDOH Interventions   Food Insecurity Interventions Intervention Not Indicated  Transportation Interventions Intervention Not Indicated        Care Coordination Interventions Activated:  Yes  Care Coordination Interventions:  Yes, provided   Follow up plan: Follow up call scheduled for January 12, 2022.    Encounter Outcome:  Pt. Visit Completed    [facility] [phone]

## 2021-12-29 NOTE — Patient Instructions (Signed)
Visit Information Thank you for allowing the Care Management team to participate in your care. It was great speaking with you!    Following are the goals we discussed today:   Goals Addressed             This Visit's Progress    Health Maintenance       Care Coordination Interventions: Reviewed medications. Patient reports managing well. Denies concerns r/t prescription cost. Expressed concerns regarding pharmacy pick-up. Interested in discussing options for mail delivery. Will forward request to the Pharmacy team. Reviewed plan for disease management. Reports adhering to plan and attending medical appointments as scheduled. Confirmed receipt of letter from [facility] r/t clinic closure. She will establish care with another Dermatology practice. Reports appointment has been scheduled for 01/10/22. AWV up to date. Completed on 07/14/21.        Our next appointment is by telephone on January 12, 2022 at 4:30 pm. Please call the care guide team at [phone] if you need to cancel or reschedule your appointment.    [name] verbalized understanding of information discussed during the telephonic outreach. Declined need for mailed instructions or resources.   A member of the care management team will follow up later this month.   [place] [facility] [phone]

## 2022-01-01 DIAGNOSIS — Z03818 Encounter for observation for suspected exposure to other biological agents ruled out: Secondary | ICD-10-CM | POA: Diagnosis not present

## 2022-01-01 DIAGNOSIS — H6991 Unspecified Eustachian tube disorder, right ear: Secondary | ICD-10-CM | POA: Diagnosis not present

## 2022-01-01 DIAGNOSIS — B349 Viral infection, unspecified: Secondary | ICD-10-CM | POA: Diagnosis not present

## 2022-01-01 DIAGNOSIS — J029 Acute pharyngitis, unspecified: Secondary | ICD-10-CM | POA: Diagnosis not present

## 2022-01-04 ENCOUNTER — Ambulatory Visit: Payer: Medicare HMO | Admitting: Dermatology

## 2022-01-10 DIAGNOSIS — L570 Actinic keratosis: Secondary | ICD-10-CM | POA: Diagnosis not present

## 2022-01-10 DIAGNOSIS — L209 Atopic dermatitis, unspecified: Secondary | ICD-10-CM | POA: Diagnosis not present

## 2022-01-12 ENCOUNTER — Ambulatory Visit: Payer: Self-pay

## 2022-01-23 ENCOUNTER — Ambulatory Visit
Admission: RE | Admit: 2022-01-23 | Discharge: 2022-01-23 | Disposition: A | Discharge status: Home / Self Care | Payer: Medicare HMO | Source: Ambulatory Visit | Attending: Family Medicine | Admitting: Family Medicine

## 2022-01-23 DIAGNOSIS — M810 Age-related osteoporosis without current pathological fracture: Secondary | ICD-10-CM | POA: Diagnosis not present

## 2022-01-23 DIAGNOSIS — M8589 Other specified disorders of bone density and structure, multiple sites: Secondary | ICD-10-CM | POA: Diagnosis not present

## 2022-01-23 DIAGNOSIS — Z780 Asymptomatic menopausal state: Secondary | ICD-10-CM | POA: Diagnosis not present

## 2022-01-24 DIAGNOSIS — Z86010 Personal history of colonic polyps: Secondary | ICD-10-CM | POA: Diagnosis not present

## 2022-01-24 DIAGNOSIS — K529 Noninfective gastroenteritis and colitis, unspecified: Secondary | ICD-10-CM | POA: Diagnosis not present

## 2022-01-24 DIAGNOSIS — D509 Iron deficiency anemia, unspecified: Secondary | ICD-10-CM | POA: Diagnosis not present

## 2022-01-24 DIAGNOSIS — E559 Vitamin D deficiency, unspecified: Secondary | ICD-10-CM | POA: Diagnosis not present

## 2022-01-24 DIAGNOSIS — K2970 Gastritis, unspecified, without bleeding: Secondary | ICD-10-CM | POA: Diagnosis not present

## 2022-01-24 DIAGNOSIS — K5909 Other constipation: Secondary | ICD-10-CM | POA: Diagnosis not present

## 2022-02-13 DIAGNOSIS — L570 Actinic keratosis: Secondary | ICD-10-CM | POA: Diagnosis not present

## 2022-02-13 DIAGNOSIS — T300 Burn of unspecified body region, unspecified degree: Secondary | ICD-10-CM | POA: Diagnosis not present

## 2022-02-13 DIAGNOSIS — Z79899 Other long term (current) drug therapy: Secondary | ICD-10-CM | POA: Diagnosis not present

## 2022-02-13 DIAGNOSIS — L209 Atopic dermatitis, unspecified: Secondary | ICD-10-CM | POA: Diagnosis not present

## 2022-02-28 DIAGNOSIS — Z23 Encounter for immunization: Secondary | ICD-10-CM | POA: Diagnosis not present

## 2022-03-17 DIAGNOSIS — L209 Atopic dermatitis, unspecified: Secondary | ICD-10-CM | POA: Diagnosis not present

## 2022-03-17 DIAGNOSIS — Z79899 Other long term (current) drug therapy: Secondary | ICD-10-CM | POA: Diagnosis not present

## 2022-03-24 ENCOUNTER — Encounter (INDEPENDENT_AMBULATORY_CARE_PROVIDER_SITE_OTHER): Payer: Medicare HMO | Admitting: Ophthalmology

## 2022-04-10 DIAGNOSIS — R059 Cough, unspecified: Secondary | ICD-10-CM | POA: Diagnosis not present

## 2022-04-10 DIAGNOSIS — R52 Pain, unspecified: Secondary | ICD-10-CM | POA: Diagnosis not present

## 2022-04-10 DIAGNOSIS — H66002 Acute suppurative otitis media without spontaneous rupture of ear drum, left ear: Secondary | ICD-10-CM | POA: Diagnosis not present

## 2022-04-10 DIAGNOSIS — R6883 Chills (without fever): Secondary | ICD-10-CM | POA: Diagnosis not present

## 2022-04-14 ENCOUNTER — Other Ambulatory Visit: Payer: Self-pay

## 2022-04-14 DIAGNOSIS — Y92009 Unspecified place in unspecified non-institutional (private) residence as the place of occurrence of the external cause: Secondary | ICD-10-CM

## 2022-04-14 DIAGNOSIS — N390 Urinary tract infection, site not specified: Secondary | ICD-10-CM | POA: Diagnosis present

## 2022-04-14 DIAGNOSIS — Z83719 Family history of colon polyps, unspecified: Secondary | ICD-10-CM

## 2022-04-14 DIAGNOSIS — E7800 Pure hypercholesterolemia, unspecified: Secondary | ICD-10-CM | POA: Diagnosis present

## 2022-04-14 DIAGNOSIS — I10 Essential (primary) hypertension: Secondary | ICD-10-CM | POA: Diagnosis present

## 2022-04-14 DIAGNOSIS — Z8249 Family history of ischemic heart disease and other diseases of the circulatory system: Secondary | ICD-10-CM

## 2022-04-14 DIAGNOSIS — K219 Gastro-esophageal reflux disease without esophagitis: Secondary | ICD-10-CM | POA: Diagnosis present

## 2022-04-14 DIAGNOSIS — Z7984 Long term (current) use of oral hypoglycemic drugs: Secondary | ICD-10-CM

## 2022-04-14 DIAGNOSIS — T50995A Adverse effect of other drugs, medicaments and biological substances, initial encounter: Secondary | ICD-10-CM | POA: Diagnosis present

## 2022-04-14 DIAGNOSIS — E860 Dehydration: Secondary | ICD-10-CM | POA: Diagnosis present

## 2022-04-14 DIAGNOSIS — M810 Age-related osteoporosis without current pathological fracture: Secondary | ICD-10-CM | POA: Diagnosis present

## 2022-04-14 DIAGNOSIS — E871 Hypo-osmolality and hyponatremia: Secondary | ICD-10-CM | POA: Diagnosis not present

## 2022-04-14 DIAGNOSIS — U071 COVID-19: Secondary | ICD-10-CM | POA: Diagnosis not present

## 2022-04-14 DIAGNOSIS — E119 Type 2 diabetes mellitus without complications: Secondary | ICD-10-CM | POA: Diagnosis present

## 2022-04-14 DIAGNOSIS — Z87891 Personal history of nicotine dependence: Secondary | ICD-10-CM

## 2022-04-14 DIAGNOSIS — T502X5A Adverse effect of carbonic-anhydrase inhibitors, benzothiadiazides and other diuretics, initial encounter: Secondary | ICD-10-CM | POA: Diagnosis present

## 2022-04-14 DIAGNOSIS — Z6831 Body mass index (BMI) 31.0-31.9, adult: Secondary | ICD-10-CM

## 2022-04-14 DIAGNOSIS — E669 Obesity, unspecified: Secondary | ICD-10-CM | POA: Diagnosis present

## 2022-04-14 DIAGNOSIS — R197 Diarrhea, unspecified: Secondary | ICD-10-CM | POA: Diagnosis present

## 2022-04-14 DIAGNOSIS — Z85828 Personal history of other malignant neoplasm of skin: Secondary | ICD-10-CM

## 2022-04-14 DIAGNOSIS — G4700 Insomnia, unspecified: Secondary | ICD-10-CM | POA: Diagnosis present

## 2022-04-14 DIAGNOSIS — Z7982 Long term (current) use of aspirin: Secondary | ICD-10-CM

## 2022-04-14 DIAGNOSIS — I2510 Atherosclerotic heart disease of native coronary artery without angina pectoris: Secondary | ICD-10-CM | POA: Diagnosis present

## 2022-04-14 DIAGNOSIS — Z9049 Acquired absence of other specified parts of digestive tract: Secondary | ICD-10-CM

## 2022-04-14 LAB — CBC
HCT: 39.8 % (ref 36.0–46.0)
Hemoglobin: 14.2 g/dL (ref 12.0–15.0)
MCH: 27.4 pg (ref 26.0–34.0)
MCHC: 35.7 g/dL (ref 30.0–36.0)
MCV: 76.8 fL — ABNORMAL LOW (ref 80.0–100.0)
Platelets: 310 10*3/uL (ref 150–400)
RBC: 5.18 MIL/uL — ABNORMAL HIGH (ref 3.87–5.11)
RDW: 12.5 % (ref 11.5–15.5)
WBC: 6.7 10*3/uL (ref 4.0–10.5)
nRBC: 0 % (ref 0.0–0.2)

## 2022-04-14 LAB — COMPREHENSIVE METABOLIC PANEL
ALT: 28 U/L (ref 0–44)
AST: 23 U/L (ref 15–41)
Albumin: 4.9 g/dL (ref 3.5–5.0)
Alkaline Phosphatase: 105 U/L (ref 38–126)
Anion gap: 14 (ref 5–15)
BUN: 9 mg/dL (ref 8–23)
CO2: 25 mmol/L (ref 22–32)
Calcium: 9.4 mg/dL (ref 8.9–10.3)
Chloride: 85 mmol/L — ABNORMAL LOW (ref 98–111)
Creatinine, Ser: 0.82 mg/dL (ref 0.44–1.00)
GFR, Estimated: >60 mL/min (ref >60)
Glucose, Bld: 115 mg/dL — ABNORMAL HIGH (ref 70–99)
Potassium: 3 mmol/L — ABNORMAL LOW (ref 3.5–5.1)
Sodium: 124 mmol/L — ABNORMAL LOW (ref 135–145)
Total Bilirubin: 0.7 mg/dL (ref 0.3–1.2)
Total Protein: 7.8 g/dL (ref 6.5–8.1)

## 2022-04-14 LAB — LIPASE, BLOOD: Lipase: 10 U/L — ABNORMAL LOW (ref 11–51)

## 2022-04-14 MED ORDER — ONDANSETRON HCL 4 MG/2ML IJ SOLN
4.0000 mg | Freq: Once | INTRAMUSCULAR | Status: AC | PRN
  Administered 2022-04-14: 4 mg via INTRAVENOUS
  Filled 2022-04-14: qty 2

## 2022-04-14 NOTE — ED Triage Notes (Signed)
Pt tested positive for Covid 4 days ago and think the medicine that was prescribed is causing her to be sick. Pt states she has been vomiting and diarrhea x2 days. Unable to keep anything done. Denies fever.

## 2022-04-15 ENCOUNTER — Emergency Department (HOSPITAL_BASED_OUTPATIENT_CLINIC_OR_DEPARTMENT_OTHER): Payer: Medicare HMO

## 2022-04-15 ENCOUNTER — Encounter (HOSPITAL_COMMUNITY): Payer: Self-pay | Admitting: Internal Medicine

## 2022-04-15 ENCOUNTER — Inpatient Hospital Stay (HOSPITAL_BASED_OUTPATIENT_CLINIC_OR_DEPARTMENT_OTHER)
Admission: EM | Admit: 2022-04-15 | Discharge: 2022-04-17 | DRG: 640 | Disposition: A | Discharge status: Home / Self Care | Payer: Medicare HMO | Attending: Internal Medicine | Admitting: Internal Medicine

## 2022-04-15 DIAGNOSIS — E119 Type 2 diabetes mellitus without complications: Secondary | ICD-10-CM

## 2022-04-15 DIAGNOSIS — I2510 Atherosclerotic heart disease of native coronary artery without angina pectoris: Secondary | ICD-10-CM | POA: Diagnosis not present

## 2022-04-15 DIAGNOSIS — R197 Diarrhea, unspecified: Secondary | ICD-10-CM | POA: Diagnosis not present

## 2022-04-15 DIAGNOSIS — G4700 Insomnia, unspecified: Secondary | ICD-10-CM | POA: Diagnosis not present

## 2022-04-15 DIAGNOSIS — Y92009 Unspecified place in unspecified non-institutional (private) residence as the place of occurrence of the external cause: Secondary | ICD-10-CM | POA: Diagnosis not present

## 2022-04-15 DIAGNOSIS — Z6831 Body mass index (BMI) 31.0-31.9, adult: Secondary | ICD-10-CM | POA: Diagnosis not present

## 2022-04-15 DIAGNOSIS — I10 Essential (primary) hypertension: Secondary | ICD-10-CM | POA: Diagnosis not present

## 2022-04-15 DIAGNOSIS — R0602 Shortness of breath: Secondary | ICD-10-CM | POA: Diagnosis not present

## 2022-04-15 DIAGNOSIS — M810 Age-related osteoporosis without current pathological fracture: Secondary | ICD-10-CM | POA: Diagnosis not present

## 2022-04-15 DIAGNOSIS — U071 COVID-19: Secondary | ICD-10-CM | POA: Diagnosis not present

## 2022-04-15 DIAGNOSIS — E669 Obesity, unspecified: Secondary | ICD-10-CM | POA: Diagnosis not present

## 2022-04-15 DIAGNOSIS — E785 Hyperlipidemia, unspecified: Secondary | ICD-10-CM | POA: Diagnosis present

## 2022-04-15 DIAGNOSIS — T502X5A Adverse effect of carbonic-anhydrase inhibitors, benzothiadiazides and other diuretics, initial encounter: Secondary | ICD-10-CM | POA: Diagnosis not present

## 2022-04-15 DIAGNOSIS — Z85828 Personal history of other malignant neoplasm of skin: Secondary | ICD-10-CM | POA: Diagnosis not present

## 2022-04-15 DIAGNOSIS — Z7982 Long term (current) use of aspirin: Secondary | ICD-10-CM | POA: Diagnosis not present

## 2022-04-15 DIAGNOSIS — N390 Urinary tract infection, site not specified: Secondary | ICD-10-CM | POA: Diagnosis not present

## 2022-04-15 DIAGNOSIS — T50905A Adverse effect of unspecified drugs, medicaments and biological substances, initial encounter: Secondary | ICD-10-CM

## 2022-04-15 DIAGNOSIS — Z83719 Family history of colon polyps, unspecified: Secondary | ICD-10-CM | POA: Diagnosis not present

## 2022-04-15 DIAGNOSIS — Z7984 Long term (current) use of oral hypoglycemic drugs: Secondary | ICD-10-CM | POA: Diagnosis not present

## 2022-04-15 DIAGNOSIS — E871 Hypo-osmolality and hyponatremia: Secondary | ICD-10-CM

## 2022-04-15 DIAGNOSIS — E7800 Pure hypercholesterolemia, unspecified: Secondary | ICD-10-CM | POA: Diagnosis not present

## 2022-04-15 DIAGNOSIS — Z9049 Acquired absence of other specified parts of digestive tract: Secondary | ICD-10-CM | POA: Diagnosis not present

## 2022-04-15 DIAGNOSIS — E860 Dehydration: Secondary | ICD-10-CM | POA: Diagnosis not present

## 2022-04-15 DIAGNOSIS — Z8249 Family history of ischemic heart disease and other diseases of the circulatory system: Secondary | ICD-10-CM | POA: Diagnosis not present

## 2022-04-15 DIAGNOSIS — Z87891 Personal history of nicotine dependence: Secondary | ICD-10-CM | POA: Diagnosis not present

## 2022-04-15 DIAGNOSIS — T50995A Adverse effect of other drugs, medicaments and biological substances, initial encounter: Secondary | ICD-10-CM | POA: Diagnosis not present

## 2022-04-15 DIAGNOSIS — K219 Gastro-esophageal reflux disease without esophagitis: Secondary | ICD-10-CM | POA: Diagnosis not present

## 2022-04-15 LAB — URINALYSIS, ROUTINE W REFLEX MICROSCOPIC
Bilirubin Urine: NEGATIVE
Glucose, UA: NEGATIVE mg/dL
Hgb urine dipstick: NEGATIVE
Ketones, ur: 40 mg/dL — AB
Nitrite: NEGATIVE
Protein, ur: 30 mg/dL — AB
Specific Gravity, Urine: 1.008 (ref 1.005–1.030)
pH: 6.5 (ref 5.0–8.0)

## 2022-04-15 LAB — BASIC METABOLIC PANEL
Anion gap: 11 (ref 5–15)
Anion gap: 11 (ref 5–15)
BUN: 8 mg/dL (ref 8–23)
BUN: 5 mg/dL — ABNORMAL LOW (ref 8–23)
CO2: 24 mmol/L (ref 22–32)
CO2: 23 mmol/L (ref 22–32)
Calcium: 8.9 mg/dL (ref 8.9–10.3)
Calcium: 9.1 mg/dL (ref 8.9–10.3)
Chloride: 88 mmol/L — ABNORMAL LOW (ref 98–111)
Chloride: 96 mmol/L — ABNORMAL LOW (ref 98–111)
Creatinine, Ser: 0.71 mg/dL (ref 0.44–1.00)
Creatinine, Ser: 0.8 mg/dL (ref 0.44–1.00)
GFR, Estimated: >60 mL/min (ref >60)
GFR, Estimated: >60 mL/min (ref >60)
Glucose, Bld: 143 mg/dL — ABNORMAL HIGH (ref 70–99)
Glucose, Bld: 133 mg/dL — ABNORMAL HIGH (ref 70–99)
Potassium: 3.1 mmol/L — ABNORMAL LOW (ref 3.5–5.1)
Potassium: 3.9 mmol/L (ref 3.5–5.1)
Sodium: 123 mmol/L — ABNORMAL LOW (ref 135–145)
Sodium: 130 mmol/L — ABNORMAL LOW (ref 135–145)

## 2022-04-15 LAB — HIV ANTIBODY (ROUTINE TESTING W REFLEX): HIV Screen 4th Generation wRfx: NONREACTIVE

## 2022-04-15 MED ORDER — GUAIFENESIN-DM 100-10 MG/5ML PO SYRP
10.0000 mL | ORAL_SOLUTION | ORAL | Status: DC | PRN
  Filled 2022-04-15: qty 10

## 2022-04-15 MED ORDER — ENOXAPARIN SODIUM 40 MG/0.4ML IJ SOSY
40.0000 mg | PREFILLED_SYRINGE | INTRAMUSCULAR | Status: DC
  Administered 2022-04-15 – 2022-04-16 (×2): 40 mg via SUBCUTANEOUS
  Filled 2022-04-15 (×2): qty 0.4

## 2022-04-15 MED ORDER — LOSARTAN POTASSIUM 50 MG PO TABS
100.0000 mg | ORAL_TABLET | Freq: Every day | ORAL | Status: DC
  Administered 2022-04-15: 100 mg via ORAL
  Filled 2022-04-15: qty 2

## 2022-04-15 MED ORDER — ONDANSETRON HCL 4 MG/2ML IJ SOLN
4.0000 mg | Freq: Once | INTRAMUSCULAR | Status: AC
  Administered 2022-04-15: 4 mg via INTRAVENOUS
  Filled 2022-04-15: qty 2

## 2022-04-15 MED ORDER — FLUTICASONE PROPIONATE 50 MCG/ACT NA SUSP: 2.0000 | Freq: Every day | NASAL | Status: DC | PRN

## 2022-04-15 MED ORDER — SODIUM CHLORIDE 0.9% FLUSH
3.0000 mL | Freq: Two times a day (BID) | INTRAVENOUS | Status: DC
  Administered 2022-04-15 (×2): 3 mL via INTRAVENOUS

## 2022-04-15 MED ORDER — PANTOPRAZOLE SODIUM 40 MG PO TBEC
40.0000 mg | DELAYED_RELEASE_TABLET | Freq: Every day | ORAL | Status: DC
  Administered 2022-04-15 – 2022-04-17 (×3): 40 mg via ORAL
  Filled 2022-04-15 (×3): qty 1

## 2022-04-15 MED ORDER — ALBUTEROL SULFATE HFA 108 (90 BASE) MCG/ACT IN AERS: 2.0000 | INHALATION_SPRAY | RESPIRATORY_TRACT | Status: DC | PRN

## 2022-04-15 MED ORDER — SODIUM CHLORIDE 0.9 % IV SOLN: INTRAVENOUS | Status: DC | PRN

## 2022-04-15 MED ORDER — ASPIRIN 325 MG PO TBEC
325.0000 mg | DELAYED_RELEASE_TABLET | Freq: Every day | ORAL | Status: DC
  Administered 2022-04-15 – 2022-04-17 (×3): 325 mg via ORAL
  Filled 2022-04-15 (×3): qty 1

## 2022-04-15 MED ORDER — SODIUM CHLORIDE 0.9 % IV BOLUS: 500.0000 mL | Freq: Once | INTRAVENOUS | Status: DC

## 2022-04-15 MED ORDER — MELATONIN 3 MG PO TABS
3.0000 mg | ORAL_TABLET | Freq: Every evening | ORAL | Status: DC | PRN
  Administered 2022-04-15: 3 mg via ORAL
  Filled 2022-04-15 (×2): qty 1

## 2022-04-15 MED ORDER — ONDANSETRON HCL 4 MG PO TABS: 4.0000 mg | ORAL_TABLET | Freq: Four times a day (QID) | ORAL | Status: DC | PRN

## 2022-04-15 MED ORDER — SODIUM CHLORIDE 0.9 % IV BOLUS: 250.0000 mL | Freq: Once | INTRAVENOUS | Status: DC

## 2022-04-15 MED ORDER — DROPERIDOL 2.5 MG/ML IJ SOLN
1.2500 mg | Freq: Once | INTRAMUSCULAR | Status: AC
  Administered 2022-04-15: 1.25 mg via INTRAVENOUS
  Filled 2022-04-15: qty 2

## 2022-04-15 MED ORDER — SODIUM CHLORIDE 0.9% FLUSH
3.0000 mL | Freq: Two times a day (BID) | INTRAVENOUS | Status: DC
  Administered 2022-04-15 – 2022-04-17 (×4): 3 mL via INTRAVENOUS

## 2022-04-15 MED ORDER — OXYCODONE HCL 5 MG PO TABS: 5.0000 mg | ORAL_TABLET | ORAL | Status: DC | PRN

## 2022-04-15 MED ORDER — PREDNISONE 5 MG PO TABS: 50.0000 mg | ORAL_TABLET | Freq: Every day | ORAL | Status: DC

## 2022-04-15 MED ORDER — ACETAMINOPHEN 325 MG PO TABS
650.0000 mg | ORAL_TABLET | Freq: Four times a day (QID) | ORAL | Status: DC | PRN
  Administered 2022-04-15 – 2022-04-16 (×2): 650 mg via ORAL
  Filled 2022-04-15 (×2): qty 2

## 2022-04-15 MED ORDER — POTASSIUM CHLORIDE CRYS ER 20 MEQ PO TBCR
40.0000 meq | EXTENDED_RELEASE_TABLET | Freq: Once | ORAL | Status: AC
  Administered 2022-04-15: 40 meq via ORAL
  Filled 2022-04-15: qty 2

## 2022-04-15 MED ORDER — METHYLPREDNISOLONE SODIUM SUCC 40 MG IJ SOLR
0.5000 mg/kg | Freq: Two times a day (BID) | INTRAMUSCULAR | Status: DC
  Administered 2022-04-15 (×2): 38 mg via INTRAVENOUS
  Filled 2022-04-15 (×2): qty 1

## 2022-04-15 MED ORDER — SODIUM CHLORIDE 0.9 % IV BOLUS
500.0000 mL | Freq: Once | INTRAVENOUS | Status: AC
  Administered 2022-04-15: 500 mL via INTRAVENOUS

## 2022-04-15 MED ORDER — SODIUM CHLORIDE 0.9% FLUSH: 3.0000 mL | INTRAVENOUS | Status: DC | PRN

## 2022-04-15 MED ORDER — METOPROLOL TARTRATE 12.5 MG HALF TABLET
12.5000 mg | ORAL_TABLET | Freq: Every day | ORAL | Status: DC
  Administered 2022-04-15 – 2022-04-16 (×2): 12.5 mg via ORAL
  Filled 2022-04-15 (×2): qty 1

## 2022-04-15 MED ORDER — SODIUM CHLORIDE 0.9 % IV SOLN: INTRAVENOUS | Status: DC

## 2022-04-15 MED ORDER — METOPROLOL TARTRATE 12.5 MG HALF TABLET: 12.5000 mg | ORAL_TABLET | ORAL | Status: DC

## 2022-04-15 MED ORDER — METOPROLOL TARTRATE 25 MG PO TABS
25.0000 mg | ORAL_TABLET | Freq: Every day | ORAL | Status: DC
  Administered 2022-04-15 – 2022-04-17 (×3): 25 mg via ORAL
  Filled 2022-04-15 (×3): qty 1

## 2022-04-15 MED ORDER — ROSUVASTATIN CALCIUM 20 MG PO TABS
20.0000 mg | ORAL_TABLET | Freq: Every evening | ORAL | Status: DC
  Administered 2022-04-15 – 2022-04-16 (×2): 20 mg via ORAL
  Filled 2022-04-15 (×2): qty 1

## 2022-04-15 MED ORDER — VITAMIN C 500 MG PO TABS
500.0000 mg | ORAL_TABLET | Freq: Every day | ORAL | Status: DC
  Administered 2022-04-15 – 2022-04-17 (×3): 500 mg via ORAL
  Filled 2022-04-15 (×3): qty 1

## 2022-04-15 MED ORDER — INSULIN ASPART 100 UNIT/ML IJ SOLN
0.0000 [IU] | Freq: Three times a day (TID) | INTRAMUSCULAR | Status: DC
  Administered 2022-04-16: 2 [IU] via SUBCUTANEOUS
  Administered 2022-04-16 (×2): 3 [IU] via SUBCUTANEOUS

## 2022-04-15 MED ORDER — DOXEPIN HCL 10 MG PO CAPS: 10.0000 mg | ORAL_CAPSULE | Freq: Every day | ORAL | Status: DC

## 2022-04-15 MED ORDER — ONDANSETRON HCL 4 MG/2ML IJ SOLN: 4.0000 mg | Freq: Four times a day (QID) | INTRAMUSCULAR | Status: DC | PRN

## 2022-04-15 MED ORDER — ZINC SULFATE 220 (50 ZN) MG PO CAPS
220.0000 mg | ORAL_CAPSULE | Freq: Every day | ORAL | Status: DC
  Administered 2022-04-15 – 2022-04-17 (×3): 220 mg via ORAL
  Filled 2022-04-15 (×3): qty 1

## 2022-04-15 MED ORDER — HYDROCOD POLI-CHLORPHE POLI ER 10-8 MG/5ML PO SUER: 5.0000 mL | Freq: Two times a day (BID) | ORAL | Status: DC | PRN

## 2022-04-15 MED ORDER — TRIAZOLAM 0.25 MG PO TABS: 0.2500 mg | ORAL_TABLET | Freq: Every day | ORAL | Status: DC

## 2022-04-15 NOTE — ED Provider Notes (Signed)
[place] EMERGENCY DEPT Provider Note   CSN: [id number] Arrival date & time: 04/14/22  2049     History  Chief Complaint  Patient presents with   Emesis   Diarrhea    [name] is a 66 y.o. female.  The history is provided by the patient.  Emesis Severity:  Moderate Duration:  4 days Timing:  Intermittent Quality:  Stomach contents Progression:  Unchanged Chronicity:  New Recent urination:  Normal Context: not post-tussive   Relieved by:  Nothing Worsened by:  Nothing Ineffective treatments:  None tried Associated symptoms: diarrhea   Associated symptoms: no fever   Associated symptoms comment:  Lightheaded  Risk factors: no alcohol use   Risk factors comment:  Covid 19 started on antibiotics and antivirals Diarrhea Quality:  Watery Severity:  Moderate Duration:  4 days Timing:  Intermittent Progression:  Unchanged Relieved by:  Nothing Worsened by:  Nothing Ineffective treatments:  None tried Associated symptoms: vomiting   Associated symptoms: no fever   Risk factors: recent antibiotic use   Risk factors comment:  Covid 19 Patient started on antivirals for covid and antibiotics for ear related to covid.       Home Medications Prior to Admission medications   Medication Sig Start Date End Date Taking? Authorizing Provider  aspirin EC 325 MG EC tablet Take 1 tablet (325 mg total) by mouth daily. 01/11/17   Geradine Girt, DO  clobetasol cream (TEMOVATE) 2.37 % Apply 1 application. topically 2 (two) times daily. 09/07/21   Lavonna Monarch, MD  clobetasol ointment (TEMOVATE) 0.05 % Apply to legs nightly 30 days 01/04/21   Lavonna Monarch, MD  dexlansoprazole (DEXILANT) 60 MG capsule Take 60 mg by mouth daily.    [provider]  doxepin (SINEQUAN) 10 MG capsule TAKE ONE CAPSULE BY MOUTH AT BEDTIME 06/07/20   Lavonna Monarch, MD  fluticasone (FLONASE) 50 MCG/ACT nasal spray Place 2 sprays into both nostrils daily.    [provider]  halobetasol (ULTRAVATE) 0.05 % cream Apply topically daily. After bathing to wet skin. 05/24/20   Lavonna Monarch, MD  hydrochlorothiazide (HYDRODIURIL) 25 MG tablet Take 25 mg by mouth daily.    [provider]  ibuprofen (ADVIL) 800 MG tablet Take 800 mg by mouth 2 (two) times daily with a meal.    [provider]  Ibuprofen-Famotidine 800-26.6 MG TABS Duexis 800 mg-26.6 mg tablet  Take 1 tablet 3 times a day by oral route.    [provider]  losartan (COZAAR) 100 MG tablet Take 100 mg by mouth daily.    [provider]  metFORMIN (GLUCOPHAGE) 500 MG tablet Take 500 mg by mouth 2 (two) times daily with a meal.    [provider]  metoprolol tartrate (LOPRESSOR) 25 MG tablet Take 0.5 tablets (12.5 mg total) by mouth 2 (two) times daily. Patient taking differently: Take 12.5-25 mg by mouth See admin instructions. 25 mg in the morning 12.5 mg in the evening 04/20/16   End, Harrell Gave, MD  rosuvastatin (CRESTOR) 20 MG tablet Take 1 tablet (20 mg total) by mouth every evening. 01/10/17   Geradine Girt, DO  tacrolimus (PROTOPIC) 0.1 % ointment Apply topically 2 (two) times daily. 09/07/21   Lavonna Monarch, MD  triazolam (HALCION) 0.25 MG tablet Take 0.25 mg by mouth at bedtime.    [provider]      Allergies    Codeine    Review of Systems   Review of Systems  Constitutional:  Negative for fever.  HENT:  Negative for facial swelling.   Eyes:  Negative for redness.  Respiratory:  Negative for wheezing and stridor.   Cardiovascular:  Negative for chest pain.  Gastrointestinal:  Positive for diarrhea, nausea and vomiting.  Neurological:  Positive for light-headedness.  All other systems reviewed and are negative.   Physical Exam Updated Vital Signs BP (!) 145/65 (BP Location: Right Arm)   Pulse 61   Temp 98 F (36.7 C) (Oral)   Resp 18   Ht '5\' 1"'$  (1.549 m)   Wt 76.2 kg   SpO2 98%   BMI 31.74 kg/m  Physical  Exam Vitals and nursing note reviewed.  Constitutional:      General: She is not in acute distress.    Appearance: Normal appearance. She is well-developed.  HENT:     Head: Normocephalic and atraumatic.     Nose: Nose normal.  Eyes:     Pupils: Pupils are equal, round, and reactive to light.  Cardiovascular:     Rate and Rhythm: Normal rate and regular rhythm.     Pulses: Normal pulses.     Heart sounds: Normal heart sounds.  Pulmonary:     Effort: Pulmonary effort is normal. No respiratory distress.     Breath sounds: Normal breath sounds.  Abdominal:     General: Bowel sounds are normal. There is no distension.     Palpations: Abdomen is soft.     Tenderness: There is no abdominal tenderness. There is no guarding or rebound.  Genitourinary:    Vagina: No vaginal discharge.  Musculoskeletal:        General: Normal range of motion.     Cervical back: Normal range of motion and neck supple.  Skin:    General: Skin is warm and dry.     Capillary Refill: Capillary refill takes less than 2 seconds.     Findings: No erythema or rash.  Neurological:     General: No focal deficit present.     Mental Status: She is alert and oriented to person, place, and time.     Deep Tendon Reflexes: Reflexes normal.  Psychiatric:        Mood and Affect: Mood normal.     ED Results / Procedures / Treatments   Labs (all labs ordered are listed, but only abnormal results are displayed) Results for orders placed or performed during the hospital encounter of 04/15/22  Lipase, blood  Result Value Ref Range   Lipase 10 (L) 11 - 51 U/L  Comprehensive metabolic panel  Result Value Ref Range   Sodium 124 (L) 135 - 145 mmol/L   Potassium 3.0 (L) 3.5 - 5.1 mmol/L   Chloride 85 (L) 98 - 111 mmol/L   CO2 25 22 - 32 mmol/L   Glucose, Bld 115 (H) 70 - 99 mg/dL   BUN 9 8 - 23 mg/dL   Creatinine, Ser 0.82 0.44 - 1.00 mg/dL   Calcium 9.4 8.9 - 10.3 mg/dL   Total Protein 7.8 6.5 - 8.1 g/dL   Albumin  4.9 3.5 - 5.0 g/dL   AST 23 15 - 41 U/L   ALT 28 0 - 44 U/L   Alkaline Phosphatase 105 38 - 126 U/L   Total Bilirubin 0.7 0.3 - 1.2 mg/dL   GFR, Estimated >60 >60 mL/min   Anion gap 14 5 - 15  CBC  Result Value Ref Range   WBC 6.7 4.0 - 10.5 K/uL  RBC 5.18 (H) 3.87 - 5.11 MIL/uL   Hemoglobin 14.2 12.0 - 15.0 g/dL   HCT 39.8 36.0 - 46.0 %   MCV 76.8 (L) 80.0 - 100.0 fL   MCH 27.4 26.0 - 34.0 pg   MCHC 35.7 30.0 - 36.0 g/dL   RDW 12.5 11.5 - 15.5 %   Platelets 310 150 - 400 K/uL   nRBC 0.0 0.0 - 0.2 %  Urinalysis, Routine w reflex microscopic Urine, Clean Catch  Result Value Ref Range   Color, Urine YELLOW YELLOW   APPearance CLEAR CLEAR   Specific Gravity, Urine 1.008 1.005 - 1.030   pH 6.5 5.0 - 8.0   Glucose, UA NEGATIVE NEGATIVE mg/dL   Hgb urine dipstick NEGATIVE NEGATIVE   Bilirubin Urine NEGATIVE NEGATIVE   Ketones, ur 40 (A) NEGATIVE mg/dL   Protein, ur 30 (A) NEGATIVE mg/dL   Nitrite NEGATIVE NEGATIVE   Leukocytes,Ua TRACE (A) NEGATIVE   RBC / HPF 0-5 0 - 5 RBC/hpf   WBC, UA 6-10 0 - 5 WBC/hpf   Bacteria, UA RARE (A) NONE SEEN   Squamous Epithelial / LPF 0-5 0 - 5   Mucus PRESENT   Basic metabolic panel  Result Value Ref Range   Sodium 123 (L) 135 - 145 mmol/L   Potassium 3.1 (L) 3.5 - 5.1 mmol/L   Chloride 88 (L) 98 - 111 mmol/L   CO2 24 22 - 32 mmol/L   Glucose, Bld 143 (H) 70 - 99 mg/dL   BUN 8 8 - 23 mg/dL   Creatinine, Ser 0.71 0.44 - 1.00 mg/dL   Calcium 8.9 8.9 - 10.3 mg/dL   GFR, Estimated >60 >60 mL/min   Anion gap 11 5 - 15   DG Chest Portable 1 View  Result Date: 04/15/2022 CLINICAL DATA:  COVID EXAM: PORTABLE CHEST 1 VIEW COMPARISON:  01/09/2016 FINDINGS: Heart and mediastinal contours are within normal limits. No focal opacities or effusions. No acute bony abnormality. IMPRESSION: No active disease. Electronically Signed   By: Rolm Baptise M.D.   On: 04/15/2022 02:10     EKG None  Radiology DG Chest Portable 1 View  Result Date:  04/15/2022 CLINICAL DATA:  COVID EXAM: PORTABLE CHEST 1 VIEW COMPARISON:  01/09/2016 FINDINGS: Heart and mediastinal contours are within normal limits. No focal opacities or effusions. No acute bony abnormality. IMPRESSION: No active disease. Electronically Signed   By: Rolm Baptise M.D.   On: 04/15/2022 02:10    Procedures Procedures    Medications Ordered in ED Medications  0.9 %  sodium chloride infusion (0 mLs Intravenous Stopped 04/15/22 0320)  sodium chloride 0.9 % bolus 250 mL ( Intravenous Rate/Dose Change 04/15/22 0328)  ondansetron (ZOFRAN) injection 4 mg (4 mg Intravenous Given 04/14/22 2107)  ondansetron (ZOFRAN) injection 4 mg (4 mg Intravenous Given 04/15/22 0110)  sodium chloride 0.9 % bolus 500 mL (0 mLs Intravenous Stopped 04/15/22 0140)  potassium chloride SA (KLOR-CON M) CR tablet 40 mEq (40 mEq Oral Given 04/15/22 0214)    ED Course/ Medical Decision Making/ A&P                           Medical Decision Making Patient with n/v/d and lightheadedness with covid on antivirals   Amount and/or Complexity of Data Reviewed External Data Reviewed: notes.    Details: Previous notes reviewed  Labs: ordered.    Details: All labs reviewed: normal lipase.  Sodium low x 2 124 and  123.  Potassium low 3.0 normal creatinine.  Normal white count 6.7 normal hemoglobin 14.2 normal platelet count.   Radiology: ordered and independent interpretation performed.    Details: Negative cxr by me  ECG/medicine tests: ordered and independent interpretation performed. Decision-making details documented in ED Course. Discussion of management or test interpretation with external provider(s): Dr. Velia Meyer who will admit   Risk Prescription drug management. Decision regarding hospitalization.    Final Clinical Impression(s) / ED Diagnoses Final diagnoses:  COVID-19  Hyponatremia  Adverse effect of drug, initial encounter   The patient appears reasonably stabilized for admission considering  the current resources, flow, and capabilities available in the ED at this time, and I doubt any other [facility] requiring further screening and/or treatment in the ED prior to admission.  Rx / DC Orders ED Discharge Orders     None         Safwan Tomei, MD 04/15/22 0511

## 2022-04-15 NOTE — H&P (Signed)
History and Physical    Patient: [name] IWL:[id number] DOB: January 18, 1956 DOA: 04/15/2022 DOS: the patient was seen and examined on 04/15/2022 PCP: Marda Stalker, PA-C  Patient coming from: Home - lives with husband, daughter, grandson; NOK: Husband, [name]   Chief Complaint: n/v/d  HPI: [name] is a 66 y.o. female with medical history significant of HTN and HLD presenting with n/v/d.   She reports that she had sore throat, headache, fatigue on Monday and went to have a COVID test that returned positive.  On Wednesday, she developed n/v/d that has persisted.  She was taking molnupiravir and thought it was related to that, took 4 days' worth of therapy and none after.  Last GI symptoms were overnight.  She appears to be feeling better at this time.  No respiratory symptoms.    ER Course:  [facility] transfer, per Dr. Velia Meyer:  Recurrent N/V over the last 5 days, with very limited po intake over that timeframe. Also with headache, generalized myalgias as well as loose stool, diagnosed with COVID-19 early last week, prompting initiation of a 5-day course of molnupiravir, with day 1 occurring on 11/27.  She notes that shortly after her first dose of molnupiravir, that she developed nausea/vomiting and has been experiencing at least 3-4 such episodes of nonbloody, nonbilious emesis every day since then.  In spite of this, she reports that she continued to adhere to the twice daily dosing associated with this medication, completing 4 out of 5 days of this prescription.  She notes that date 5 discourse was intended to be Friday, 04/14/2022, but she could not bring herself to take any more doses of this medication given the persistent nausea/vomiting, and she is subsequently presented to [facility] for further evaluation and management thereof.  She notes some mild dizziness lightheadedness.  No respiratory symptoms.  Prior to diagnosis of COVID, she had also  completed a recent course of doxycycline for otitis media.   Sodium 124, 136 in March 2023.  She continues to experience persistent nausea/vomiting and [facility] ED and spite of multiple doses of IV antiemetics, and is unable to tolerate p.o. at this time.     Review of Systems: As mentioned in the history of present illness. All other systems reviewed and are negative. 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 Her smoking use included cigarettes. She has a 87.50 pack-year smoking history. She has never used smokeless tobacco. She reports that she does not drink alcohol and does not use drugs.  Allergies  Allergen Reactions   Codeine Nausea And Vomiting    Family History  Problem Relation Age of Onset   Microcephaly Father    CAD Father    Heart disease Sister        atrial fibrillation   Heart attack Brother        died 54   Colon polyps Mother    Hypertension Mother    Colon cancer Neg Hx    Liver  disease Neg Hx     Prior to Admission medications   Medication Sig Start Date End Date Taking? Authorizing Provider  aspirin EC 325 MG EC tablet Take 1 tablet (325 mg total) by mouth daily. 01/11/17   Geradine Girt, DO  clobetasol cream (TEMOVATE) 4.40 % Apply 1 application. topically 2 (two) times daily. 09/07/21   Lavonna Monarch, MD  clobetasol ointment (TEMOVATE) 0.05 % Apply to legs nightly 30 days 01/04/21   Lavonna Monarch, MD  dexlansoprazole (DEXILANT) 60 MG capsule Take 60 mg by mouth daily.    [provider]  doxepin (SINEQUAN) 10 MG capsule TAKE ONE CAPSULE  BY MOUTH AT BEDTIME 06/07/20   Lavonna Monarch, MD  fluticasone (FLONASE) 50 MCG/ACT nasal spray Place 2 sprays into both nostrils daily.    [provider]  halobetasol (ULTRAVATE) 0.05 % cream Apply topically daily. After bathing to wet skin. 05/24/20   Lavonna Monarch, MD  hydrochlorothiazide (HYDRODIURIL) 25 MG tablet Take 25 mg by mouth daily.    [provider]  ibuprofen (ADVIL) 800 MG tablet Take 800 mg by mouth 2 (two) times daily with a meal.    [provider]  Ibuprofen-Famotidine 800-26.6 MG TABS Duexis 800 mg-26.6 mg tablet  Take 1 tablet 3 times a day by oral route.    [provider]  losartan (COZAAR) 100 MG tablet Take 100 mg by mouth daily.    [provider]  metFORMIN (GLUCOPHAGE) 500 MG tablet Take 500 mg by mouth 2 (two) times daily with a meal.    [provider]  metoprolol tartrate (LOPRESSOR) 25 MG tablet Take 0.5 tablets (12.5 mg total) by mouth 2 (two) times daily. Patient taking differently: Take 12.5-25 mg by mouth See admin instructions. 25 mg in the morning 12.5 mg in the evening 04/20/16   End, Harrell Gave, MD  rosuvastatin (CRESTOR) 20 MG tablet Take 1 tablet (20 mg total) by mouth every evening. 01/10/17   Geradine Girt, DO  tacrolimus (PROTOPIC) 0.1 % ointment Apply topically 2 (two) times daily. 09/07/21   Lavonna Monarch, MD  triazolam (HALCION) 0.25 MG tablet Take 0.25 mg by mouth at bedtime.    [provider]    Physical Exam: Vitals:   04/15/22 0445 04/15/22 0500 04/15/22 0515 04/15/22 0859  BP:  (!) 140/91 (!) 176/83 (!) 144/81  Pulse: 72 68 68 72  Resp:    14  Temp:    97.8 F (36.6 C)  TempSrc:    Oral  SpO2: 96% 95% 95% 95%  Weight:      Height:       General:  Appears calm and comfortable and is in NAD Eyes:  EOMI, normal lids, iris ENT:  grossly normal hearing, lips & tongue, mmm Neck:  no LAD, masses or thyromegaly Cardiovascular:  RRR, no m/r/g. No LE edema.  Respiratory:    CTA bilaterally with no wheezes/rales/rhonchi.  Normal respiratory effort. Abdomen:  soft, NT, ND Skin:  no rash or induration seen on limited exam Musculoskeletal:  grossly normal tone BUE/BLE, good ROM, no bony abnormality Psychiatric: blunted mood and affect, speech fluent and appropriate, AOx3 Neurologic:  CN 2-12 grossly intact, moves all extremities in coordinated fashion   Radiological Exams on Admission: Independently reviewed - see discussion in A/P where applicable  DG Chest Portable 1 View  Result Date: 04/15/2022 CLINICAL DATA:  COVID EXAM: PORTABLE CHEST 1 VIEW COMPARISON:  01/09/2016 FINDINGS: Heart and mediastinal contours are within normal limits. No focal  opacities or effusions. No acute bony abnormality. IMPRESSION: No active disease. Electronically Signed   By: Rolm Baptise M.D.   On: 04/15/2022 02:10    EKG: Independently reviewed.  NSR with rate 67; no evidence of acute ischemia   Labs on Admission: I have personally reviewed the available labs and imaging studies at the time of the admission.  Pertinent labs:    Na++ 124 -> 123 -> 130 K+ 3.0 -> 3.9 Unremarkable CBC UA: 40 ketones, trace LE, 30 protein, rare bacteria A1c 5.9 on 08/09/21   Assessment and Plan: Principal Problem:   COVID-19 virus infection Active Problems:   HLD (hyperlipidemia)   Essential hypertension   Acute hyponatremia   Diabetes mellitus type 2, controlled, without complications ([place])    COVID Infection -Patient with presenting with primarily GI symptoms in the setting of COVID-19; this may be from the COVID infection, the molnupiravir, or both -She does not have an O2 requirement  -COVID POSITIVE -Will admit for further evaluation, close monitoring, and treatment -Monitor on telemetry x at least 24 hours -At this time, will attempt to avoid use of aerosolized medications and use HFAs instead -Will order steroids, as this may improve the inflammatory symptoms associated with  COVID -Encourage mobilization/ambulation as much as possible -Patient was seen wearing full PPE including: gown, gloves, N95, and eye wear; donning and doffing was in compliance with current standards.  Hyponatremia -Etiology appears to be hypovolemic hyponatremia associated with n/v/d in the setting of COVID -Sodium increased from 124 to 130 based on boluses/IVF received in ER.   -Will continue IVF overnight  HTN -Continue losartan, metoprolol -Hold HCTZ  HLD -Continue rosuvastatin  CAD -Continue ASA  DM -Prior A1c was 5.9, indicating good control -hold Glucophage -Cover with moderate-scale SSI   Obesity Body mass index is 31.74 kg/m.  -Weight loss should be encouraged -Outpatient PCP/bariatric medicine f/u encouraged      Advance Care Planning:   Code Status: Full Code   Consults: None  DVT Prophylaxis: Lovenox  Family Communication: None present; she is capable of communicating with family at this time  Severity of Illness: The appropriate patient status for this patient is INPATIENT. Inpatient status is judged to be reasonable and necessary in order to provide the required intensity of service to ensure the patient's safety. The patient's presenting symptoms, physical exam findings, and initial radiographic and laboratory data in the context of their chronic comorbidities is felt to place them at high risk for further clinical deterioration. Furthermore, it is not anticipated that the patient will be medically stable for discharge from the hospital within 2 midnights of admission.   * I certify that at the point of admission it is my clinical judgment that the patient will require inpatient hospital care spanning beyond 2 midnights from the point of admission due to high intensity of service, high risk for further deterioration and high frequency of surveillance required.*  Author: Karmen Bongo, MD 04/15/2022 7:11 PM  For on call review [email or web address].

## 2022-04-15 NOTE — Progress Notes (Signed)
Hospitalist Transfer Note:  Transferring facility: [facility] Requesting provider: Dr. Randal Buba (EDP at [facility]) Reason for transfer: admission for further evaluation and management of acute hyponatremia, with intractable n/v.      66  year old female with medical history notable for HTN, HLD, who presented to [facility] ED complaining of recurrent N/V over the last 5 days, with very limited po intake over that timeframe.   She reports that in the setting of headache, generalized myalgias as well as loose stool that she was diagnosed with COVID-19 early last week, prompting initiation of a 5-day course of molnupiravir, with day 1 occurring on 11/27.  She notes that shortly after her first dose of molnupiravir, that she developed nausea/vomiting and has been experiencing at least 3-4 such episodes of nonbloody, nonbilious emesis every day since then.  In spite of this, she reports that she continued to adhere to the twice daily dosing associated with this medication, completing 4 out of 5 days of this prescription.  She notes that date 5 discourse was intended to be Friday, 04/14/2022, but she could not bring herself to take any more doses of this medication given the persistent nausea/vomiting, and she is subsequently presented to [facility] for further evaluation and management thereof.  She notes some mild dizziness lightheadedness, but in the absence of vertigo.  Otherwise, no new reported acute focal neurologic deficits.  She notes that she has not experienced any acute respiratory symptoms throughout the above course.  Prior to diagnosis of COVID, she had also completed a recent course of doxycycline for otitis media.  Presenting serum sodium noted to be 124.  This was relative to 136 in March 2023.  She continues to experience persistent nausea/vomiting and [facility] ED and spite of multiple doses of IV antiemetics, and is unable to tolerate p.o. at this time.  Subsequently, I accepted this patient for transfer  for inpatient admission to a med-tele bed at [facility] for further work-up and management of the above .        Check [email or web address] for on-call coverage.   Nursing staff, Please call [place] number on Amion as soon as patient's arrival, so appropriate admitting provider can evaluate the pt.     Babs Bertin, DO Hospitalist

## 2022-04-15 NOTE — Progress Notes (Addendum)
New Admission Note:  Arrival Method: By [facility] around [phone] Mental Orientation: Alert and oriented x 4 Telemetry: Yes, CCMD notified Assessment: Completed Skin: Completed, Left knee abrasion, face is red IV: Left AC Pain: Denies Tubes: None Safety Measures: Safety Fall Prevention Plan was given, discussed and signed. Admission: Completed [facility] Orientation: Patient has been orientated to the room, unit and the staff. Family: None  Orders have been reviewed and implemented. Will continue to monitor the patient. Call light has been placed within reach and bed alarm has been activated. Pt was placed on Enteric and Airborne precautions.  Fara Olden, RN  Phone Number: [phone]

## 2022-04-15 NOTE — Progress Notes (Signed)
[facility] night cross cover note:   I was notified by RN of the patient's request for a sleep aid. I subsequently placed order for prn melatonin for insomnia.     Tian Davison, DO Hospitalist  

## 2022-04-16 ENCOUNTER — Inpatient Hospital Stay (HOSPITAL_COMMUNITY): Payer: Medicare HMO

## 2022-04-16 DIAGNOSIS — U071 COVID-19: Secondary | ICD-10-CM | POA: Diagnosis not present

## 2022-04-16 LAB — CBC WITH DIFFERENTIAL/PLATELET
Abs Immature Granulocytes: 0.03 10*3/uL (ref 0.00–0.07)
Basophils Absolute: 0 10*3/uL (ref 0.0–0.1)
Basophils Relative: 0 %
Eosinophils Absolute: 0 10*3/uL (ref 0.0–0.5)
Eosinophils Relative: 0 %
HCT: 38.5 % (ref 36.0–46.0)
Hemoglobin: 13.3 g/dL (ref 12.0–15.0)
Immature Granulocytes: 0 %
Lymphocytes Relative: 5 %
Lymphs Abs: 0.5 10*3/uL — ABNORMAL LOW (ref 0.7–4.0)
MCH: 27.3 pg (ref 26.0–34.0)
MCHC: 34.5 g/dL (ref 30.0–36.0)
MCV: 79.1 fL — ABNORMAL LOW (ref 80.0–100.0)
Monocytes Absolute: 0.3 10*3/uL (ref 0.1–1.0)
Monocytes Relative: 3 %
Neutro Abs: 9.7 10*3/uL — ABNORMAL HIGH (ref 1.7–7.7)
Neutrophils Relative %: 92 %
Platelets: 276 10*3/uL (ref 150–400)
RBC: 4.87 MIL/uL (ref 3.87–5.11)
RDW: 13 % (ref 11.5–15.5)
WBC: 10.6 10*3/uL — ABNORMAL HIGH (ref 4.0–10.5)
nRBC: 0 % (ref 0.0–0.2)

## 2022-04-16 LAB — URINALYSIS, ROUTINE W REFLEX MICROSCOPIC
Bilirubin Urine: NEGATIVE
Glucose, UA: NEGATIVE mg/dL
Ketones, ur: NEGATIVE mg/dL
Nitrite: NEGATIVE
Protein, ur: NEGATIVE mg/dL
Specific Gravity, Urine: 1.004 — ABNORMAL LOW (ref 1.005–1.030)
WBC, UA: >50 WBC/hpf — ABNORMAL HIGH (ref 0–5)
pH: 6 (ref 5.0–8.0)

## 2022-04-16 LAB — BASIC METABOLIC PANEL
Anion gap: 11 (ref 5–15)
BUN: 5 mg/dL — ABNORMAL LOW (ref 8–23)
CO2: 22 mmol/L (ref 22–32)
Calcium: 9.4 mg/dL (ref 8.9–10.3)
Chloride: 92 mmol/L — ABNORMAL LOW (ref 98–111)
Creatinine, Ser: 0.76 mg/dL (ref 0.44–1.00)
GFR, Estimated: >60 mL/min (ref >60)
Glucose, Bld: 164 mg/dL — ABNORMAL HIGH (ref 70–99)
Potassium: 3.8 mmol/L (ref 3.5–5.1)
Sodium: 125 mmol/L — ABNORMAL LOW (ref 135–145)

## 2022-04-16 LAB — MAGNESIUM: Magnesium: 1.7 mg/dL (ref 1.7–2.4)

## 2022-04-16 LAB — OSMOLALITY, URINE: Osmolality, Ur: 227 mOsm/kg — ABNORMAL LOW (ref 300–900)

## 2022-04-16 LAB — GLUCOSE, CAPILLARY
Glucose-Capillary: 163 mg/dL — ABNORMAL HIGH (ref 70–99)
Glucose-Capillary: 170 mg/dL — ABNORMAL HIGH (ref 70–99)
Glucose-Capillary: 123 mg/dL — ABNORMAL HIGH (ref 70–99)
Glucose-Capillary: 163 mg/dL — ABNORMAL HIGH (ref 70–99)

## 2022-04-16 LAB — BRAIN NATRIURETIC PEPTIDE: B Natriuretic Peptide: 39.2 pg/mL (ref 0.0–100.0)

## 2022-04-16 LAB — SODIUM, URINE, RANDOM: Sodium, Ur: <10 mmol/L

## 2022-04-16 LAB — CREATININE, URINE, RANDOM: Creatinine, Urine: 65 mg/dL

## 2022-04-16 LAB — C-REACTIVE PROTEIN: CRP: <0.5 mg/dL (ref <1.0)

## 2022-04-16 LAB — URIC ACID: Uric Acid, Serum: 3 mg/dL (ref 2.5–7.1)

## 2022-04-16 LAB — OSMOLALITY: Osmolality: 269 mOsm/kg — ABNORMAL LOW (ref 275–295)

## 2022-04-16 MED ORDER — METHYLPREDNISOLONE SODIUM SUCC 125 MG IJ SOLR: 80.0000 mg | Freq: Every day | INTRAMUSCULAR | Status: DC

## 2022-04-16 MED ORDER — TRIAZOLAM 0.25 MG PO TABS
0.2500 mg | ORAL_TABLET | Freq: Every evening | ORAL | Status: AC | PRN
  Administered 2022-04-16: 0.25 mg via ORAL
  Filled 2022-04-16: qty 1

## 2022-04-16 MED ORDER — CEPHALEXIN 500 MG PO CAPS
500.0000 mg | ORAL_CAPSULE | Freq: Three times a day (TID) | ORAL | Status: DC
  Administered 2022-04-16 – 2022-04-17 (×2): 500 mg via ORAL
  Filled 2022-04-16 (×2): qty 1

## 2022-04-16 MED ORDER — METHYLPREDNISOLONE SODIUM SUCC 40 MG IJ SOLR
40.0000 mg | Freq: Every day | INTRAMUSCULAR | Status: DC
  Administered 2022-04-16 – 2022-04-17 (×2): 40 mg via INTRAVENOUS
  Filled 2022-04-16 (×2): qty 1

## 2022-04-16 NOTE — Progress Notes (Signed)
PROGRESS NOTE                                                                                                                                                                                                             Patient Demographics:    [name], is a 66 y.o. female, DOB - [birth year], DSK:[id number]  Outpatient Primary MD for the patient is Marda Stalker, PA-C    LOS - 1  Admit date - 04/15/2022    Chief Complaint  Patient presents with   Emesis   Diarrhea       Brief Narrative (HPI from H&P)    66 y.o. female with medical history significant of HTN and HLD presenting with n/v/d.   She reports that she had sore throat, headache, fatigue on Monday and went to have a COVID test that returned positive.  On Wednesday, she developed n/v/d that has persisted.  She was taking molnupiravir and thought it was related to that, took 4 days' worth of therapy and none after. In the ER diagnosed with dehydration and Hyponatremia.   Subjective:    [name] today has, No headache, No chest pain, No abdominal pain - No Nausea, No new weakness tingling or numbness, no SOB.   Assessment  & Plan :    COVID Infection -Patient with presenting with primarily GI symptoms in the setting of COVID-19; this may be from the COVID infection, the molnupiravir, or both, she has no pulmonary symptoms chest x-ray is clear, she has 2 COVID vaccines so far, continue to monitor closely with supportive care monitor inflammatory markers, rapidly tapered down steroids.  Courage to sit in chair in the daytime use I-S and flutter valve for pulmonary toiletry.   Hyponatremia - -Etiology appears to be dehydration in the setting of recent nausea vomiting and diarrhea, sodium is still quite low continue IV fluids check urine osmolality, urine electrolytes along with serum osmolality and uric acid.   HTN -Continue losartan, metoprolol, Hold HCTZ    HLD -Continue rosuvastatin   CAD -Continue ASA   Obesity - Body mass index is 31.74 kg/m, follow with PCP.   DM 2 - -Prior A1c was 5.9, indicating good control, hold Glucophage, cover with moderate-scale SSI   Lab Results  Component Value Date   HGBA1C 5.9 (H) 08/09/2021   CBG (last 3)  Recent Labs    04/16/22 0822  GLUCAP 163*         Condition - Fair  Family Communication  :  None  Code Status :  Full  Consults  :  None  PUD Prophylaxis : PPI   Procedures  :            Disposition Plan  :    Status is: Inpatient  DVT Prophylaxis  :    enoxaparin (LOVENOX) injection 40 mg Start: 04/15/22 2200    Lab Results  Component Value Date   PLT 276 04/16/2022    Diet :  Diet Order             Diet clear liquid Room service appropriate? Yes; Fluid consistency: Thin  Diet effective now                    Inpatient Medications  Scheduled Meds:  vitamin C  500 mg Oral Daily   aspirin EC  325 mg Oral Daily   enoxaparin (LOVENOX) injection  40 mg Subcutaneous Q24H   insulin aspart  0-15 Units Subcutaneous TID WC   methylPREDNISolone (SOLU-MEDROL) injection  40 mg Intravenous Daily   metoprolol tartrate  25 mg Oral Daily   And   metoprolol tartrate  12.5 mg Oral QHS   pantoprazole  40 mg Oral Daily   rosuvastatin  20 mg Oral QPM   sodium chloride flush  3 mL Intravenous Q12H   zinc sulfate  220 mg Oral Daily   Continuous Infusions: PRN Meds:.acetaminophen, albuterol, chlorpheniramine-HYDROcodone, fluticasone, guaiFENesin-dextromethorphan, melatonin, [DISCONTINUED] ondansetron **OR** ondansetron (ZOFRAN) IV  Antibiotics  :    Anti-infectives (From admission, onward)    None         Objective:   Vitals:   04/16/22 0800 04/16/22 0825 04/16/22 0900 04/16/22 1000  BP:  (!) 156/79    Pulse: (!) 59 78 81 75  Resp: '12 20 10 18  '$ Temp:  97.8 F (36.6 C)    TempSrc:  Oral    SpO2: 93% 93% 96% 98%  Weight:      Height:        Wt  Readings from Last 3 Encounters:  04/16/22 73.7 kg  08/08/21 72.3 kg  09/06/17 89.4 kg    No intake or output data in the 24 hours ending 04/16/22 1032   Physical Exam  Awake Alert, No new F.N deficits, Normal affect [place].AT,PERRAL Supple Neck, No JVD,   Symmetrical Chest wall movement, Good air movement bilaterally, CTAB RRR,No Gallops,Rubs or new Murmurs,  +ve B.Sounds, Abd Soft, No tenderness,   No Cyanosis, Clubbing or edema      Data Review:    Recent Labs  Lab 04/14/22 2109 04/16/22 0227  WBC 6.7 10.6*  HGB 14.2 13.3  HCT 39.8 38.5  PLT 310 276  MCV 76.8* 79.1*  MCH 27.4 27.3  MCHC 35.7 34.5  RDW 12.5 13.0  LYMPHSABS  --  0.5*  MONOABS  --  0.3  EOSABS  --  0.0  BASOSABS  --  0.0    Recent Labs  Lab 04/14/22 2109 04/15/22 0318 04/15/22 1130 04/16/22 0227 04/16/22 0233  NA 124* 123* 130* 125*  --   K 3.0* 3.1* 3.9 3.8  --   CL 85* 88* 96* 92*  --   CO2 '25 24 23 22  '$ --   GLUCOSE 115* 143* 133* 164*  --   BUN 9 8 5* 5*  --  CREATININE 0.82 0.71 0.80 0.76  --   AST 23  --   --   --   --   ALT 28  --   --   --   --   ALKPHOS 105  --   --   --   --   BILITOT 0.7  --   --   --   --   ALBUMIN 4.9  --   --   --   --   BNP  --   --   --   --  39.2  MG  --   --   --   --  1.7  CALCIUM 9.4 8.9 9.1 9.4  --       Recent Labs  Lab 04/14/22 2109 04/15/22 0318 04/15/22 1130 04/16/22 0227 04/16/22 0233  BNP  --   --   --   --  39.2  MG  --   --   --   --  1.7  CALCIUM 9.4 8.9 9.1 9.4  --      Radiology Reports DG Chest Port 1 View  Result Date: 04/16/2022 CLINICAL DATA:  Shortness of breath. EXAM: PORTABLE CHEST 1 VIEW COMPARISON:  April 15, 2022 FINDINGS: The heart size and mediastinal contours are within normal limits. Both lungs are clear. The visualized skeletal structures are unremarkable. IMPRESSION: No active disease. Electronically Signed   By: Abelardo Diesel M.D.   On: 04/16/2022 08:48   DG Chest Portable 1 View  Result Date:  04/15/2022 CLINICAL DATA:  COVID EXAM: PORTABLE CHEST 1 VIEW COMPARISON:  01/09/2016 FINDINGS: Heart and mediastinal contours are within normal limits. No focal opacities or effusions. No acute bony abnormality. IMPRESSION: No active disease. Electronically Signed   By: Rolm Baptise M.D.   On: 04/15/2022 02:10      Signature  -   Lala Lund M.D on 04/16/2022 at 10:32 AM   -  To page go to [email or web address]

## 2022-04-16 NOTE — Evaluation (Signed)
Occupational Therapy Evaluation Patient Details Name: [name] MRN: [id number] DOB: [birth year] Today's Date: 04/16/2022   History of Present Illness [name] is a 65 y.o. female presenting with n/v/d.   She reports that she had sore throat, headache, fatigue the Monday before admission and went to have a COVID test that returned positive.  On the Wednesday before admission, she developed n/v/d that has persisted. with medical history significant of HTN and HLD   Clinical Impression   Pt reports independence at baseline with ADLs and functional mobility, lives with family who can assist at d/c. 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 Pt currently mod I for ADLs, bed mobility, and transfers. Began education on strategies for energy conservation at home. Pt presenting with impairments listed below, will follow acutely. Recommend d/c home with family assistance.      Recommendations for follow up therapy are one component of a multi-disciplinary discharge planning process, led by the attending physician.  Recommendations may be updated based on patient status, additional functional criteria and insurance authorization.   Follow Up Recommendations  No OT follow up     Assistance Recommended at Discharge PRN  Patient can return home with the following Assistance with cooking/housework;Assist for transportation;Help with stairs or ramp for entrance    Functional Status Assessment  Patient has had a recent decline in their functional status and demonstrates the ability to make significant improvements in function in a reasonable and predictable amount of time.  Equipment Recommendations  None recommended by OT    Recommendations for Other Services       Precautions / Restrictions Precautions Precautions: Fall Restrictions Weight Bearing Restrictions: No      Mobility Bed Mobility Overal bed mobility: Modified Independent                   Transfers Overall transfer level: Modified independent                        Balance Overall balance assessment: No apparent balance deficits (not formally assessed)                                         ADL either performed or assessed with clinical judgement   ADL Overall ADL's : Modified independent                                       General ADL Comments: completing LB dressing, toilet transfer, and tub transfer with mod I, no LOB noted     Vision   Vision Assessment?: No apparent visual deficits     Perception Perception Perception Tested?: No   Praxis Praxis Praxis tested?: Not tested    Pertinent Vitals/Pain Pain Assessment Pain Assessment: No/denies pain     Hand Dominance     Extremity/Trunk Assessment Upper Extremity Assessment Upper Extremity Assessment: Generalized weakness   Lower Extremity Assessment Lower Extremity Assessment: Defer to PT evaluation   Cervical / Trunk Assessment Cervical / Trunk Assessment: Normal   Communication Communication Communication: No difficulties   Cognition Arousal/Alertness: Awake/alert Behavior During Therapy: WFL for tasks assessed/performed, Flat affect Overall Cognitive Status: Within Functional Limits for tasks assessed  General Comments: flat affect, appears baseline cognition, follows commands appropriately and provides PLOF accurate to previous admission     General Comments  VSS on RA    Exercises     Shoulder Instructions      Home Living Family/patient expects to be discharged to:: Private residence Living Arrangements: Spouse/significant other;Other (Comment) (and daughter and grandson) Available Help at Discharge: Family;Available 24 hours/day Type of Home: House Home Access: Stairs to enter [employer] of Steps: 4 Entrance Stairs-Rails: Right;Left Home Layout: One level      Bathroom Shower/Tub: Teacher, early years/pre: Standard     Home Equipment: None          Prior Functioning/Environment Prior Level of Function : Independent/Modified Independent;Driving;Working/employed             Mobility Comments: no AD use ADLs Comments: ind        OT Problem List: Decreased activity tolerance      OT Treatment/Interventions: Self-care/ADL training;Therapeutic exercise;DME and/or AE instruction;Therapeutic activities;Patient/family education;Balance training    OT Goals(Current goals can be found in the care plan section) Acute Rehab OT Goals Patient Stated Goal: none stated OT Goal Formulation: With patient Time For Goal Achievement: 04/30/22 Potential to Achieve Goals: Good ADL Goals Pt Will Perform Upper Body Dressing: Independently;standing;sitting Pt Will Perform Lower Body Dressing: Independently;sit to/from stand Pt Will Transfer to Toilet: Independently;ambulating;regular height toilet Additional ADL Goal #1: pt will verbalize 3 energy conservation strategies in prep for ADLs  OT Frequency: Min 2X/week    Co-evaluation              AM-PAC OT "6 Clicks" Daily Activity     Outcome Measure Help from another person eating meals?: None Help from another person taking care of personal grooming?: None Help from another person toileting, which includes using toliet, bedpan, or urinal?: None Help from another person bathing (including washing, rinsing, drying)?: None Help from another person to put on and taking off regular upper body clothing?: None Help from another person to put on and taking off regular lower body clothing?: None 6 Click Score: 24   End of Session Nurse Communication: Mobility status  Activity Tolerance: Patient tolerated treatment well Patient left: in chair;with call bell/phone within reach;with chair alarm set  OT Visit Diagnosis: Muscle weakness (generalized) (M62.81)                Time:  9470-9628 OT Time Calculation (min): 19 min Charges:  OT General Charges $OT Visit: 1 Visit OT Evaluation $OT Eval Low Complexity: 1 Low  [name], OTD, OTR/L SecureChat Preferred Acute Rehab [phone]   [name] 04/16/2022, 9:13 AM

## 2022-04-16 NOTE — Care Management (Signed)
  Transition of Care [facility]) Screening Note   Patient Details  Name: [name] Date of Birth: 10/04/1955   Transition [facility] Care [facility]) CM/SW Contact:    Carles Collet, RN Phone Number: 04/16/2022, 2:10 PM    Transition of Care Department [facility]) has reviewed patient is following patient advancement through interdisciplinary progression rounds. If new patient transition needs arise, please place a TOC consult.

## 2022-04-16 NOTE — Progress Notes (Signed)
TRH night cross cover note:   I was contacted by patient's RN who conveyed patient's request for resumption of home Triazolam, as she had difficultly sleeping last evening following dose of existing prn melatonin.  I subsequently resumed home triazolam, but changed to prn only x1 dose.     Babs Bertin, DO Hospitalist

## 2022-04-16 NOTE — Plan of Care (Signed)
  Problem: Education: Goal: Knowledge of General Education information will improve Description: Including pain rating scale, medication(s)/side effects and non-pharmacologic comfort measures Outcome: Progressing   Problem: Health Behavior/Discharge Planning: Goal: Ability to manage health-related needs will improve Outcome: Progressing   Problem: Clinical Measurements: Goal: Ability to maintain clinical measurements within normal limits will improve Outcome: Progressing Goal: Will remain free from infection Outcome: Progressing Goal: Diagnostic test results will improve Outcome: Progressing Goal: Respiratory complications will improve Outcome: Progressing Goal: Cardiovascular complication will be avoided Outcome: Progressing   Problem: Activity: Goal: Risk for activity intolerance will decrease Outcome: Progressing   Problem: Coping: Goal: Level of anxiety will decrease Outcome: Progressing   Problem: Elimination: Goal: Will not experience complications related to bowel motility Outcome: Progressing Goal: Will not experience complications related to urinary retention Outcome: Progressing   Problem: Pain Managment: Goal: General experience of comfort will improve Outcome: Progressing   Problem: Safety: Goal: Ability to remain free from injury will improve Outcome: Progressing   Problem: Skin Integrity: Goal: Risk for impaired skin integrity will decrease Outcome: Progressing   Problem: Education: Goal: Knowledge of risk factors and measures for prevention of condition will improve Outcome: Progressing   Problem: Respiratory: Goal: Will maintain a patent airway Outcome: Progressing Goal: Complications related to the disease process, condition or treatment will be avoided or minimized Outcome: Progressing   Problem: Nutrition: Goal: Adequate nutrition will be maintained Outcome: Not Progressing

## 2022-04-16 NOTE — Evaluation (Signed)
Physical Therapy Evaluation and Discharge Patient Details Name: [name] MRN: [id number] DOB: [birth year] Today's Date: 04/16/2022  History of Present Illness  [name] is a 66 y.o. female presenting with n/v/d.   She reports that she had sore throat, headache, fatigue the Monday before admission and went to have a COVID test that returned positive.  On the Wednesday before admission, she developed n/v/d that has persisted. with medical history significant of HTN and HLD  Clinical Impression   Patient evaluated by Physical Therapy with no further acute PT needs identified. All education has been completed and the patient has no further questions. Managing independently. See below for any follow-up Physical Therapy or equipment needs. PT is signing off. Thank you for this referral.        Recommendations for follow up therapy are one component of a multi-disciplinary discharge planning process, led by the attending physician.  Recommendations may be updated based on patient status, additional functional criteria and insurance authorization.  Follow Up Recommendations No PT follow up      Assistance Recommended at Discharge PRN  Patient can return home with the following       Equipment Recommendations None recommended by PT  Recommendations for Other Services       Functional Status Assessment       Precautions / Restrictions Precautions Precautions: Other (comment) Precaution Comments: Covid Prec      Mobility  Bed Mobility Overal bed mobility: Modified Independent                  Transfers Overall transfer level: Modified independent Equipment used: None                    Ambulation/Gait Ambulation/Gait assistance: Independent Gait Distance (Feet): 60 Feet Assistive device: None Gait Pattern/deviations: WFL(Within Functional Limits)       General Gait Details: No difficulty  Stairs            Wheelchair  Mobility    Modified Rankin (Stroke Patients Only)       Balance Overall balance assessment: No apparent balance deficits (not formally assessed)                                           Pertinent Vitals/Pain Pain Assessment Pain Assessment: No/denies pain    Home Living Family/patient expects to be discharged to:: Private residence Living Arrangements: Spouse/significant other;Other (Comment) (and daughter and grandson) Available Help at Discharge: Family;Available 24 hours/day Type of Home: House Home Access: Stairs to enter Entrance Stairs-Rails: Psychiatric nurse of Steps: 4   Home Layout: One level Home Equipment: None Additional Comments: Works in [employer] at [employer]    Prior Function Prior Level of Function : Independent/Modified Independent;Driving;Working/employed             Mobility Comments: no AD use ADLs Comments: ind     Hand Dominance   Dominant Hand: Right    Extremity/Trunk Assessment   Upper Extremity Assessment Upper Extremity Assessment: Defer to OT evaluation    Lower Extremity Assessment Lower Extremity Assessment: Overall WFL for tasks assessed    Cervical / Trunk Assessment Cervical / Trunk Assessment: Normal  Communication   Communication: No difficulties  Cognition Arousal/Alertness: Awake/alert Behavior During Therapy: WFL for tasks assessed/performed, Flat affect Overall Cognitive Status: Within Functional Limits for tasks assessed  General Comments General comments (skin integrity, edema, etc.): VSS on RA; O2 sats 96% at end of session    Exercises     Assessment/Plan    PT Assessment Patient does not need any further PT services  PT Problem List         PT Treatment Interventions      PT Goals (Current goals can be found in the Care Plan section)  Acute Rehab PT Goals Patient Stated Goal: Hopes to  be home tomorrow PT Goal Formulation: All assessment and education complete, DC therapy    Frequency       Co-evaluation               AM-PAC PT "6 Clicks" Mobility  Outcome Measure Help needed turning from your back to your side while in a flat bed without using bedrails?: None Help needed moving from lying on your back to sitting on the side of a flat bed without using bedrails?: None Help needed moving to and from a bed to a chair (including a wheelchair)?: None Help needed standing up from a chair using your arms (e.g., wheelchair or bedside chair)?: None Help needed to walk in hospital room?: None Help needed climbing 3-5 steps with a railing? : None 6 Click Score: 24    End of Session   Activity Tolerance: Patient tolerated treatment well Patient left: in bed;with call bell/phone within reach Nurse Communication: Mobility status PT Visit Diagnosis: Other abnormalities of gait and mobility (R26.89)    Time: 3354-5625 PT Time Calculation (min) (ACUTE ONLY): 16 min   Charges:   PT Evaluation $PT Eval Low Complexity: [place] [facility] [place] 04/16/2022, 4:18 PM

## 2022-04-17 ENCOUNTER — Other Ambulatory Visit (HOSPITAL_COMMUNITY): Payer: Self-pay

## 2022-04-17 DIAGNOSIS — U071 COVID-19: Secondary | ICD-10-CM | POA: Diagnosis not present

## 2022-04-17 LAB — BASIC METABOLIC PANEL
Anion gap: 11 (ref 5–15)
BUN: 9 mg/dL (ref 8–23)
CO2: 24 mmol/L (ref 22–32)
Calcium: 9.2 mg/dL (ref 8.9–10.3)
Chloride: 93 mmol/L — ABNORMAL LOW (ref 98–111)
Creatinine, Ser: 0.8 mg/dL (ref 0.44–1.00)
GFR, Estimated: >60 mL/min (ref >60)
Glucose, Bld: 109 mg/dL — ABNORMAL HIGH (ref 70–99)
Potassium: 3.7 mmol/L (ref 3.5–5.1)
Sodium: 128 mmol/L — ABNORMAL LOW (ref 135–145)

## 2022-04-17 LAB — CBC WITH DIFFERENTIAL/PLATELET
Abs Immature Granulocytes: 0.07 10*3/uL (ref 0.00–0.07)
Basophils Absolute: 0 10*3/uL (ref 0.0–0.1)
Basophils Relative: 0 %
Eosinophils Absolute: 0 10*3/uL (ref 0.0–0.5)
Eosinophils Relative: 0 %
HCT: 33.9 % — ABNORMAL LOW (ref 36.0–46.0)
Hemoglobin: 12.3 g/dL (ref 12.0–15.0)
Immature Granulocytes: 1 %
Lymphocytes Relative: 14 %
Lymphs Abs: 2 10*3/uL (ref 0.7–4.0)
MCH: 28 pg (ref 26.0–34.0)
MCHC: 36.3 g/dL — ABNORMAL HIGH (ref 30.0–36.0)
MCV: 77 fL — ABNORMAL LOW (ref 80.0–100.0)
Monocytes Absolute: 1.4 10*3/uL — ABNORMAL HIGH (ref 0.1–1.0)
Monocytes Relative: 10 %
Neutro Abs: 10.6 10*3/uL — ABNORMAL HIGH (ref 1.7–7.7)
Neutrophils Relative %: 75 %
Platelets: 298 10*3/uL (ref 150–400)
RBC: 4.4 MIL/uL (ref 3.87–5.11)
RDW: 12.9 % (ref 11.5–15.5)
WBC: 14.2 10*3/uL — ABNORMAL HIGH (ref 4.0–10.5)
nRBC: 0 % (ref 0.0–0.2)

## 2022-04-17 LAB — GLUCOSE, CAPILLARY
Glucose-Capillary: 114 mg/dL — ABNORMAL HIGH (ref 70–99)
Glucose-Capillary: 130 mg/dL — ABNORMAL HIGH (ref 70–99)
Glucose-Capillary: 138 mg/dL — ABNORMAL HIGH (ref 70–99)
Glucose-Capillary: 139 mg/dL — ABNORMAL HIGH (ref 70–99)

## 2022-04-17 LAB — BRAIN NATRIURETIC PEPTIDE: B Natriuretic Peptide: 73 pg/mL (ref 0.0–100.0)

## 2022-04-17 LAB — C-REACTIVE PROTEIN: CRP: <0.5 mg/dL (ref <1.0)

## 2022-04-17 LAB — MAGNESIUM: Magnesium: 1.7 mg/dL (ref 1.7–2.4)

## 2022-04-17 MED ORDER — LACTATED RINGERS IV SOLN: INTRAVENOUS | Status: AC

## 2022-04-17 MED ORDER — CEPHALEXIN 500 MG PO CAPS
500.0000 mg | ORAL_CAPSULE | Freq: Three times a day (TID) | ORAL | 0 refills | Status: DC
  Filled 2022-04-17: qty 9, 3d supply, fill #0

## 2022-04-17 NOTE — Progress Notes (Signed)
Patient discharging home. LR infusion complete and IV removed. RN reviewed discharge instructions with patient including medication changes, follow up appointments, diagnosis education and instructions for home care. Work excuse note provided for patient. No further questions at this time. Pharmacy to deliver new medicine. Family called and will transport patient home once she receives her medicine.

## 2022-04-17 NOTE — Discharge Instructions (Signed)
Follow with Primary MD Marda Stalker, PA-C in 7 days   Get CBC, CMP, Magnesium -  checked next visit with your primary MD   Activity: As tolerated with Full fall precautions use walker/cane & assistance as needed  Disposition Home   Diet: Heart Healthy Low Carb.  Special Instructions: If you have smoked or chewed Tobacco  in the last 2 yrs please stop smoking, stop any regular Alcohol  and or any Recreational drug use.  On your next visit with your primary care physician please Get Medicines reviewed and adjusted.  Please request your Prim.MD to go over all Hospital Tests and Procedure/Radiological results at the follow up, please get all Hospital records sent to your Prim MD by signing hospital release before you go home.  If you experience worsening of your admission symptoms, develop shortness of breath, life threatening emergency, suicidal or homicidal thoughts you must seek medical attention immediately by calling 911 or calling your MD immediately  if symptoms less severe.  You Must read complete instructions/literature along with all the possible adverse reactions/side effects for all the Medicines you take and that have been prescribed to you. Take any new Medicines after you have completely understood and accpet all the possible adverse reactions/side effects.

## 2022-04-17 NOTE — Plan of Care (Signed)
                                      [place]                            [address] [place], [place] [address]      [name] was admitted to the Hospital on 04/15/2022 and Discharged  04/17/2022 and should be excused from work/school   for 7 days starting from date -  04/15/2022 , may return to work/school without any restrictions.  Call Lala Lund MD, [facility]  [phone] with questions.  Lala Lund M.D on 04/17/2022,at 10:46 AM  [facility]   Office  [phone]

## 2022-04-17 NOTE — TOC Transition Note (Signed)
Transition of Care [facility]) - CM/SW Discharge Note   Patient Details  Name: [name] MRN: [id number] Date of Birth: 02/04/1956  Transition of Care [facility]) CM/SW Contact:  Tom-Johnson, Renea Ee, RN Phone Number: 04/17/2022, 10:41 AM   Clinical Narrative:     Patient is scheduled for discharge today. No PT/OT f/u noted. Denies any needs. Family to transport at discharge. No further TOC needs noted.     Final next level of care: Home/Self Care Barriers to Discharge: Barriers Resolved   Patient Goals and CMS Choice Patient states their goals for this hospitalization and ongoing recovery are:: To return home CMS Medicare.gov Compare Post Acute Care list provided to:: Patient Choice offered to / list presented to : NA  Discharge Placement                Patient to be transferred to facility by: Family      Discharge Plan and Services                DME Arranged: N/A DME Agency: NA       HH Arranged: NA HH Agency: NA        Social Determinants of Health (SDOH) Interventions Transportation Interventions: Intervention Not Indicated, Inpatient TOC, Patient Resources (Friends/Family)   Readmission Risk Interventions     No data to display

## 2022-04-17 NOTE — Discharge Summary (Signed)
[name] [facility] KZL:[id number] DOB: 1956-05-10 DOA: 04/15/2022  PCP: Marda Stalker, PA-C  Admit date: 04/15/2022  Discharge date: 04/17/2022  Admitted From: Home   Disposition:  Home   Recommendations for Outpatient Follow-up:   Follow up with PCP in 1-2 weeks  PCP Please obtain BMP/CBC, 2 view CXR in 1week,  (see Discharge instructions)   PCP Please follow up on the following pending results: Check BMP and magnesium in 5-7 days.   Home Health: None   Equipment/Devices: None  Consultations: None  Discharge Condition: Stable    CODE STATUS: Full    Diet Recommendation: Heart Healthy Low Carb    Chief Complaint  Patient presents with   Emesis   Diarrhea     Brief history of present illness from the day of admission and additional interim summary     66 y.o. female with medical history significant of HTN and HLD presenting with n/v/d.   She reports that she had sore throat, headache, fatigue on Monday and went to have a COVID test that returned positive.  On Wednesday, she developed n/v/d that has persisted.  She was taking molnupiravir and thought it was related to that, took 4 days' worth of therapy and none after. In the ER diagnosed with dehydration and Hyponatremia.                                                                  Hospital Course   COVID Infection -Patient with presenting with primarily GI symptoms in the setting of COVID-19; this may be from the COVID infection, the molnupiravir, or both, she was also on HCTZ and that made her quite dehydrated with hyponatremia, she had no pulmonary symptoms, CRP was stable and she is relatively symptom-free, eager to go home.  She has been hydrated, completely symptom-free now all symptoms resolved will be discharged home.   Hyponatremia -  -Etiology appears to be dehydration in the setting of recent nausea vomiting and diarrhea, along with HCTZ use at home, urine sodium was under 10, urine osmolality was less than serum osmolality, HCTZ discontinued hydrated today again thereafter discharged home, patient does not want to stay on the hospital any longer.  Follow-up with PCP in 5 to 7 days for repeat BMP.   HTN -Continue losartan, metoprolol, Hold HCTZ upon discharge due to hyponatremia.   HLD -Continue rosuvastatin   CAD -Continue ASA   Obesity - Body mass index is 31.74 kg/m, follow with PCP.  DM 2 - -Prior A1c was 5.9, indicating good control, home regimen continue.  UTI.  Placed on Keflex.  Discharge diagnosis     Principal Problem:   COVID-19 virus infection Active Problems:   HLD (hyperlipidemia)   Essential hypertension   Acute hyponatremia   Diabetes mellitus type 2, controlled, without  complications [facility])    Discharge instructions    Discharge Instructions     Discharge instructions   Complete by: As directed    Follow with Primary MD Marda Stalker, PA-C in 7 days   Get CBC, CMP, Magnesium -  checked next visit with your primary MD   Activity: As tolerated with Full fall precautions use walker/cane & assistance as needed  Disposition Home   Diet: Heart Healthy Low Carb.  Special Instructions: If you have smoked or chewed Tobacco  in the last 2 yrs please stop smoking, stop any regular Alcohol  and or any Recreational drug use.  On your next visit with your primary care physician please Get Medicines reviewed and adjusted.  Please request your Prim.MD to go over all Hospital Tests and Procedure/Radiological results at the follow up, please get all Hospital records sent to your Prim MD by signing hospital release before you go home.  If you experience worsening of your admission symptoms, develop shortness of breath, life threatening emergency, suicidal or homicidal thoughts you must seek  medical attention immediately by calling 911 or calling your MD immediately  if symptoms less severe.  You Must read complete instructions/literature along with all the possible adverse reactions/side effects for all the Medicines you take and that have been prescribed to you. Take any new Medicines after you have completely understood and accpet all the possible adverse reactions/side effects.   Increase activity slowly   Complete by: As directed    MyChart COVID-19 home monitoring program   Complete by: Apr 17, 2022    Is the patient willing to use the [facility] for home monitoring?: No       Discharge Medications   Allergies as of 04/17/2022       Reactions   Codeine Nausea And Vomiting        Medication List     STOP taking these medications    aspirin EC 325 MG tablet   clobetasol cream 0.05 % Commonly known as: TEMOVATE   clobetasol ointment 0.05 % Commonly known as: TEMOVATE   doxepin 10 MG capsule Commonly known as: SINEQUAN   hydrochlorothiazide 25 MG tablet Commonly known as: HYDRODIURIL   Lagevrio 200 MG Caps capsule Generic drug: molnupiravir EUA       TAKE these medications    acetaminophen 500 MG tablet Commonly known as: TYLENOL Take 1,000 mg by mouth 2 (two) times daily as needed for moderate pain.   cephALEXin 500 MG capsule Commonly known as: KEFLEX Take 1 capsule (500 mg total) by mouth every 8 (eight) hours.   dexlansoprazole 60 MG capsule Commonly known as: DEXILANT Take 60 mg by mouth daily.   fluticasone 50 MCG/ACT nasal spray Commonly known as: FLONASE Place 2 sprays into both nostrils daily.   losartan 100 MG tablet Commonly known as: COZAAR Take 100 mg by mouth daily.   metFORMIN 500 MG tablet Commonly known as: GLUCOPHAGE Take 500 mg by mouth daily.   metoprolol tartrate 25 MG tablet Commonly known as: LOPRESSOR Take 0.5 tablets (12.5 mg total) by mouth 2 (two) times daily. What changed:  how much to  take when to take this   Rinvoq 15 MG Tb24 Generic drug: Upadacitinib ER Take 15 mg by mouth at bedtime.   rosuvastatin 10 MG tablet Commonly known as: CRESTOR Take 10 mg by mouth daily. What changed: Another medication with the same name was removed. Continue taking this medication, and follow the directions you see here.  triazolam 0.25 MG tablet Commonly known as: HALCION Take 0.25 mg by mouth at bedtime.         Follow-up Information     Marda Stalker, PA-C. Schedule an appointment as soon as possible for a visit in 1 week(s).   Specialty: Family Medicine Contact information: [place] [address] [phone]                 Major procedures and Radiology Reports - PLEASE review detailed and final reports thoroughly  -      DG Chest Port 1 View  Result Date: 04/16/2022 CLINICAL DATA:  Shortness of breath. EXAM: PORTABLE CHEST 1 VIEW COMPARISON:  April 15, 2022 FINDINGS: The heart size and mediastinal contours are within normal limits. Both lungs are clear. The visualized skeletal structures are unremarkable. IMPRESSION: No active disease. Electronically Signed   By: Abelardo Diesel M.D.   On: 04/16/2022 08:48   DG Chest Portable 1 View  Result Date: 04/15/2022 CLINICAL DATA:  COVID EXAM: PORTABLE CHEST 1 VIEW COMPARISON:  01/09/2016 FINDINGS: Heart and mediastinal contours are within normal limits. No focal opacities or effusions. No acute bony abnormality. IMPRESSION: No active disease. Electronically Signed   By: Rolm Baptise M.D.   On: 04/15/2022 02:10    Micro Results    No results found for this or any previous visit (from the past 240 hour(s)).  Today   Subjective    [name] today has no headache,no chest abdominal pain,no new weakness tingling or numbness, feels much better wants to go home today.    Objective   Blood pressure 138/74, pulse 77, temperature 97.8 F (36.6 C), temperature source Oral, resp. rate  15, height '5\' 1"'$  (1.549 m), weight 73.7 kg, SpO2 96 %.   Intake/Output Summary (Last 24 hours) at 04/17/2022 1044 Last data filed at 04/17/2022 0900 Gross per 24 hour  Intake 1080 ml  Output 1500 ml  Net -420 ml    Exam  Awake Alert, No new F.N deficits,    [place].AT,PERRAL Supple Neck,   Symmetrical Chest wall movement, Good air movement bilaterally, CTAB RRR,No Gallops,   +ve B.Sounds, Abd Soft, Non tender,  No Cyanosis, Clubbing or edema    Data Review   Recent Labs  Lab 04/14/22 2109 04/16/22 0227 04/17/22 0747  WBC 6.7 10.6* 14.2*  HGB 14.2 13.3 12.3  HCT 39.8 38.5 33.9*  PLT 310 276 298  MCV 76.8* 79.1* 77.0*  MCH 27.4 27.3 28.0  MCHC 35.7 34.5 36.3*  RDW 12.5 13.0 12.9  LYMPHSABS  --  0.5* 2.0  MONOABS  --  0.3 1.4*  EOSABS  --  0.0 0.0  BASOSABS  --  0.0 0.0    Recent Labs  Lab 04/14/22 2109 04/15/22 0318 04/15/22 1130 04/16/22 0227 04/16/22 0233 04/16/22 1006 04/17/22 0747  NA 124* 123* 130* 125*  --   --  128*  K 3.0* 3.1* 3.9 3.8  --   --  3.7  CL 85* 88* 96* 92*  --   --  93*  CO2 '25 24 23 22  '$ --   --  24  GLUCOSE 115* 143* 133* 164*  --   --  109*  BUN 9 8 5* 5*  --   --  9  CREATININE 0.82 0.71 0.80 0.76  --   --  0.80  AST 23  --   --   --   --   --   --  ALT 28  --   --   --   --   --   --   ALKPHOS 105  --   --   --   --   --   --   BILITOT 0.7  --   --   --   --   --   --   ALBUMIN 4.9  --   --   --   --   --   --   CRP  --   --   --   --   --  <0.5 <0.5  BNP  --   --   --   --  39.2  --  73.0  MG  --   --   --   --  1.7  --  1.7  CALCIUM 9.4 8.9 9.1 9.4  --   --  9.2     Total Time in preparing paper work, data evaluation and todays exam - 35 minutes  Signature  -    Lala Lund M.D on 04/17/2022 at 10:44 AM   -  To page go to [email or web address]

## 2022-04-19 DIAGNOSIS — E871 Hypo-osmolality and hyponatremia: Secondary | ICD-10-CM | POA: Diagnosis not present

## 2022-04-19 DIAGNOSIS — R112 Nausea with vomiting, unspecified: Secondary | ICD-10-CM | POA: Diagnosis not present

## 2022-04-19 DIAGNOSIS — Z6830 Body mass index (BMI) 30.0-30.9, adult: Secondary | ICD-10-CM | POA: Diagnosis not present

## 2022-04-19 DIAGNOSIS — N390 Urinary tract infection, site not specified: Secondary | ICD-10-CM | POA: Diagnosis not present

## 2022-04-20 LAB — URINE CULTURE: Culture: 70000 — AB

## 2022-04-25 DIAGNOSIS — K2970 Gastritis, unspecified, without bleeding: Secondary | ICD-10-CM | POA: Diagnosis not present

## 2022-04-25 DIAGNOSIS — K5909 Other constipation: Secondary | ICD-10-CM | POA: Diagnosis not present

## 2022-04-25 DIAGNOSIS — D509 Iron deficiency anemia, unspecified: Secondary | ICD-10-CM | POA: Diagnosis not present

## 2022-04-25 DIAGNOSIS — E559 Vitamin D deficiency, unspecified: Secondary | ICD-10-CM | POA: Diagnosis not present

## 2022-04-25 DIAGNOSIS — K219 Gastro-esophageal reflux disease without esophagitis: Secondary | ICD-10-CM | POA: Diagnosis not present

## 2022-04-25 DIAGNOSIS — R112 Nausea with vomiting, unspecified: Secondary | ICD-10-CM | POA: Diagnosis not present

## 2022-05-12 DIAGNOSIS — Z20822 Contact with and (suspected) exposure to covid-19: Secondary | ICD-10-CM | POA: Diagnosis not present

## 2022-05-12 DIAGNOSIS — R059 Cough, unspecified: Secondary | ICD-10-CM | POA: Diagnosis not present

## 2022-05-12 DIAGNOSIS — Z6831 Body mass index (BMI) 31.0-31.9, adult: Secondary | ICD-10-CM | POA: Diagnosis not present

## 2022-06-05 ENCOUNTER — Encounter (INDEPENDENT_AMBULATORY_CARE_PROVIDER_SITE_OTHER): Payer: Medicare HMO | Admitting: Ophthalmology

## 2022-06-23 DIAGNOSIS — Z6832 Body mass index (BMI) 32.0-32.9, adult: Secondary | ICD-10-CM | POA: Diagnosis not present

## 2022-06-23 DIAGNOSIS — R350 Frequency of micturition: Secondary | ICD-10-CM | POA: Diagnosis not present

## 2022-06-23 DIAGNOSIS — H6091 Unspecified otitis externa, right ear: Secondary | ICD-10-CM | POA: Diagnosis not present

## 2022-08-07 DIAGNOSIS — Z6832 Body mass index (BMI) 32.0-32.9, adult: Secondary | ICD-10-CM | POA: Diagnosis not present

## 2022-08-07 DIAGNOSIS — Z1389 Encounter for screening for other disorder: Secondary | ICD-10-CM | POA: Diagnosis not present

## 2022-08-07 DIAGNOSIS — E0822 Diabetes mellitus due to underlying condition with diabetic chronic kidney disease: Secondary | ICD-10-CM | POA: Diagnosis not present

## 2022-08-07 DIAGNOSIS — M810 Age-related osteoporosis without current pathological fracture: Secondary | ICD-10-CM | POA: Diagnosis not present

## 2022-08-07 DIAGNOSIS — K219 Gastro-esophageal reflux disease without esophagitis: Secondary | ICD-10-CM | POA: Diagnosis not present

## 2022-08-07 DIAGNOSIS — E1159 Type 2 diabetes mellitus with other circulatory complications: Secondary | ICD-10-CM | POA: Diagnosis not present

## 2022-08-07 DIAGNOSIS — G4700 Insomnia, unspecified: Secondary | ICD-10-CM | POA: Diagnosis not present

## 2022-08-07 DIAGNOSIS — R69 Illness, unspecified: Secondary | ICD-10-CM | POA: Diagnosis not present

## 2022-08-07 DIAGNOSIS — E7800 Pure hypercholesterolemia, unspecified: Secondary | ICD-10-CM | POA: Diagnosis not present

## 2022-08-07 DIAGNOSIS — I10 Essential (primary) hypertension: Secondary | ICD-10-CM | POA: Diagnosis not present

## 2022-08-07 DIAGNOSIS — Z9181 History of falling: Secondary | ICD-10-CM | POA: Diagnosis not present

## 2022-08-07 DIAGNOSIS — Z0000 Encounter for general adult medical examination without abnormal findings: Secondary | ICD-10-CM | POA: Diagnosis not present

## 2022-08-07 DIAGNOSIS — I700 Atherosclerosis of aorta: Secondary | ICD-10-CM | POA: Diagnosis not present

## 2022-08-08 ENCOUNTER — Other Ambulatory Visit: Payer: Self-pay | Admitting: Family Medicine

## 2022-08-08 DIAGNOSIS — Z1231 Encounter for screening mammogram for malignant neoplasm of breast: Secondary | ICD-10-CM

## 2022-08-16 ENCOUNTER — Other Ambulatory Visit (HOSPITAL_COMMUNITY): Payer: Self-pay | Admitting: *Deleted

## 2022-08-16 DIAGNOSIS — E871 Hypo-osmolality and hyponatremia: Secondary | ICD-10-CM | POA: Diagnosis not present

## 2022-08-16 DIAGNOSIS — N182 Chronic kidney disease, stage 2 (mild): Secondary | ICD-10-CM | POA: Diagnosis not present

## 2022-08-16 DIAGNOSIS — I129 Hypertensive chronic kidney disease with stage 1 through stage 4 chronic kidney disease, or unspecified chronic kidney disease: Secondary | ICD-10-CM | POA: Diagnosis not present

## 2022-08-16 DIAGNOSIS — I10 Essential (primary) hypertension: Secondary | ICD-10-CM | POA: Diagnosis not present

## 2022-08-17 ENCOUNTER — Ambulatory Visit (HOSPITAL_COMMUNITY)
Admission: RE | Admit: 2022-08-17 | Discharge: 2022-08-17 | Disposition: A | Discharge status: Home / Self Care | Payer: Medicare HMO | Source: Ambulatory Visit | Attending: Family Medicine | Admitting: Family Medicine

## 2022-08-17 DIAGNOSIS — M810 Age-related osteoporosis without current pathological fracture: Secondary | ICD-10-CM | POA: Diagnosis not present

## 2022-08-17 MED ORDER — ZOLEDRONIC ACID 5 MG/100ML IV SOLN: 5.0000 mg | Freq: Once | INTRAVENOUS | Status: AC

## 2022-08-17 MED ORDER — ZOLEDRONIC ACID 5 MG/100ML IV SOLN
INTRAVENOUS | Status: AC
  Administered 2022-08-17: 5 mg via INTRAVENOUS
  Filled 2022-08-17: qty 100

## 2022-08-21 ENCOUNTER — Encounter (HOSPITAL_COMMUNITY): Payer: Medicare HMO

## 2022-08-29 DIAGNOSIS — D72829 Elevated white blood cell count, unspecified: Secondary | ICD-10-CM | POA: Diagnosis not present

## 2022-10-24 DIAGNOSIS — L570 Actinic keratosis: Secondary | ICD-10-CM | POA: Diagnosis not present

## 2022-10-30 DIAGNOSIS — R0981 Nasal congestion: Secondary | ICD-10-CM | POA: Diagnosis not present

## 2022-10-30 DIAGNOSIS — R0989 Other specified symptoms and signs involving the circulatory and respiratory systems: Secondary | ICD-10-CM | POA: Diagnosis not present

## 2022-10-30 DIAGNOSIS — Z03818 Encounter for observation for suspected exposure to other biological agents ruled out: Secondary | ICD-10-CM | POA: Diagnosis not present

## 2022-10-30 DIAGNOSIS — J029 Acute pharyngitis, unspecified: Secondary | ICD-10-CM | POA: Diagnosis not present

## 2022-10-30 DIAGNOSIS — R051 Acute cough: Secondary | ICD-10-CM | POA: Diagnosis not present

## 2022-11-02 DIAGNOSIS — R399 Unspecified symptoms and signs involving the genitourinary system: Secondary | ICD-10-CM | POA: Diagnosis not present

## 2022-11-02 DIAGNOSIS — Z6833 Body mass index (BMI) 33.0-33.9, adult: Secondary | ICD-10-CM | POA: Diagnosis not present

## 2022-11-02 DIAGNOSIS — J0100 Acute maxillary sinusitis, unspecified: Secondary | ICD-10-CM | POA: Diagnosis not present

## 2022-11-20 DIAGNOSIS — R053 Chronic cough: Secondary | ICD-10-CM | POA: Diagnosis not present

## 2022-11-20 DIAGNOSIS — J0100 Acute maxillary sinusitis, unspecified: Secondary | ICD-10-CM | POA: Diagnosis not present

## 2022-11-20 DIAGNOSIS — Z0000 Encounter for general adult medical examination without abnormal findings: Secondary | ICD-10-CM | POA: Diagnosis not present

## 2022-12-05 DIAGNOSIS — L570 Actinic keratosis: Secondary | ICD-10-CM | POA: Diagnosis not present

## 2022-12-18 DIAGNOSIS — N182 Chronic kidney disease, stage 2 (mild): Secondary | ICD-10-CM | POA: Diagnosis not present

## 2022-12-18 DIAGNOSIS — R600 Localized edema: Secondary | ICD-10-CM | POA: Diagnosis not present

## 2022-12-18 DIAGNOSIS — E871 Hypo-osmolality and hyponatremia: Secondary | ICD-10-CM | POA: Diagnosis not present

## 2022-12-18 DIAGNOSIS — E1122 Type 2 diabetes mellitus with diabetic chronic kidney disease: Secondary | ICD-10-CM | POA: Diagnosis not present

## 2022-12-18 DIAGNOSIS — I129 Hypertensive chronic kidney disease with stage 1 through stage 4 chronic kidney disease, or unspecified chronic kidney disease: Secondary | ICD-10-CM | POA: Diagnosis not present

## 2022-12-25 DIAGNOSIS — B001 Herpesviral vesicular dermatitis: Secondary | ICD-10-CM | POA: Diagnosis not present

## 2022-12-25 DIAGNOSIS — L570 Actinic keratosis: Secondary | ICD-10-CM | POA: Diagnosis not present

## 2023-01-29 DIAGNOSIS — R1110 Vomiting, unspecified: Secondary | ICD-10-CM | POA: Diagnosis not present

## 2023-01-29 DIAGNOSIS — R509 Fever, unspecified: Secondary | ICD-10-CM | POA: Diagnosis not present

## 2023-01-29 DIAGNOSIS — Z20822 Contact with and (suspected) exposure to covid-19: Secondary | ICD-10-CM | POA: Diagnosis not present

## 2023-02-07 DIAGNOSIS — E1122 Type 2 diabetes mellitus with diabetic chronic kidney disease: Secondary | ICD-10-CM | POA: Diagnosis not present

## 2023-02-07 DIAGNOSIS — E1159 Type 2 diabetes mellitus with other circulatory complications: Secondary | ICD-10-CM | POA: Diagnosis not present

## 2023-02-07 DIAGNOSIS — E559 Vitamin D deficiency, unspecified: Secondary | ICD-10-CM | POA: Diagnosis not present

## 2023-03-08 ENCOUNTER — Other Ambulatory Visit: Payer: Self-pay

## 2023-03-08 ENCOUNTER — Encounter (HOSPITAL_BASED_OUTPATIENT_CLINIC_OR_DEPARTMENT_OTHER): Payer: Self-pay | Admitting: Emergency Medicine

## 2023-03-08 ENCOUNTER — Emergency Department (HOSPITAL_BASED_OUTPATIENT_CLINIC_OR_DEPARTMENT_OTHER): Payer: Medicare HMO

## 2023-03-08 ENCOUNTER — Inpatient Hospital Stay (HOSPITAL_BASED_OUTPATIENT_CLINIC_OR_DEPARTMENT_OTHER)
Admission: EM | Admit: 2023-03-08 | Discharge: 2023-03-12 | DRG: 871 | Disposition: A | Discharge status: Home / Self Care | Payer: Medicare HMO | Attending: Family Medicine | Admitting: Family Medicine

## 2023-03-08 DIAGNOSIS — Z1152 Encounter for screening for COVID-19: Secondary | ICD-10-CM

## 2023-03-08 DIAGNOSIS — Z8673 Personal history of transient ischemic attack (TIA), and cerebral infarction without residual deficits: Secondary | ICD-10-CM

## 2023-03-08 DIAGNOSIS — I4891 Unspecified atrial fibrillation: Secondary | ICD-10-CM | POA: Diagnosis not present

## 2023-03-08 DIAGNOSIS — I2510 Atherosclerotic heart disease of native coronary artery without angina pectoris: Secondary | ICD-10-CM | POA: Diagnosis present

## 2023-03-08 DIAGNOSIS — I471 Supraventricular tachycardia, unspecified: Secondary | ICD-10-CM

## 2023-03-08 DIAGNOSIS — E876 Hypokalemia: Secondary | ICD-10-CM | POA: Diagnosis not present

## 2023-03-08 DIAGNOSIS — J069 Acute upper respiratory infection, unspecified: Principal | ICD-10-CM

## 2023-03-08 DIAGNOSIS — Z85828 Personal history of other malignant neoplasm of skin: Secondary | ICD-10-CM

## 2023-03-08 DIAGNOSIS — I4719 Other supraventricular tachycardia: Secondary | ICD-10-CM | POA: Diagnosis not present

## 2023-03-08 DIAGNOSIS — R918 Other nonspecific abnormal finding of lung field: Secondary | ICD-10-CM | POA: Diagnosis not present

## 2023-03-08 DIAGNOSIS — Z6834 Body mass index (BMI) 34.0-34.9, adult: Secondary | ICD-10-CM | POA: Diagnosis not present

## 2023-03-08 DIAGNOSIS — I472 Ventricular tachycardia, unspecified: Secondary | ICD-10-CM | POA: Diagnosis not present

## 2023-03-08 DIAGNOSIS — K219 Gastro-esophageal reflux disease without esophagitis: Secondary | ICD-10-CM | POA: Diagnosis present

## 2023-03-08 DIAGNOSIS — R059 Cough, unspecified: Secondary | ICD-10-CM | POA: Diagnosis not present

## 2023-03-08 DIAGNOSIS — E669 Obesity, unspecified: Secondary | ICD-10-CM | POA: Diagnosis not present

## 2023-03-08 DIAGNOSIS — R7989 Other specified abnormal findings of blood chemistry: Secondary | ICD-10-CM | POA: Diagnosis not present

## 2023-03-08 DIAGNOSIS — E871 Hypo-osmolality and hyponatremia: Secondary | ICD-10-CM | POA: Diagnosis present

## 2023-03-08 DIAGNOSIS — J9601 Acute respiratory failure with hypoxia: Secondary | ICD-10-CM | POA: Diagnosis present

## 2023-03-08 DIAGNOSIS — E7800 Pure hypercholesterolemia, unspecified: Secondary | ICD-10-CM | POA: Diagnosis not present

## 2023-03-08 DIAGNOSIS — D649 Anemia, unspecified: Secondary | ICD-10-CM | POA: Diagnosis not present

## 2023-03-08 DIAGNOSIS — R1110 Vomiting, unspecified: Secondary | ICD-10-CM | POA: Diagnosis not present

## 2023-03-08 DIAGNOSIS — J189 Pneumonia, unspecified organism: Secondary | ICD-10-CM | POA: Diagnosis not present

## 2023-03-08 DIAGNOSIS — Z9049 Acquired absence of other specified parts of digestive tract: Secondary | ICD-10-CM

## 2023-03-08 DIAGNOSIS — Z841 Family history of disorders of kidney and ureter: Secondary | ICD-10-CM

## 2023-03-08 DIAGNOSIS — Z79899 Other long term (current) drug therapy: Secondary | ICD-10-CM

## 2023-03-08 DIAGNOSIS — Z8249 Family history of ischemic heart disease and other diseases of the circulatory system: Secondary | ICD-10-CM

## 2023-03-08 DIAGNOSIS — Z885 Allergy status to narcotic agent status: Secondary | ICD-10-CM

## 2023-03-08 DIAGNOSIS — I10 Essential (primary) hypertension: Secondary | ICD-10-CM | POA: Diagnosis not present

## 2023-03-08 DIAGNOSIS — M810 Age-related osteoporosis without current pathological fracture: Secondary | ICD-10-CM | POA: Diagnosis present

## 2023-03-08 DIAGNOSIS — I498 Other specified cardiac arrhythmias: Secondary | ICD-10-CM

## 2023-03-08 DIAGNOSIS — Z87891 Personal history of nicotine dependence: Secondary | ICD-10-CM

## 2023-03-08 DIAGNOSIS — A419 Sepsis, unspecified organism: Principal | ICD-10-CM | POA: Diagnosis present

## 2023-03-08 DIAGNOSIS — E119 Type 2 diabetes mellitus without complications: Secondary | ICD-10-CM | POA: Diagnosis not present

## 2023-03-08 DIAGNOSIS — Z8679 Personal history of other diseases of the circulatory system: Secondary | ICD-10-CM | POA: Diagnosis not present

## 2023-03-08 DIAGNOSIS — I4892 Unspecified atrial flutter: Secondary | ICD-10-CM | POA: Diagnosis present

## 2023-03-08 DIAGNOSIS — R112 Nausea with vomiting, unspecified: Secondary | ICD-10-CM | POA: Diagnosis not present

## 2023-03-08 DIAGNOSIS — Z83719 Family history of colon polyps, unspecified: Secondary | ICD-10-CM

## 2023-03-08 DIAGNOSIS — I499 Cardiac arrhythmia, unspecified: Secondary | ICD-10-CM

## 2023-03-08 DIAGNOSIS — Z7984 Long term (current) use of oral hypoglycemic drugs: Secondary | ICD-10-CM

## 2023-03-08 DIAGNOSIS — R000 Tachycardia, unspecified: Secondary | ICD-10-CM

## 2023-03-08 DIAGNOSIS — Z7901 Long term (current) use of anticoagulants: Secondary | ICD-10-CM

## 2023-03-08 DIAGNOSIS — Z888 Allergy status to other drugs, medicaments and biological substances status: Secondary | ICD-10-CM

## 2023-03-08 DIAGNOSIS — R002 Palpitations: Secondary | ICD-10-CM

## 2023-03-08 LAB — COMPREHENSIVE METABOLIC PANEL
ALT: 17 U/L (ref 0–44)
AST: 20 U/L (ref 15–41)
Albumin: 4.6 g/dL (ref 3.5–5.0)
Alkaline Phosphatase: 76 U/L (ref 38–126)
Anion gap: 10 (ref 5–15)
BUN: 6 mg/dL — ABNORMAL LOW (ref 8–23)
CO2: 24 mmol/L (ref 22–32)
Calcium: 9.3 mg/dL (ref 8.9–10.3)
Chloride: 98 mmol/L (ref 98–111)
Creatinine, Ser: 0.96 mg/dL (ref 0.44–1.00)
GFR, Estimated: >60 mL/min (ref >60)
Glucose, Bld: 136 mg/dL — ABNORMAL HIGH (ref 70–99)
Potassium: 3.1 mmol/L — ABNORMAL LOW (ref 3.5–5.1)
Sodium: 132 mmol/L — ABNORMAL LOW (ref 135–145)
Total Bilirubin: 0.8 mg/dL (ref 0.3–1.2)
Total Protein: 7.5 g/dL (ref 6.5–8.1)

## 2023-03-08 LAB — TROPONIN I (HIGH SENSITIVITY)
Troponin I (High Sensitivity): 12 ng/L (ref <18)
Troponin I (High Sensitivity): 13 ng/L (ref <18)

## 2023-03-08 LAB — URINALYSIS, ROUTINE W REFLEX MICROSCOPIC
Bacteria, UA: NONE SEEN
Bilirubin Urine: NEGATIVE
Glucose, UA: NEGATIVE mg/dL
Hgb urine dipstick: NEGATIVE
Ketones, ur: 15 mg/dL — AB
Nitrite: NEGATIVE
Protein, ur: 30 mg/dL — AB
Specific Gravity, Urine: 1.018 (ref 1.005–1.030)
pH: 6.5 (ref 5.0–8.0)

## 2023-03-08 LAB — CBC
HCT: 38.1 % (ref 36.0–46.0)
Hemoglobin: 12.8 g/dL (ref 12.0–15.0)
MCH: 27.8 pg (ref 26.0–34.0)
MCHC: 33.6 g/dL (ref 30.0–36.0)
MCV: 82.6 fL (ref 80.0–100.0)
Platelets: 274 10*3/uL (ref 150–400)
RBC: 4.61 MIL/uL (ref 3.87–5.11)
RDW: 13.7 % (ref 11.5–15.5)
WBC: 12.5 10*3/uL — ABNORMAL HIGH (ref 4.0–10.5)
nRBC: 0 % (ref 0.0–0.2)

## 2023-03-08 LAB — SARS CORONAVIRUS 2 BY RT PCR: SARS Coronavirus 2 by RT PCR: NEGATIVE

## 2023-03-08 LAB — TSH: TSH: 0.458 u[IU]/mL (ref 0.350–4.500)

## 2023-03-08 LAB — LIPASE, BLOOD: Lipase: <10 U/L — ABNORMAL LOW (ref 11–51)

## 2023-03-08 LAB — MAGNESIUM: Magnesium: 1.6 mg/dL — ABNORMAL LOW (ref 1.7–2.4)

## 2023-03-08 LAB — BRAIN NATRIURETIC PEPTIDE: B Natriuretic Peptide: 122.1 pg/mL — ABNORMAL HIGH (ref 0.0–100.0)

## 2023-03-08 LAB — CK: Total CK: 59 U/L (ref 38–234)

## 2023-03-08 MED ORDER — MAGNESIUM SULFATE 2 GM/50ML IV SOLN
2.0000 g | Freq: Once | INTRAVENOUS | Status: AC
  Administered 2023-03-08: 2 g via INTRAVENOUS
  Filled 2023-03-08: qty 50

## 2023-03-08 MED ORDER — SODIUM CHLORIDE 0.9 % IV SOLN
1.0000 g | Freq: Once | INTRAVENOUS | Status: AC
  Administered 2023-03-08: 1 g via INTRAVENOUS
  Filled 2023-03-08: qty 10

## 2023-03-08 MED ORDER — ONDANSETRON HCL 4 MG/2ML IJ SOLN
4.0000 mg | Freq: Once | INTRAMUSCULAR | Status: AC | PRN
  Administered 2023-03-08: 4 mg via INTRAVENOUS
  Filled 2023-03-08: qty 2

## 2023-03-08 MED ORDER — ACETAMINOPHEN 325 MG PO TABS
650.0000 mg | ORAL_TABLET | Freq: Once | ORAL | Status: AC
  Administered 2023-03-08: 650 mg via ORAL
  Filled 2023-03-08: qty 2

## 2023-03-08 MED ORDER — LABETALOL HCL 5 MG/ML IV SOLN
5.0000 mg | Freq: Once | INTRAVENOUS | Status: AC
Start: 2023-03-08 — End: 2023-03-08
  Administered 2023-03-08: 5 mg via INTRAVENOUS
  Filled 2023-03-08: qty 4

## 2023-03-08 MED ORDER — SODIUM CHLORIDE 0.9 % IV BOLUS
1000.0000 mL | Freq: Once | INTRAVENOUS | Status: AC
  Administered 2023-03-08: 1000 mL via INTRAVENOUS

## 2023-03-08 MED ORDER — POTASSIUM CHLORIDE 10 MEQ/100ML IV SOLN
10.0000 meq | INTRAVENOUS | Status: AC
Start: 2023-03-08 — End: 2023-03-08
  Administered 2023-03-08 (×2): 10 meq via INTRAVENOUS
  Filled 2023-03-08 (×2): qty 100

## 2023-03-08 MED ORDER — SODIUM CHLORIDE 0.9 % IV SOLN
500.0000 mg | Freq: Once | INTRAVENOUS | Status: AC
  Administered 2023-03-08: 500 mg via INTRAVENOUS
  Filled 2023-03-08: qty 5

## 2023-03-08 MED ORDER — LABETALOL HCL 5 MG/ML IV SOLN
5.0000 mg | Freq: Once | INTRAVENOUS | Status: DC
  Filled 2023-03-08: qty 4

## 2023-03-08 NOTE — ED Provider Notes (Signed)
[place] EMERGENCY DEPARTMENT AT [facility] Provider Note   CSN: [id number] Arrival date & time: 03/08/23  1528     History  Chief Complaint  Patient presents with   Emesis    [name] is a 67 y.o. female.  The history is provided by the patient and medical records. No language interpreter was used.  Emesis Severity:  Severe Duration:  2 days Timing:  Constant Quality:  Stomach contents Progression:  Unchanged Chronicity:  New Recent urination:  Normal Relieved by:  Nothing Worsened by:  Nothing Ineffective treatments:  None tried Associated symptoms: chills, cough, fever and URI   Associated symptoms: no abdominal pain, no diarrhea, no headaches and no sore throat        Home Medications Prior to Admission medications   Medication Sig Start Date End Date Taking? Authorizing Provider  acetaminophen (TYLENOL) 500 MG tablet Take 1,000 mg by mouth 2 (two) times daily as needed for moderate pain.    [provider]  cephALEXin (KEFLEX) 500 MG capsule Take 1 capsule (500 mg total) by mouth every 8 (eight) hours. 04/17/22   Leroy Sea, MD  dexlansoprazole (DEXILANT) 60 MG capsule Take 60 mg by mouth daily.    [provider]  fluticasone (FLONASE) 50 MCG/ACT nasal spray Place 2 sprays into both nostrils daily.    [provider]  losartan (COZAAR) 100 MG tablet Take 100 mg by mouth daily.    [provider]  metFORMIN (GLUCOPHAGE) 500 MG tablet Take 500 mg by mouth daily.    [provider]  metoprolol tartrate (LOPRESSOR) 25 MG tablet Take 0.5 tablets (12.5 mg total) by mouth 2 (two) times daily. Patient taking differently: Take 25 mg by mouth daily. 04/20/16   End, Cristal Deer, MD  RINVOQ 15 MG TB24 Take 15 mg by mouth at bedtime. 03/22/22   [provider]  rosuvastatin (CRESTOR) 10 MG tablet Take 10 mg by mouth daily.    [provider]  triazolam (HALCION) 0.25 MG tablet Take  0.25 mg by mouth at bedtime.    [provider]      Allergies    Codeine    Review of Systems   Review of Systems  Constitutional:  Positive for chills, fatigue and fever. Negative for diaphoresis.  HENT:  Positive for congestion. Negative for sore throat.   Eyes:  Negative for visual disturbance.  Respiratory:  Positive for cough. Negative for chest tightness and wheezing.   Cardiovascular:  Positive for palpitations. Negative for chest pain and leg swelling.  Gastrointestinal:  Positive for nausea and vomiting. Negative for abdominal pain, constipation and diarrhea.  Genitourinary:  Negative for dysuria and flank pain.  Musculoskeletal:  Negative for back pain and neck pain.  Skin:  Negative for rash.  Neurological:  Negative for weakness, light-headedness, numbness and headaches.  Psychiatric/Behavioral:  Negative for agitation.   All other systems reviewed and are negative.   Physical Exam Updated Vital Signs BP (!) 146/81   Pulse 96   Temp 99.5 F (37.5 C)   Resp (!) 26   SpO2 93%  Physical Exam Vitals and nursing note reviewed.  Constitutional:      General: She is not in acute distress.    Appearance: She is well-developed. She is not ill-appearing, toxic-appearing or diaphoretic.  HENT:     Head: Normocephalic and atraumatic.     Right Ear: External ear normal. There is no impacted cerumen.     Left Ear:  External ear normal. There is no impacted cerumen.     Nose: Nose normal.     Mouth/Throat:     Mouth: Oropharynx is clear and moist. Mucous membranes are dry.     Pharynx: No oropharyngeal exudate or posterior oropharyngeal erythema.  Eyes:     Extraocular Movements: Extraocular movements intact and EOM normal.     Conjunctiva/sclera: Conjunctivae normal.     Pupils: Pupils are equal, round, and reactive to light.  Cardiovascular:     Rate and Rhythm: Tachycardia present. Rhythm irregular.     Heart sounds: No murmur heard. Pulmonary:     Effort:  No respiratory distress.     Breath sounds: No stridor. No wheezing, rhonchi or rales.  Chest:     Chest wall: No tenderness.  Abdominal:     General: Abdomen is flat. There is no distension.     Tenderness: There is no abdominal tenderness. There is no right CVA tenderness, left CVA tenderness, guarding or rebound.  Musculoskeletal:        General: No tenderness.     Cervical back: Normal range of motion and neck supple. No tenderness.  Skin:    General: Skin is warm.     Findings: No erythema or rash.  Neurological:     Mental Status: She is alert and oriented to person, place, and time.     Sensory: No sensory deficit.     Motor: No weakness or abnormal muscle tone.     Deep Tendon Reflexes: Reflexes are normal and symmetric.     ED Results / Procedures / Treatments   Labs (all labs ordered are listed, but only abnormal results are displayed) Labs Reviewed  LIPASE, BLOOD - Abnormal; Notable for the following components:      Result Value   Lipase <10 (*)    All other components within normal limits  COMPREHENSIVE METABOLIC PANEL - Abnormal; Notable for the following components:   Sodium 132 (*)    Potassium 3.1 (*)    Glucose, Bld 136 (*)    BUN 6 (*)    All other components within normal limits  CBC - Abnormal; Notable for the following components:   WBC 12.5 (*)    All other components within normal limits  URINALYSIS, ROUTINE W REFLEX MICROSCOPIC - Abnormal; Notable for the following components:   Ketones, ur 15 (*)    Protein, ur 30 (*)    Leukocytes,Ua SMALL (*)    All other components within normal limits  BRAIN NATRIURETIC PEPTIDE - Abnormal; Notable for the following components:   B Natriuretic Peptide 122.1 (*)    All other components within normal limits  MAGNESIUM - Abnormal; Notable for the following components:   Magnesium 1.6 (*)    All other components within normal limits  SARS CORONAVIRUS 2 BY RT PCR  TSH  CK  TROPONIN I (HIGH SENSITIVITY)   TROPONIN I (HIGH SENSITIVITY)    EKG EKG Interpretation Date/Time:  Thursday March 08 2023 19:11:37 EDT Ventricular Rate:  184 PR Interval:    QRS Duration:  79 QT Interval:  286 QTC Calculation: 438 R Axis:   38  Text Interpretation: tachycardia with intermittnet sinus rhythm. Ventricular tachycardia, unsustained Repolarization abnormality, prob rate related no STEMI Confirmed by Theda Belfast ([id number]) on 03/08/2023 7:48:24 PM  Radiology DG Chest Portable 1 View  Result Date: 03/08/2023 CLINICAL DATA:  Cough EXAM: PORTABLE CHEST 1 VIEW COMPARISON:  04/16/2022 FINDINGS: Patchy pulmonary infiltrate is seen within the right  mid and lower lung zone, possibly infectious in the appropriate clinical setting. No definite pneumothorax or pleural effusion. Cardiac size within normal limits. Pulmonary vascularity is normal. No acute bone abnormality. IMPRESSION: 1. Patchy right mid and lower lung zone pulmonary infiltrate, possibly infectious in the appropriate clinical setting. Electronically Signed   By: Helyn Numbers M.D.   On: 03/08/2023 20:22    Procedures Procedures    CRITICAL CARE Performed by: Canary Brim Treveon Bourcier Total critical care time: 40 minutes Critical care time was exclusive of separately billable procedures and treating other patients. Critical care was necessary to treat or prevent imminent or life-threatening deterioration. Critical care was time spent personally by me on the following activities: development of treatment plan with patient and/or surrogate as well as nursing, discussions with consultants, evaluation of patient's response to treatment, examination of patient, obtaining history from patient or surrogate, ordering and performing treatments and interventions, ordering and review of laboratory studies, ordering and review of radiographic studies, pulse oximetry and re-evaluation of patient's condition.   Medications Ordered in ED Medications  potassium  chloride 10 mEq in 100 mL IVPB (10 mEq Intravenous New Bag/Given 03/08/23 2036)  magnesium sulfate IVPB 2 g 50 mL (2 g Intravenous New Bag/Given 03/08/23 2034)  azithromycin (ZITHROMAX) 500 mg in sodium chloride 0.9 % 250 mL IVPB (500 mg Intravenous New Bag/Given 03/08/23 2116)  labetalol (NORMODYNE) injection 5 mg (has no administration in time range)  ondansetron (ZOFRAN) injection 4 mg (4 mg Intravenous Given 03/08/23 1613)  sodium chloride 0.9 % bolus 1,000 mL (1,000 mLs Intravenous New Bag/Given 03/08/23 1924)  labetalol (NORMODYNE) injection 5 mg (5 mg Intravenous Given 03/08/23 1939)  acetaminophen (TYLENOL) tablet 650 mg (650 mg Oral Given 03/08/23 2020)  cefTRIAXone (ROCEPHIN) 1 g in sodium chloride 0.9 % 100 mL IVPB (0 g Intravenous Stopped 03/08/23 2115)    ED Course/ Medical Decision Making/ A&P                                 Medical Decision Making Amount and/or Complexity of Data Reviewed Labs: ordered. Radiology: ordered.  Risk OTC drugs. Prescription drug management. Decision regarding hospitalization.    Cebrina Herington is a 67 y.o. female with a past medical history significant for hypertension, diabetes, hyperlipidemia, previous appendectomy and cholecystectomy, GERD, osteoporosis, and previous TIA who presents with 2 days of subjective fevers, chills, nausea, vomiting, productive cough, congestion, malaise, myalgias, and palpitations.  According to patient, she is feeling complete normal 3 days ago and then yesterday started feeling ill.  She denies any sick contacts.  She reports she started having some URI symptoms with congestion and cough and has some palpitations.  She denies any history of A-fib or a flutter.  She reports a family history of arrhythmias.  Denies any cardiac history herself.  She reports she has had some nausea and vomiting but denies abdominal pain or chest pain.  She denies any constipation, diarrhea, or urinary changes.  No leg pain or  leg swelling.  She has felt diffuse muscle aches and malaise and fatigue.  On my exam, lungs were clear and chest was nontender.  Abdomen is nontender.  Patient has dry mucous membranes and is just had some dry heaving.  Legs are nontender and not critical edematous.  Good pulses in extremities.  Patient's heart rate is very regular.  She had an EKG that appeared to show a intermittently sinus tachycardia  that had numerous PVCs and occasional V. tach.  I do not think it is A-fib but it is very abnormal.  Patient had several 5 beat runs of V. tach.  She does feel the palpitations.  Otherwise dry mucous membranes.  Will give some fluids and will get screening labs.  With her muscle cramps we will get a CK.  Will check for COVID.  Will get chest x-ray and electrolyte evaluation.  Initial potassium was 3.1, as she is not tolerating p.o. we will give some IV potassium.  Due to her intermittent heart rate going into the 160s, intermittent V. tach, and ill appearance, anticipate admission.  Will call cardiology to have them look at the EKG as well.  7:33 PM Spoke with Dr. Anne Fu and he does not think it is VT. he suspects it is a intermittent SVT with aberrant conduction versus some atypical atrial tachycardia but he does not want [place] to give 5 of labetalol was given slows down and we can repeat it to other times.  If it does slow down he would say start an oral dose of metoprolol tartrate orally but he agrees with replating with IV potassium, checking of the labs and x-ray, and admit to hospitalist service at [facility] for further management.  7:44 PM Patient reports she takes metoprolol normally.  Will give her a dose of labetalol as recommended by cardiology and get the fluids and potassium started.  After x-ray and some other labs, will admit to medicine at [facility].  Patient agrees with this plan.  .8:25 PM Workup continues to return.  Magnesium is low will replete.  BNP elevated which is new.  Unclear if  there is a degree of strain on the heart with the arrhythmias but her troponin was normal.  CK normal.  TSH normal.  Her x-ray does show to some pneumonia.  Will order antibiotics and call for medical admission.  Patient's heart rate has improved.  Will also give some Tylenol for this fever she has now spiked.  Will call for medical admission.  9:19 PM Spoke to Dr. Julian Reil with medicine who requested we speak to the ICU due to her heart rate changes.  Given heart rate intermittently going up into the 160s still, will call the ICU.  I spoke to the ICU who would like to see her in person to determine if she needs ICU or stepdown.  Will do ED to ED transfer to [facility] for evaluation.  Will give her another dose of labetalol.  9:28 PM Spoke to Dr. Chelsea Aus who accepts the patient for ED to ED transfer to then be evaluated by critical care and medicine to determine level of care needs.  Patient will go by transport.        Final Clinical Impression(s) / ED Diagnoses Final diagnoses:  Upper respiratory tract infection, unspecified type  Palpitations  Tachycardia  Irregular heart rhythm  Nausea and vomiting, unspecified vomiting type  Community acquired pneumonia, unspecified laterality     Clinical Impression: 1. Upper respiratory tract infection, unspecified type   2. Palpitations   3. Tachycardia   4. Irregular heart rhythm   5. Nausea and vomiting, unspecified vomiting type   6. Community acquired pneumonia, unspecified laterality     Disposition: Admit  This note was prepared with assistance of Conservation officer, historic buildings. Occasional wrong-word or sound-a-like substitutions may have occurred due to the inherent limitations of voice recognition software.     Elza Sortor, Canary Brim,  MD 03/08/23 2209

## 2023-03-08 NOTE — ED Notes (Signed)
[facility] charge nurse [facility] ED given report

## 2023-03-08 NOTE — ED Notes (Signed)
Defib pads placed on patient

## 2023-03-08 NOTE — H&P (Incomplete)
History and Physical    [name] UVO:[id number] DOB: 17-May-1955 DOA: 03/08/2023  PCP: Jarrett Soho, PA-C   Patient coming from: Home   Chief Complaint:  Chief Complaint  Patient presents with  . Emesis   ED TRIAGE note:Pt arrived POV caox4, ambulatory c/o bodyaches that started yesterday and vomiting that started this morning, also c/o chills, congestion, productive cough. Vomiting in triage. Pt transferred here from [facility]. Pt reported to have runs of Vtac which initiated transfer. Pt's electrolytes replenished at [facility], no further arhythmias reported   HPI:  [name] is a 67 y.o. female with medical history significant of hypertension, hyperlipidemia, CAD, non-insulin-dependent DM type II and obesity initially presented to [facility] emergency department that transferred to [facility] emergency department.   Initial admission has been deferred as patient has significant ventricular tachycardia.  Patient initially presented with nausea and vomiting for last 2 days described his current send.  Reported normal urination.She endorsed chills, cough, and fever as well as symptoms of URI on presentation. Patient endorsed myalgias as well as malaise. She has experienced intermittent palpitations. She denied any abdominal pain, diarrhea, headache, or sore throat at the outside ED. Patient was noted to have intermittent tachycardia with arrhythmia as well as electrolyte abnormalities. Patient also desatted to 88% on room air in the outside ED. Initially cardiology has been consulted recommended to admit patient under hospitalist care.  Also ICU has been consulted for severe electrolyte derangement and ventricular tachycardia.   In the ED patient received IV labetalol 5 mg one-time dose for arrhythmia.  She was chronically on oral metoprolol.  For the treatment of pneumonia in the ED patient received IV Rocephin, Zithromax Tylenol IV potassium chloride and mag  sulfate.  ICU provider Dr. Jamison Neighbor has been consulted and recommended to continue to replete electrolytes and continue home metoprolol for the management of arrhythmia.  Continue to treat for pneumonia as well.  Hospitalist has been contacted for further evaluation management of ventricular arrhythmia, and [facility] pneumonia.  ED Course:  Initial presentation to ED patient heart rate 110.  Blood pressure 153/79 and respiratory 22.  O2 sat 94% room air. Initial EKG showing ventricular tachycardia heart rate 184. High sensitive troponin 13. CK 59. TSH within normal range. Low magnesium 1.6. Elevated BNP 122.1. COVID PCR negative. UA leukocyte esterase positive.  No bacteria. CBC showing slight leukocytosis 12.5 otherwise unremarkable. CMP showing low sodium 132, low potassium 3.1, otherwise unremarkable.  Chest x-ray showing Patchy right mid and lower lung zone pulmonary infiltrate, possibly infectious in the appropriate clinical setting.   Review of Systems:  ROS  Past Medical History:  Diagnosis Date  . GERD (gastroesophageal reflux disease)   . High cholesterol   . Hypertension   . 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 Osteoporosis    T SCORE OF -2.7 ON DEXA IN 2013  . Squamous cell carcinoma of skin 01/04/2021   in situ- right post crown (CX35FU)    Past Surgical History:  Procedure Laterality Date  . APPENDECTOMY    . CHOLECYSTECTOMY    . TONSILLECTOMY       reports that she quit smoking about 14 years ago. Her smoking use included cigarettes. She started smoking about 49 years ago. She has a 87.5 pack-year smoking history. She has never used smokeless tobacco. She reports that she does not drink alcohol and does not use drugs.  Allergies  Allergen Reactions  . Codeine Nausea And Vomiting  Family History  Problem Relation Age of Onset  . Colon polyps Mother   . Hypertension Mother   . Kidney disease Mother   . Microcephaly Father   . CAD Father   .  Heart disease Father   . Heart disease Sister        atrial fibrillation  . Atrial fibrillation Sister   . Atrial fibrillation Brother   . Heart attack Brother        died 42  . Colon cancer Neg Hx   . Liver disease Neg Hx     Prior to Admission medications   Medication Sig Start Date End Date Taking? Authorizing Provider  acetaminophen (TYLENOL) 500 MG tablet Take 1,000 mg by mouth 2 (two) times daily as needed for moderate pain.    [provider]  cephALEXin (KEFLEX) 500 MG capsule Take 1 capsule (500 mg total) by mouth every 8 (eight) hours. 04/17/22   Leroy Sea, MD  dexlansoprazole (DEXILANT) 60 MG capsule Take 60 mg by mouth daily.    [provider]  fluticasone (FLONASE) 50 MCG/ACT nasal spray Place 2 sprays into both nostrils daily.    [provider]  losartan (COZAAR) 100 MG tablet Take 100 mg by mouth daily.    [provider]  metFORMIN (GLUCOPHAGE) 500 MG tablet Take 500 mg by mouth daily.    [provider]  metoprolol tartrate (LOPRESSOR) 25 MG tablet Take 0.5 tablets (12.5 mg total) by mouth 2 (two) times daily. Patient taking differently: Take 25 mg by mouth daily. 04/20/16   End, Cristal Deer, MD  RINVOQ 15 MG TB24 Take 15 mg by mouth at bedtime. 03/22/22   [provider]  rosuvastatin (CRESTOR) 10 MG tablet Take 10 mg by mouth daily.    [provider]  triazolam (HALCION) 0.25 MG tablet Take 0.25 mg by mouth at bedtime.    [provider]     Physical Exam: Vitals:   03/08/23 2145 03/08/23 2215 03/08/23 2249 03/08/23 2250  BP: (!) 140/73 129/75 139/76   Pulse: 94 94 84 91  Resp: (!) 22 (!) 22  (!) 25  Temp:      TempSrc:      SpO2: 98% 99% 91% 93%    Physical Exam   Labs on Admission: I have personally reviewed following labs and imaging studies  CBC: Recent Labs  Lab 03/08/23 1609  WBC 12.5*  HGB 12.8  HCT 38.1  MCV 82.6  PLT 274   Basic Metabolic Panel: Recent Labs   Lab 03/08/23 1609 03/08/23 1929  NA 132*  --   K 3.1*  --   CL 98  --   CO2 24  --   GLUCOSE 136*  --   BUN 6*  --   CREATININE 0.96  --   CALCIUM 9.3  --   MG  --  1.6*   GFR: CrCl cannot be calculated (Unknown ideal weight.). Liver Function Tests: Recent Labs  Lab 03/08/23 1609  AST 20  ALT 17  ALKPHOS 76  BILITOT 0.8  PROT 7.5  ALBUMIN 4.6   Recent Labs  Lab 03/08/23 1609  LIPASE <10*   No results for input(s): "AMMONIA" in the last 168 hours. Coagulation Profile: No results for input(s): "INR", "PROTIME" in the last 168 hours. Cardiac Enzymes: Recent Labs  Lab 03/08/23 1929 03/08/23 2252  CKTOTAL 59  --   TROPONINIHS 12 13   BNP (last 3 results) Recent Labs    04/16/22  3295 04/17/22 0747 03/08/23 1929  BNP 39.2 73.0 122.1*   HbA1C: No results for input(s): "HGBA1C" in the last 72 hours. CBG: No results for input(s): "GLUCAP" in the last 168 hours. Lipid Profile: No results for input(s): "CHOL", "HDL", "LDLCALC", "TRIG", "CHOLHDL", "LDLDIRECT" in the last 72 hours. Thyroid Function Tests: Recent Labs    03/08/23 1929  TSH 0.458   Anemia Panel: No results for input(s): "VITAMINB12", "FOLATE", "FERRITIN", "TIBC", "IRON", "RETICCTPCT" in the last 72 hours. Urine analysis:    Component Value Date/Time   COLORURINE YELLOW 03/08/2023 1609   APPEARANCEUR CLEAR 03/08/2023 1609   LABSPEC 1.018 03/08/2023 1609   PHURINE 6.5 03/08/2023 1609   GLUCOSEU NEGATIVE 03/08/2023 1609   HGBUR NEGATIVE 03/08/2023 1609   HGBUR negative 09/27/2009 0809   BILIRUBINUR NEGATIVE 03/08/2023 1609   KETONESUR 15 (A) 03/08/2023 1609   PROTEINUR 30 (A) 03/08/2023 1609   UROBILINOGEN 0.2 11/04/2009 1806   NITRITE NEGATIVE 03/08/2023 1609   LEUKOCYTESUR SMALL (A) 03/08/2023 1609    Radiological Exams on Admission: I have personally reviewed images DG Chest Portable 1 View  Result Date: 03/08/2023 CLINICAL DATA:  Cough EXAM: PORTABLE CHEST 1 VIEW COMPARISON:   04/16/2022 FINDINGS: Patchy pulmonary infiltrate is seen within the right mid and lower lung zone, possibly infectious in the appropriate clinical setting. No definite pneumothorax or pleural effusion. Cardiac size within normal limits. Pulmonary vascularity is normal. No acute bone abnormality. IMPRESSION: 1. Patchy right mid and lower lung zone pulmonary infiltrate, possibly infectious in the appropriate clinical setting. Electronically Signed   By: Helyn Numbers M.D.   On: 03/08/2023 20:22    EKG: My personal interpretation of EKG shows: ***    Assessment/Plan: Active Problems:   * No active hospital problems. *    Assessment and Plan: No notes have been filed under this hospital service. Service: Hospitalist      DVT prophylaxis:  {Blank single:19197::"Lovenox","SQ Heparin","IV heparin gtts","Xarelto","Eliquis","Coumadin","SCDs","***"} Code Status:  {Blank single:19197::"Full Code","DNR with Intubation","DNR/DNI(Do NOT Intubate)","Comfort Care","***"} Diet:  Family Communication:  *** Family was present at bedside, at the time of interview.  Opportunity was given to ask question and all questions were answered satisfactorily.  Disposition Plan:  ***  Consults:  ***  Admission status:   {Blank single:19197::"Observation","Inpatient"}, {Blank single:19197::"Med-Surg","Telemetry bed","Step Down Unit"}  Severity of Illness: {Observation/Inpatient:21159}    Tereasa Coop, MD [facility]  How to contact the [facility] Attending or Consulting provider 7A - 7P or covering provider during after hours 7P -7A, for this patient.  Check the care team in [facility] and look for a) attending/consulting [facility] provider listed and b) the [facility] team listed Log into [email or web address] and use [place]'s universal password to access. If you do not have the password, please contact the hospital operator. Locate the [facility] provider you are looking for under [facility] and page to a number that you can  be directly reached. If you still have difficulty reaching the provider, please page the [facility] (Director on Call) for the Hospitalists listed on amion for assistance.  03/08/2023, 11:59 PM

## 2023-03-08 NOTE — Consult Note (Signed)
NAME:  [name], MRN:  [id number], DOB:  1955/07/08, LOS: 0 ADMISSION DATE:  03/08/2023, CONSULTATION DATE:  03/08/23 REFERRING MD:  Paris Lore, CHIEF COMPLAINT:  Arrhythmia    History of Present Illness:  I was contacted by Dr. Cristal Deer Tegeler at [facility] to consider the patient for possible ICU admission.  Patient presented earlier this evening with severe nausea and vomiting for 2 days duration that was described as constant.  Patient reported normal urination.  She endorsed chills, cough, and fever as well as symptoms of URI on presentation.  Patient endorsed myalgias as well as malaise.  She has experienced intermittent palpitations.  She denied any abdominal pain, diarrhea, headache, or sore throat at the outside ED.  Patient was noted to have intermittent tachycardia with arrhythmia as well as electrolyte abnormalities.  The case was discussed with cardiology as well as the admitting hospitalist for [facility].  Given the patient's arrhythmia hospitalist service was concerned with regards to the patient's safety with admission to the floor or stepdown unit.  Thus, critical care consult was requested.  At outside facility patient was screened for COVID-19.  Notably IV labetalol was administered for the patient's arrhythmia.  She is chronically on oral metoprolol.  In the emergency department the patient was given IV Rocephin, Zithromax, acetaminophen, a total of 20 mEq IV potassium chloride, and magnesium sulfate 2 g IV as well as 1 L of normal saline bolus.  On my evaluation the patient reports her breathing is okay.  She confirms she has had a intermittent nonproductive cough as well as audible wheezing at times.  She denies any prior history of lung disease including asthma, COPD, chronic bronchitis, or emphysema.  She confirms she had no diarrhea with her nausea and vomiting.  She confirms she did have subjective fever, chills, and sweats at home.   The patient has had a mild headache but denies any syncope or near syncope.  She only began to experience intermittent palpitations this evening after arriving in the outside emergency department.  She confirms she has had no recent sick contacts.  She has no recent travel or antibiotic exposure.  Significant Hospital Events: Including procedures, antibiotic start and stop dates in addition to other pertinent events   10/24 - Presented to outside ED >> Transferred to [facility] ED for PCCM eval  Interim History / Subjective:  N/A  Objective   Blood pressure 139/76, pulse 91, temperature 97.7 F (36.5 C), temperature source Oral, resp. rate (!) 25, SpO2 93%.        Intake/Output Summary (Last 24 hours) at 03/08/2023 2354 Last data filed at 03/08/2023 2215 Gross per 24 hour  Intake 611.66 ml  Output --  Net 611.66 ml   There were no vitals filed for this visit.  Examination: General:  Awake. Alert. No acute distress.  Elderly, Caucasian female.  No family at bedside.  Integument:  Warm & dry. No rash on exposed skin. Extremities:  No cyanosis or clubbing.  Lymphatics:  No appreciated cervical or supraclavicular lymphadenoapthy. HEENT:  Moist mucus membranes. No oral ulcers. No scleral injection or icterus. Cardiovascular:  Regular rate. Regular rhythm.  Sinus rhythm on telemetry.  No edema. No murmur appreciated.  Pulmonary:  Good aeration. Clear to auscultation bilaterally. Symmetric chest wall expansion. No accessory muscle use on nasal cannula oxygen at 2 L/min saturating 95 to 97%. Abdomen: Soft. Normoactive bowel sounds. Protuberant. Grossly nontender.  No voluntary guarding. Musculoskeletal:  Normal bulk  and tone. Hand grip strength 5/5 bilaterally. No joint deformity or effusion appreciated. Neurological:  CN 2-12 grossly in tact. No meningismus. Moving all 4 extremities equally.  Psychiatric:  Mood and affect congruent. Speech normal rhythm, rate & tone. Oriented x4.  Resolved  Hospital Problem list   N/A  Assessment & Plan:  67 year old female with a known history of tobacco use remotely.  Patient presented to outside ED with clinical picture consistent with sepsis.  Patient notably with hypokalemia and hypomagnesemia that likely triggered her arrhythmia.  At the time of my evaluation her palpitations and rhythm abnormalities seem to have improved significantly following IV potassium and IV magnesium replacement.  Reportedly she did desaturate to 88% on room air at outside ED but has stable saturation on minimal nasal cannula oxygen at the time of my evaluation.  Her blood pressure is stable as is her respiratory status.  I did discuss her plan of care and my recommendations with her ED provider here at our hospital.  1.  Arrhythmia: Present at outside ED.  Likely a function of patient's electrolyte abnormalities.  Recommend continuing telemetry monitoring and replacing magnesium and potassium to >/= 2.0 and >/= 4.0 respectively.  Recommend continuing home metoprolol and if necessary utilizing IV Lopressor.  2.  Sepsis: Secondary to community-acquired pneumonia of the right lung.  Recommend treatment as well as workup of pneumonia as follows.  Recommend checking procalcitonin and lactic acid.  3.  Community-acquired pneumonia of the right lung: Recommend checking urine streptococcal and Legionella antigens.  Recommend continuing empiric antimicrobial therapy with Rocephin and Zithromax.  4.  Hypomagnesemia and hypokalemia: Repletion was started at outside by the ED. Recommend continuing telemetry monitoring and replacing magnesium and potassium to >/= 2.0 and >/= 4.0 respectively.  5.  CODE STATUS: I confirmed full CODE STATUS with the patient at the time of my evaluation.  6.  Disposition: Patient does not require ICU admission at this time.  Recommend admission by hospitalist to either PCU/stepdown or floor with telemetry monitoring.  Remainder of care as per admitting  hospitalist and other consultants.  PCCM will sign off at this time and be available as needed.  Please let us know if we can be of any further assistance in the care of this patient before her discharge.  Best Practice (right click and "Reselect all SmartList Selections" daily)   Lines: N/A Foley:  N/A Code Status:  full code Last date of multidisciplinary goals of care discussion [Pending]  Labs   CBC: Recent Labs  Lab 03/08/23 1609  WBC 12.5*  HGB 12.8  HCT 38.1  MCV 82.6  PLT 274    Basic Metabolic Panel: Recent Labs  Lab 03/08/23 1609 03/08/23 1929  NA 132*  --   K 3.1*  --   CL 98  --   CO2 24  --   GLUCOSE 136*  --   BUN 6*  --   CREATININE 0.96  --   CALCIUM 9.3  --   MG  --  1.6*   GFR: CrCl cannot be calculated (Unknown ideal weight.). Recent Labs  Lab 03/08/23 1609  WBC 12.5*    Liver Function Tests: Recent Labs  Lab 03/08/23 1609  AST 20  ALT 17  ALKPHOS 76  BILITOT 0.8  PROT 7.5  ALBUMIN 4.6   Recent Labs  Lab 03/08/23 1609  LIPASE <10*   No results for input(s): "AMMONIA" in the last 168 hours.  ABG    Component Value Date/Time  TCO2 25 01/09/2017 1927     Coagulation Profile: No results for input(s): "INR", "PROTIME" in the last 168 hours.  Cardiac Enzymes: Recent Labs  Lab 03/08/23 1929  CKTOTAL 59    HbA1C: Hgb A1c MFr Bld  Date/Time Value Ref Range Status  08/09/2021 04:42 AM 5.9 (H) 4.8 - 5.6 % Final    Comment:    (NOTE) Pre diabetes:          5.7%-6.4%  Diabetes:              >6.4%  Glycemic control for   <7.0% adults with diabetes   01/10/2017 06:26 AM 5.7 (H) 4.8 - 5.6 % Final    Comment:    (NOTE) Pre diabetes:          5.7%-6.4% Diabetes:              >6.4% Glycemic control for   <7.0% adults with diabetes     CBG: No results for input(s): "GLUCAP" in the last 168 hours.  IMAGING: PORT CXR 03/08/23 (personally reviewed by me): Trace amount of fluid in right fissure.  Very slight blunting  of bilateral costophrenic angles suggestive of possible very small pleural effusions.  Slight hyperinflation with flattening of the diaphragms bilaterally.  Patchy alveolar filling in the right mid to lower lung zone without focal area of consolidation.  Trachea midline.  Heart normal in size.  Mediastinal contour normal.  EKG 03/08/23 (personally reviewed by me): Underlying sinus rhythm with intermittent wide-complex tachycardia.  No clear ST segment changes to suggest ischemia.  Review of Systems:   No rashes or abnormal bruising.  No dysuria or hematuria.  A pertinent 14 point review of systems is negative except as per the HPI.  Past Medical History:  She,  has a past medical history of GERD (gastroesophageal reflux disease), High cholesterol, Hypertension, Lichenoid dermatitis, Osteoporosis, and Squamous cell carcinoma of skin (01/04/2021).   Surgical History:   Past Surgical History:  Procedure Laterality Date   APPENDECTOMY     CHOLECYSTECTOMY     TONSILLECTOMY       Social History:   reports that she quit smoking about 14 years ago. Her smoking use included cigarettes. She started smoking about 49 years ago. She has a 87.5 pack-year smoking history. She has never used smokeless tobacco. She reports that she does not drink alcohol and does not use drugs.   Family History:  Her family history includes Atrial fibrillation in her brother and sister; CAD in her father; Colon polyps in her mother; Heart attack in her brother; Heart disease in her father and sister; Hypertension in her mother; Kidney disease in her mother; Microcephaly in her father. There is no history of Colon cancer or Liver disease.   Allergies Allergies  Allergen Reactions   Codeine Nausea And Vomiting     Home Medications  Prior to Admission medications   Medication Sig Start Date End Date Taking? Authorizing Provider  acetaminophen (TYLENOL) 500 MG tablet Take 1,000 mg by mouth 2 (two) times daily as needed  for moderate pain.    [provider]  cephALEXin (KEFLEX) 500 MG capsule Take 1 capsule (500 mg total) by mouth every 8 (eight) hours. 04/17/22   Leroy Sea, MD  dexlansoprazole (DEXILANT) 60 MG capsule Take 60 mg by mouth daily.    [provider]  fluticasone (FLONASE) 50 MCG/ACT nasal spray Place 2 sprays into both nostrils daily.    [provider]  losartan (COZAAR)  100 MG tablet Take 100 mg by mouth daily.    [provider]  metFORMIN (GLUCOPHAGE) 500 MG tablet Take 500 mg by mouth daily.    [provider]  metoprolol tartrate (LOPRESSOR) 25 MG tablet Take 0.5 tablets (12.5 mg total) by mouth 2 (two) times daily. Patient taking differently: Take 25 mg by mouth daily. 04/20/16   End, Cristal Deer, MD  RINVOQ 15 MG TB24 Take 15 mg by mouth at bedtime. 03/22/22   [provider]  rosuvastatin (CRESTOR) 10 MG tablet Take 10 mg by mouth daily.    [provider]  triazolam (HALCION) 0.25 MG tablet Take 0.25 mg by mouth at bedtime.    [provider]

## 2023-03-08 NOTE — ED Triage Notes (Signed)
Pt transferred here from [facility]. Pt reported to have runs of Vtac which initiated transfer. Pt's electrolytes replenished at [facility], no further arhythmias reported

## 2023-03-08 NOTE — ED Provider Notes (Signed)
  Physical Exam  BP 139/76   Pulse 91   Temp 97.7 F (36.5 C) (Oral)   Resp (!) 25   SpO2 93%   Physical Exam  Procedures  Procedures  ED Course / MDM   Clinical Course as of 03/09/23 0008  Thu Mar 08, 2023  2257 Consult to Dr. Jamison Neighbor, PCCM, who is agreeable to consulting on this patient in the ED. I appreciate his collaboration in the care of this patient.  [RS]  2331 PCCM physician states that patient is stable for admission to stepdown unit at this time, [RS]  Fri Mar 09, 2023  0005 Consult to Dr. Janalyn Shy, hospitalist, who is agreeable to admitting this patient to her service. I appreciate her collaboration in the care of this patient.  [RS]    Clinical Course User Index [RS] Nathasha Fiorillo, Eugene Gavia, PA-C   Medical Decision Making Amount and/or Complexity of Data Reviewed Labs: ordered. Radiology: ordered.  Risk OTC drugs. Prescription drug management. Decision regarding hospitalization.    Care of this patient assumed from preceding ED provider Dr. Rush Landmark after patient was transferred for ED to ED transfer from [facility]. In brief, patient has improving pneumonia, new oxygen requirement and additionally was found to be having episodes of SVT.  Potassium and magnesium are repleted outpatient facility the patient was transferred for PCCM consultation to determine if she needs the ICU or is stable for stepdown unit.  PCCM consultation as above, patient stable for stepdown.  Pending admission to hospital medicine.  Consult hospitalist as above.  Patient had her electrolytes repleted at preceding ED and appears to have been cardiac stabilized at this time.  Asymptomatic resting comfortably in her bed, remains on supplemental oxygen at this time.  Drinda Butts voiced understanding of her medical evaluation and treatment plan. Each of their questions answered to their expressed satisfaction.  She is amenable to admission at this time.   This chart was dictated using voice  recognition software, Dragon. Despite the best efforts of this provider to proofread and correct errors, errors may still occur which can change documentation meaning.       Paris Lore, PA-C 03/09/23 0008    Maia Plan, MD 03/13/23 1049

## 2023-03-08 NOTE — ED Triage Notes (Signed)
Pt arrived POV caox4, ambulatory c/o bodyaches that started yesterday and vomiting that started this morning, also c/o chills, congestion, productive cough. Vomiting in triage.

## 2023-03-08 NOTE — H&P (Addendum)
History and Physical    [name] [facility] FAO:[id number] DOB: Jul 30, 1955 DOA: 03/08/2023  PCP: Jarrett Soho, PA-C   Patient coming from: Home   Chief Complaint:  Chief Complaint  Patient presents with   Emesis   ED TRIAGE note:Pt arrived POV caox4, ambulatory c/o bodyaches that started yesterday and vomiting that started this morning, also c/o chills, congestion, productive cough. Vomiting in triage. Pt transferred here from [facility]. Pt reported to have runs of Vtac which initiated transfer. Pt's electrolytes replenished at [facility], no further arhythmias reported   HPI:  [name] is a 67 y.o. female with medical history significant of hypertension, hyperlipidemia, CAD, non-insulin-dependent DM type II and obesity initially presented to [facility] emergency department that transferred to [facility] emergency department.   Initial admission has been deferred as patient has significant ventricular tachycardia.  Patient initially presented with nausea and vomiting for last 2 days described his current send.  Reported normal urination.She endorsed chills, cough, and fever as well as symptoms of URI on presentation. Patient endorsed myalgias as well as malaise. She has experienced intermittent palpitations. She denied any abdominal pain, diarrhea, headache, or sore throat at the outside ED. Patient was noted to have intermittent tachycardia with arrhythmia as well as electrolyte abnormalities. Patient also desatted to 88% on room air in the outside ED. Initially cardiology has been consulted recommended to admit patient under hospitalist care.  Also ICU has been consulted for severe electrolyte derangement and ventricular tachycardia.   In the ED patient received IV labetalol 5 mg one-time dose for arrhythmia.  She was chronically on oral metoprolol.  For the treatment of pneumonia in the ED patient received IV Rocephin, Zithromax Tylenol IV potassium chloride and mag  sulfate.  ICU provider Dr. Jamison Neighbor has been consulted and recommended to continue to replete electrolytes and continue home metoprolol for the management of arrhythmia.  Continue to treat for pneumonia as well.  Hospitalist has been contacted for further evaluation management of ventricular arrhythmia, and [facility] pneumonia.  ED Course:  Initial presentation to ED patient heart rate 110.  Blood pressure 153/79 and respiratory 22.  O2 sat 94% room air. Initial EKG showing ventricular tachycardia heart rate 184. High sensitive troponin 13. CK 59. TSH within normal range. Low magnesium 1.6. Elevated BNP 122.1. COVID PCR negative. UA leukocyte esterase positive.  No bacteria. CBC showing slight leukocytosis 12.5 otherwise unremarkable. CMP showing low sodium 132, low potassium 3.1, otherwise unremarkable.  Chest x-ray showing Patchy right mid and lower lung zone pulmonary infiltrate, possibly infectious in the appropriate clinical setting.   Review of Systems:  Review of Systems  Constitutional:  Negative for chills, fever, malaise/fatigue and weight loss.  Respiratory:  Negative for cough, sputum production and shortness of breath.   Cardiovascular:  Negative for chest pain, palpitations, claudication and leg swelling.  Gastrointestinal:  Positive for nausea and vomiting. Negative for abdominal pain, constipation, diarrhea and heartburn.  Genitourinary:  Negative for dysuria, frequency, hematuria and urgency.  Musculoskeletal:  Negative for back pain, myalgias and neck pain.  Neurological:  Negative for dizziness, loss of consciousness and headaches.  Psychiatric/Behavioral:  The patient is not nervous/anxious.     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 Her smoking use included cigarettes. She started smoking about 49 years ago. She has a 87.5 pack-year smoking history. She has never used smokeless tobacco. She reports that she does not drink alcohol and does not use drugs.  Allergies  Allergen Reactions   Codeine Nausea And Vomiting   Molnupiravir Diarrhea    Family History  Problem Relation Age of Onset   Colon polyps Mother    Hypertension Mother    Kidney disease Mother    Microcephaly Father    CAD Father    Heart disease Father    Heart disease Sister        atrial fibrillation   Atrial fibrillation Sister    Atrial fibrillation Brother    Heart attack Brother        died 49   Colon cancer Neg Hx    Liver disease Neg Hx     Prior to Admission medications   Medication Sig Start Date End Date Taking? Authorizing Provider  acetaminophen (TYLENOL) 500 MG tablet Take 1,000 mg by mouth 2 (two) times daily as needed for moderate pain.    [provider]  cephALEXin (KEFLEX) 500 MG capsule Take 1 capsule (500 mg total) by mouth every 8 (eight) hours. 04/17/22   Leroy Sea, MD  dexlansoprazole (DEXILANT) 60 MG capsule Take 60 mg by mouth daily.    [provider]  fluticasone (FLONASE) 50 MCG/ACT nasal spray Place 2 sprays into both nostrils daily.    [provider]  losartan (COZAAR) 100 MG tablet Take 100 mg by mouth daily.    [provider]  metFORMIN (GLUCOPHAGE) 500 MG tablet Take 500 mg by mouth daily.    [provider]  metoprolol tartrate (LOPRESSOR) 25 MG tablet Take 0.5 tablets (12.5 mg total) by mouth 2 (two) times daily. Patient taking differently: Take 25 mg by mouth daily. 04/20/16   End, Cristal Deer, MD  RINVOQ 15 MG TB24 Take 15 mg by mouth at bedtime. 03/22/22    [provider]  rosuvastatin (CRESTOR) 10 MG tablet Take 10 mg by mouth daily.    [provider]  triazolam (HALCION) 0.25 MG tablet Take 0.25 mg by mouth at bedtime.    [provider]     Physical Exam: Vitals:   03/09/23 0240 03/09/23 0245 03/09/23 0300 03/09/23 0315  BP:  (!) 158/76 (!) 155/72 (!) 161/78  Pulse:  90 83 82  Resp:  (!) 28 (!) 33 (!) 29  Temp: 98.8 F (37.1 C)     TempSrc: Oral     SpO2:  96% 97% 97%  Weight:      Height:        Physical Exam Constitutional:      General: She is not in acute distress.    Appearance: She is not ill-appearing.  HENT:     Mouth/Throat:     Mouth: Mucous membranes are moist.  Eyes:     Conjunctiva/sclera: Conjunctivae normal.  Cardiovascular:     Rate and Rhythm: Normal rate and regular rhythm.     Pulses: Normal pulses.     Heart sounds: Normal heart sounds.  Pulmonary:     Effort: Pulmonary effort is normal.     Breath  sounds: Normal breath sounds.  Abdominal:     General: Bowel sounds are normal.  Musculoskeletal:     Right lower leg: No edema.     Left lower leg: No edema.  Skin:    Capillary Refill: Capillary refill takes less than 2 seconds.  Neurological:     Mental Status: She is oriented to person, place, and time.  Psychiatric:        Mood and Affect: Mood normal.      Labs on Admission: I have personally reviewed following labs and imaging studies  CBC: Recent Labs  Lab 03/08/23 1609 03/09/23 0145  WBC 12.5* 10.8*  HGB 12.8 12.4  HCT 38.1 37.0  MCV 82.6 83.7  PLT 274 244   Basic Metabolic Panel: Recent Labs  Lab 03/08/23 1609 03/08/23 1929 03/09/23 0145  NA 132*  --  135  K 3.1*  --  3.3*  CL 98  --  102  CO2 24  --  20*  GLUCOSE 136*  --  141*  BUN 6*  --  6*  CREATININE 0.96  --  0.77  CALCIUM 9.3  --  8.1*  MG  --  1.6* 2.4   GFR: Estimated Creatinine Clearance: 63.6 mL/min (by C-G formula based on SCr of 0.77 mg/dL). Liver Function Tests: Recent  Labs  Lab 03/08/23 1609  AST 20  ALT 17  ALKPHOS 76  BILITOT 0.8  PROT 7.5  ALBUMIN 4.6   Recent Labs  Lab 03/08/23 1609  LIPASE <10*   No results for input(s): "AMMONIA" in the last 168 hours. Coagulation Profile: No results for input(s): "INR", "PROTIME" in the last 168 hours. Cardiac Enzymes: Recent Labs  Lab 03/08/23 1929 03/08/23 2252  CKTOTAL 59  --   TROPONINIHS 12 13   BNP (last 3 results) Recent Labs    04/16/22 0233 04/17/22 0747 03/08/23 1929  BNP 39.2 73.0 122.1*   HbA1C: Recent Labs    03/09/23 0145  HGBA1C 5.9*   CBG: Recent Labs  Lab 03/09/23 0245  GLUCAP 137*   Lipid Profile: No results for input(s): "CHOL", "HDL", "LDLCALC", "TRIG", "CHOLHDL", "LDLDIRECT" in the last 72 hours. Thyroid Function Tests: Recent Labs    03/08/23 1929  TSH 0.458   Anemia Panel: No results for input(s): "VITAMINB12", "FOLATE", "FERRITIN", "TIBC", "IRON", "RETICCTPCT" in the last 72 hours. Urine analysis:    Component Value Date/Time   COLORURINE YELLOW 03/08/2023 1609   APPEARANCEUR CLEAR 03/08/2023 1609   LABSPEC 1.018 03/08/2023 1609   PHURINE 6.5 03/08/2023 1609   GLUCOSEU NEGATIVE 03/08/2023 1609   HGBUR NEGATIVE 03/08/2023 1609   HGBUR negative 09/27/2009 0809   BILIRUBINUR NEGATIVE 03/08/2023 1609   KETONESUR 15 (A) 03/08/2023 1609   PROTEINUR 30 (A) 03/08/2023 1609   UROBILINOGEN 0.2 11/04/2009 1806   NITRITE NEGATIVE 03/08/2023 1609   LEUKOCYTESUR SMALL (A) 03/08/2023 1609    Radiological Exams on Admission: I have personally reviewed images DG Abd 2 Views  Result Date: 03/09/2023 CLINICAL DATA:  Nausea and vomiting EXAM: ABDOMEN - 2 VIEW COMPARISON:  None Available. FINDINGS: The bowel gas pattern is normal. There is no evidence of free air. Cholecystectomy clips are present. Rounded calcifications in the pelvis are favored as phleboliths. The visualized lung bases are clear. No fractures are seen. IMPRESSION: Nonobstructive bowel gas  pattern. Electronically Signed   By: Darliss Cheney M.D.   On: 03/09/2023 01:07   DG Chest Portable 1 View  Result Date: 03/08/2023 CLINICAL DATA:  Cough EXAM:  PORTABLE CHEST 1 VIEW COMPARISON:  04/16/2022 FINDINGS: Patchy pulmonary infiltrate is seen within the right mid and lower lung zone, possibly infectious in the appropriate clinical setting. No definite pneumothorax or pleural effusion. Cardiac size within normal limits. Pulmonary vascularity is normal. No acute bone abnormality. IMPRESSION: 1. Patchy right mid and lower lung zone pulmonary infiltrate, possibly infectious in the appropriate clinical setting. Electronically Signed   By: Helyn Numbers M.D.   On: 03/08/2023 20:22    EKG: My personal interpretation of EKG shows: Initial EKG showing ventricular tachycardia heart rate 181.  At bedside cardiac monitoring showing heart rate has been converted to sinus rhythm most recent heart rate is 70-80.  Repeat EKG is pending.    Assessment/Plan: Principal Problem:   SVT (supraventricular tachycardia) (HCC) Active Problems:   CAP (community acquired pneumonia)   Essential hypertension   Nausea & vomiting   Hypokalemia   Hyponatremia   Non-insulin dependent type 2 diabetes mellitus (HCC)   Hypomagnesemia   Acute hypoxic respiratory failure (HCC)   History of CAD (coronary artery disease)    Assessment and Plan: Supraventricular tachycardia-resolved Hypokalemia Hypomagnesemia -Patient is presented to [facility] emergency department complaint of nausea vomiting.  EKG showing ventricular tachycardia heart rate 181.  In the ED patient received labetalol 5 mg IV once and heart rate has been converted to normal sinus rhythm.  ED provider has been consulted cardiology who recommended transfer patient to [facility] and admit under hospitalist service.  ICU also has been consulted for SVT.  Dr. Jamison Neighbor has evaluated patient and made recommendation.  Please see the consult note. -Normal TSH  level.  Troponin x 2 within normal range. - Checking UDS. -Currently patient has been converted to normal sinus rhythm. -Initial lab work showed low potassium 3.1 and low mag 1.6.  SVT in the setting of electrolyte derangement. - In the ED patient received KCl 91M EQ x 2 and mag sulfate 2 g. - Giving another dose of KCl 91M EQ x 2 and oral KCl 40 mEq.  Checking potassium and mag level.  Goal to keep K > 4 and mag > 2. -Resuming Lopressor 25 mg twice daily. - Obtaining echocardiogram - Continue cardiac monitoring. -Continue to monitor electrolytes closely and replete as needed. -Appreciate cardiology and intensivist input.   Nausea and vomiting -Presenting with persistent nausea and vomiting and poor oral intake for last 2 days.  Patient denies any abdominal pain, diarrhea and constipation..  Lipase within normal range.  History of cholecystectomy and appendectomy in the past. -During my evaluation bedside patient reported that her nausea and vomiting has been resolved and she is willing to try some oral food. - Abdominal x-ray nonobstructive bowel gas pattern.   Unclear etiology of nausea and vomiting at this time. -Encouraging oral food intake. Continue LR 100 cc/h for 1 day - Continue Zofran as needed  Acute hypoxic respiratory failure due to pneumonia Community acquired pneumonia -Patient O2 sat dropped to 88% on room air.  Currently maintaining 93% room air.  Patient spiked temperature 101.1 F, CBC showing leukocytosis 12.5.  Chest x-ray showing patchy right mid and lower lung pulmonary infiltrate concern for infection. - Checking lactic acid level, blood cultures x 2, sputum culture, urine Legionella and urine strep antigen.  Pending respiratory panel.  Checking procalcitonin level. - In the ED patient received ceftriaxone 1 g IV and azithromycin 500 mg. - Continue empiric treatment with IV ceftriaxone 2 g daily and doxycycline 100 mg twice daily. - Continue  monitor fever curve and  WBC.  Mild hyponatremia - Serum sodium is 132.  Patient is evolving on physical exam.  Chest x-ray no evidence of pulmonary edema and pulmonary vascular congestion.  For well exam no lower extremity swelling.  Slight elevated BNP 122.  Hyponatremia secondary to poor oral intake and on top of that persistent vomiting.  Continue to monitor sodium level.   Essential hypertension -Patient's blood pressure in the high range.  Systolic in between 1 50-1 60.  Most recent blood pressure is 125/65.  Continue Lopressor 25 mg daily.Holding hydrochlorothiazide and losartan in setting of hypokalemia. Addendum: -Elevated blood pressure 161/78.  Heart rate 82.  Hydralazine contraindicated in the setting of SVT. - Continue labetalol 5 mg Q8 as needed for SBP>160 or DBP >110 hold if heart rate < 80.   History of CAD -Patient used to be on aspirin in the past.  Not currently. -Continue metoprolol and Lipitor.  Non-insulin-dependent DM type II - A1c 5.9 in 07/2021 - At home patient is on metformin. - Holding metformin now - Continue SSI and at bedtime insulin as needed with POC blood glucose check. -Rechecking A1c level.  DVT prophylaxis:  Lovenox Code Status:  Full Code.  Verified with patient at the bedside. Diet: Heart healthy carb modified diet Family Communication: Currently no family member at bedside. Disposition Plan: Continue monitor electrolytes and improvement of nausea vomiting.  Tentative discharge to home next 2 to 3 days. Consults: Cardiology and ICU. Admission status:   Inpatient, progressive unit  Severity of Illness: The appropriate patient status for this patient is INPATIENT. Inpatient status is judged to be reasonable and necessary in order to provide the required intensity of service to ensure the patient's safety. The patient's presenting symptoms, physical exam findings, and initial radiographic and laboratory data in the context of their chronic comorbidities is felt to place  them at high risk for further clinical deterioration. Furthermore, it is not anticipated that the patient will be medically stable for discharge from the hospital within 2 midnights of admission.   * I certify that at the point of admission it is my clinical judgment that the patient will require inpatient hospital care spanning beyond 2 midnights from the point of admission due to high intensity of service, high risk for further deterioration and high frequency of surveillance required.Marland Kitchen    Tereasa Coop, MD [facility]  How to contact the [facility] Attending or Consulting provider 7A - 7P or covering provider during after hours 7P -7A, for this patient.  Check the care team in [facility] and look for a) attending/consulting [facility] provider listed and b) the [facility] team listed Log into [email or web address] and use [facility] universal password to access. If you do not have the password, please contact the hospital operator. Locate the [facility] provider you are looking for under [facility] and page to a number that you can be directly reached. If you still have difficulty reaching the provider, please page the [facility] (Director on Call) for the Hospitalists listed on amion for assistance.  03/09/2023, 3:48 AM

## 2023-03-08 NOTE — ED Notes (Signed)
Pt placed on 2L [place] for O2 saturations 88% at rest, improved to 98%.

## 2023-03-09 ENCOUNTER — Encounter (HOSPITAL_COMMUNITY): Payer: Self-pay | Admitting: Internal Medicine

## 2023-03-09 ENCOUNTER — Inpatient Hospital Stay (HOSPITAL_COMMUNITY): Payer: Medicare HMO

## 2023-03-09 DIAGNOSIS — I483 Typical atrial flutter: Secondary | ICD-10-CM | POA: Diagnosis not present

## 2023-03-09 DIAGNOSIS — Z79899 Other long term (current) drug therapy: Secondary | ICD-10-CM | POA: Diagnosis not present

## 2023-03-09 DIAGNOSIS — E7800 Pure hypercholesterolemia, unspecified: Secondary | ICD-10-CM | POA: Diagnosis not present

## 2023-03-09 DIAGNOSIS — I471 Supraventricular tachycardia, unspecified: Secondary | ICD-10-CM

## 2023-03-09 DIAGNOSIS — J9601 Acute respiratory failure with hypoxia: Secondary | ICD-10-CM | POA: Diagnosis not present

## 2023-03-09 DIAGNOSIS — E669 Obesity, unspecified: Secondary | ICD-10-CM | POA: Diagnosis not present

## 2023-03-09 DIAGNOSIS — I480 Paroxysmal atrial fibrillation: Secondary | ICD-10-CM | POA: Diagnosis not present

## 2023-03-09 DIAGNOSIS — I499 Cardiac arrhythmia, unspecified: Secondary | ICD-10-CM

## 2023-03-09 DIAGNOSIS — J189 Pneumonia, unspecified organism: Secondary | ICD-10-CM | POA: Diagnosis not present

## 2023-03-09 DIAGNOSIS — M810 Age-related osteoporosis without current pathological fracture: Secondary | ICD-10-CM | POA: Diagnosis not present

## 2023-03-09 DIAGNOSIS — I472 Ventricular tachycardia, unspecified: Secondary | ICD-10-CM | POA: Diagnosis not present

## 2023-03-09 DIAGNOSIS — E871 Hypo-osmolality and hyponatremia: Secondary | ICD-10-CM | POA: Diagnosis not present

## 2023-03-09 DIAGNOSIS — I2510 Atherosclerotic heart disease of native coronary artery without angina pectoris: Secondary | ICD-10-CM | POA: Diagnosis not present

## 2023-03-09 DIAGNOSIS — D649 Anemia, unspecified: Secondary | ICD-10-CM | POA: Diagnosis not present

## 2023-03-09 DIAGNOSIS — Z6834 Body mass index (BMI) 34.0-34.9, adult: Secondary | ICD-10-CM | POA: Diagnosis not present

## 2023-03-09 DIAGNOSIS — I4891 Unspecified atrial fibrillation: Secondary | ICD-10-CM | POA: Diagnosis not present

## 2023-03-09 DIAGNOSIS — Z85828 Personal history of other malignant neoplasm of skin: Secondary | ICD-10-CM | POA: Diagnosis not present

## 2023-03-09 DIAGNOSIS — I10 Essential (primary) hypertension: Secondary | ICD-10-CM | POA: Diagnosis not present

## 2023-03-09 DIAGNOSIS — A419 Sepsis, unspecified organism: Secondary | ICD-10-CM | POA: Diagnosis not present

## 2023-03-09 DIAGNOSIS — Z8679 Personal history of other diseases of the circulatory system: Secondary | ICD-10-CM | POA: Diagnosis not present

## 2023-03-09 DIAGNOSIS — Z1152 Encounter for screening for COVID-19: Secondary | ICD-10-CM | POA: Diagnosis not present

## 2023-03-09 DIAGNOSIS — R112 Nausea with vomiting, unspecified: Secondary | ICD-10-CM | POA: Diagnosis not present

## 2023-03-09 DIAGNOSIS — I4892 Unspecified atrial flutter: Secondary | ICD-10-CM | POA: Diagnosis not present

## 2023-03-09 DIAGNOSIS — K219 Gastro-esophageal reflux disease without esophagitis: Secondary | ICD-10-CM | POA: Diagnosis not present

## 2023-03-09 DIAGNOSIS — R1110 Vomiting, unspecified: Secondary | ICD-10-CM | POA: Diagnosis not present

## 2023-03-09 DIAGNOSIS — R918 Other nonspecific abnormal finding of lung field: Secondary | ICD-10-CM | POA: Diagnosis not present

## 2023-03-09 DIAGNOSIS — I4819 Other persistent atrial fibrillation: Secondary | ICD-10-CM | POA: Diagnosis not present

## 2023-03-09 DIAGNOSIS — E876 Hypokalemia: Secondary | ICD-10-CM | POA: Diagnosis not present

## 2023-03-09 DIAGNOSIS — I878 Other specified disorders of veins: Secondary | ICD-10-CM | POA: Diagnosis not present

## 2023-03-09 DIAGNOSIS — E119 Type 2 diabetes mellitus without complications: Secondary | ICD-10-CM | POA: Diagnosis not present

## 2023-03-09 DIAGNOSIS — R7989 Other specified abnormal findings of blood chemistry: Secondary | ICD-10-CM | POA: Diagnosis not present

## 2023-03-09 DIAGNOSIS — I4719 Other supraventricular tachycardia: Secondary | ICD-10-CM | POA: Diagnosis not present

## 2023-03-09 LAB — CBC
HCT: 37 % (ref 36.0–46.0)
Hemoglobin: 12.4 g/dL (ref 12.0–15.0)
MCH: 28.1 pg (ref 26.0–34.0)
MCHC: 33.5 g/dL (ref 30.0–36.0)
MCV: 83.7 fL (ref 80.0–100.0)
Platelets: 244 10*3/uL (ref 150–400)
RBC: 4.42 MIL/uL (ref 3.87–5.11)
RDW: 13.8 % (ref 11.5–15.5)
WBC: 10.8 10*3/uL — ABNORMAL HIGH (ref 4.0–10.5)
nRBC: 0 % (ref 0.0–0.2)

## 2023-03-09 LAB — ECHOCARDIOGRAM COMPLETE
AR max vel: 2.1 cm2
AV Area VTI: 2.25 cm2
AV Area mean vel: 2.04 cm2
AV Mean grad: 7 mm[Hg]
AV Peak grad: 13.1 mm[Hg]
Ao pk vel: 1.81 m/s
Area-P 1/2: 4.21 cm2
Height: 61 in
S' Lateral: 4 cm
Weight: 2751.34 [oz_av]

## 2023-03-09 LAB — RESPIRATORY PANEL BY PCR

## 2023-03-09 LAB — COMPREHENSIVE METABOLIC PANEL
ALT: 22 U/L (ref 0–44)
AST: 30 U/L (ref 15–41)
Albumin: 3.6 g/dL (ref 3.5–5.0)
Alkaline Phosphatase: 62 U/L (ref 38–126)
Anion gap: 12 (ref 5–15)
BUN: 6 mg/dL — ABNORMAL LOW (ref 8–23)
CO2: 21 mmol/L — ABNORMAL LOW (ref 22–32)
Calcium: 8.2 mg/dL — ABNORMAL LOW (ref 8.9–10.3)
Chloride: 102 mmol/L (ref 98–111)
Creatinine, Ser: 0.76 mg/dL (ref 0.44–1.00)
GFR, Estimated: >60 mL/min (ref >60)
Glucose, Bld: 117 mg/dL — ABNORMAL HIGH (ref 70–99)
Potassium: 4 mmol/L (ref 3.5–5.1)
Sodium: 135 mmol/L (ref 135–145)
Total Bilirubin: 0.5 mg/dL (ref 0.3–1.2)
Total Protein: 7 g/dL (ref 6.5–8.1)

## 2023-03-09 LAB — BASIC METABOLIC PANEL
Anion gap: 13 (ref 5–15)
BUN: 6 mg/dL — ABNORMAL LOW (ref 8–23)
CO2: 20 mmol/L — ABNORMAL LOW (ref 22–32)
Calcium: 8.1 mg/dL — ABNORMAL LOW (ref 8.9–10.3)
Chloride: 102 mmol/L (ref 98–111)
Creatinine, Ser: 0.77 mg/dL (ref 0.44–1.00)
GFR, Estimated: >60 mL/min (ref >60)
Glucose, Bld: 141 mg/dL — ABNORMAL HIGH (ref 70–99)
Potassium: 3.3 mmol/L — ABNORMAL LOW (ref 3.5–5.1)
Sodium: 135 mmol/L (ref 135–145)

## 2023-03-09 LAB — CBG MONITORING, ED
Glucose-Capillary: 137 mg/dL — ABNORMAL HIGH (ref 70–99)
Glucose-Capillary: 129 mg/dL — ABNORMAL HIGH (ref 70–99)
Glucose-Capillary: 128 mg/dL — ABNORMAL HIGH (ref 70–99)

## 2023-03-09 LAB — PROCALCITONIN: Procalcitonin: <0.1 ng/mL

## 2023-03-09 LAB — GLUCOSE, CAPILLARY
Glucose-Capillary: 120 mg/dL — ABNORMAL HIGH (ref 70–99)
Glucose-Capillary: 163 mg/dL — ABNORMAL HIGH (ref 70–99)

## 2023-03-09 LAB — HEMOGLOBIN A1C
Hgb A1c MFr Bld: 5.9 % — ABNORMAL HIGH (ref 4.8–5.6)
Mean Plasma Glucose: 122.63 mg/dL

## 2023-03-09 LAB — EXPECTORATED SPUTUM ASSESSMENT W GRAM STAIN, RFLX TO RESP C

## 2023-03-09 LAB — LACTIC ACID, PLASMA
Lactic Acid, Venous: 1.8 mmol/L (ref 0.5–1.9)
Lactic Acid, Venous: 2 mmol/L (ref 0.5–1.9)

## 2023-03-09 LAB — MAGNESIUM: Magnesium: 2.4 mg/dL (ref 1.7–2.4)

## 2023-03-09 LAB — MRSA NEXT GEN BY PCR, NASAL: MRSA by PCR Next Gen: NOT DETECTED

## 2023-03-09 MED ORDER — INSULIN ASPART 100 UNIT/ML IJ SOLN: 0.0000 [IU] | Freq: Every day | INTRAMUSCULAR | Status: DC

## 2023-03-09 MED ORDER — HEPARIN (PORCINE) 25000 UT/250ML-% IV SOLN
800.0000 [IU]/h | INTRAVENOUS | Status: DC
  Administered 2023-03-09: 900 [IU]/h via INTRAVENOUS
  Filled 2023-03-09: qty 250

## 2023-03-09 MED ORDER — METOPROLOL TARTRATE 25 MG PO TABS
25.0000 mg | ORAL_TABLET | Freq: Every day | ORAL | Status: DC
  Administered 2023-03-09 (×2): 25 mg via ORAL
  Filled 2023-03-09 (×2): qty 1

## 2023-03-09 MED ORDER — SODIUM CHLORIDE 0.9 % IV SOLN
100.0000 mg | Freq: Two times a day (BID) | INTRAVENOUS | Status: DC
  Administered 2023-03-09 – 2023-03-10 (×4): 100 mg via INTRAVENOUS
  Filled 2023-03-09 (×4): qty 100

## 2023-03-09 MED ORDER — ACETAMINOPHEN 650 MG RE SUPP: 650.0000 mg | Freq: Four times a day (QID) | RECTAL | Status: DC | PRN

## 2023-03-09 MED ORDER — SODIUM CHLORIDE 0.9 % IV SOLN: 2.0000 g | INTRAVENOUS | Status: DC

## 2023-03-09 MED ORDER — SODIUM CHLORIDE 0.9% FLUSH
3.0000 mL | Freq: Two times a day (BID) | INTRAVENOUS | Status: DC
  Administered 2023-03-09 – 2023-03-11 (×5): 3 mL via INTRAVENOUS

## 2023-03-09 MED ORDER — ONDANSETRON HCL 4 MG/2ML IJ SOLN
4.0000 mg | Freq: Four times a day (QID) | INTRAMUSCULAR | Status: DC | PRN
  Administered 2023-03-10: 4 mg via INTRAVENOUS
  Filled 2023-03-09: qty 2

## 2023-03-09 MED ORDER — ROSUVASTATIN CALCIUM 5 MG PO TABS
10.0000 mg | ORAL_TABLET | Freq: Every day | ORAL | Status: DC
  Administered 2023-03-09 – 2023-03-12 (×4): 10 mg via ORAL
  Filled 2023-03-09 (×4): qty 2

## 2023-03-09 MED ORDER — METOPROLOL TARTRATE 5 MG/5ML IV SOLN: 5.0000 mg | Freq: Once | INTRAVENOUS | Status: AC

## 2023-03-09 MED ORDER — LEVALBUTEROL HCL 0.63 MG/3ML IN NEBU
0.6300 mg | INHALATION_SOLUTION | Freq: Four times a day (QID) | RESPIRATORY_TRACT | Status: DC | PRN
  Administered 2023-03-11 (×2): 0.63 mg via RESPIRATORY_TRACT
  Filled 2023-03-09 (×2): qty 3

## 2023-03-09 MED ORDER — LABETALOL HCL 5 MG/ML IV SOLN: 5.0000 mg | Freq: Three times a day (TID) | INTRAVENOUS | Status: DC | PRN

## 2023-03-09 MED ORDER — SODIUM CHLORIDE 0.9 % IV SOLN
1.0000 g | Freq: Once | INTRAVENOUS | Status: AC
  Administered 2023-03-09: 1 g via INTRAVENOUS
  Filled 2023-03-09: qty 10

## 2023-03-09 MED ORDER — ENOXAPARIN SODIUM 40 MG/0.4ML IJ SOSY
40.0000 mg | PREFILLED_SYRINGE | INTRAMUSCULAR | Status: DC
  Administered 2023-03-09: 40 mg via SUBCUTANEOUS
  Filled 2023-03-09: qty 0.4

## 2023-03-09 MED ORDER — SODIUM CHLORIDE 0.9% FLUSH: 3.0000 mL | INTRAVENOUS | Status: DC | PRN

## 2023-03-09 MED ORDER — DILTIAZEM HCL-DEXTROSE 125-5 MG/125ML-% IV SOLN (PREMIX)
5.0000 mg/h | INTRAVENOUS | Status: DC
  Administered 2023-03-09: 10 mg/h via INTRAVENOUS
  Administered 2023-03-10: 5 mg/h via INTRAVENOUS
  Filled 2023-03-09: qty 125

## 2023-03-09 MED ORDER — AMLODIPINE BESYLATE 5 MG PO TABS: 5.0000 mg | ORAL_TABLET | Freq: Every day | ORAL | Status: DC

## 2023-03-09 MED ORDER — ACETAMINOPHEN 325 MG PO TABS
650.0000 mg | ORAL_TABLET | Freq: Four times a day (QID) | ORAL | Status: DC | PRN
  Administered 2023-03-09 (×2): 650 mg via ORAL
  Filled 2023-03-09 (×2): qty 2

## 2023-03-09 MED ORDER — KETOROLAC TROMETHAMINE 30 MG/ML IJ SOLN: 30.0000 mg | Freq: Once | INTRAMUSCULAR | Status: DC | PRN

## 2023-03-09 MED ORDER — INSULIN ASPART 100 UNIT/ML IJ SOLN
0.0000 [IU] | Freq: Three times a day (TID) | INTRAMUSCULAR | Status: DC
  Administered 2023-03-12: 1 [IU] via SUBCUTANEOUS

## 2023-03-09 MED ORDER — POTASSIUM CHLORIDE 20 MEQ PO PACK
40.0000 meq | PACK | Freq: Once | ORAL | Status: AC
  Administered 2023-03-09: 40 meq via ORAL
  Filled 2023-03-09: qty 2

## 2023-03-09 MED ORDER — HEPARIN BOLUS VIA INFUSION
3300.0000 [IU] | Freq: Once | INTRAVENOUS | Status: AC
  Administered 2023-03-09: 3300 [IU] via INTRAVENOUS
  Filled 2023-03-09: qty 3300

## 2023-03-09 MED ORDER — LACTATED RINGERS IV SOLN
INTRAVENOUS | Status: AC
Start: 2023-03-09 — End: 2023-03-10

## 2023-03-09 MED ORDER — TRIAZOLAM 0.125 MG PO TABS
0.2500 mg | ORAL_TABLET | Freq: Every evening | ORAL | Status: DC | PRN
  Administered 2023-03-09 – 2023-03-11 (×3): 0.25 mg via ORAL
  Filled 2023-03-09 (×3): qty 2

## 2023-03-09 MED ORDER — METOPROLOL TARTRATE 5 MG/5ML IV SOLN: 2.5000 mg | INTRAVENOUS | Status: DC | PRN

## 2023-03-09 MED ORDER — ONDANSETRON HCL 4 MG PO TABS
4.0000 mg | ORAL_TABLET | Freq: Four times a day (QID) | ORAL | Status: DC | PRN
  Filled 2023-03-09: qty 1

## 2023-03-09 MED ORDER — METOPROLOL TARTRATE 5 MG/5ML IV SOLN
INTRAVENOUS | Status: AC
  Administered 2023-03-09: 5 mg
  Filled 2023-03-09: qty 5

## 2023-03-09 MED ORDER — DILTIAZEM LOAD VIA INFUSION
15.0000 mg | Freq: Once | INTRAVENOUS | Status: AC
Start: 2023-03-09 — End: 2023-03-09
  Administered 2023-03-09: 15 mg via INTRAVENOUS
  Filled 2023-03-09: qty 15

## 2023-03-09 MED ORDER — GUAIFENESIN ER 600 MG PO TB12
600.0000 mg | ORAL_TABLET | Freq: Two times a day (BID) | ORAL | Status: DC
  Administered 2023-03-09 – 2023-03-12 (×7): 600 mg via ORAL
  Filled 2023-03-09 (×7): qty 1

## 2023-03-09 MED ORDER — DILTIAZEM HCL-DEXTROSE 125-5 MG/125ML-% IV SOLN (PREMIX)
INTRAVENOUS | Status: AC
  Filled 2023-03-09: qty 125

## 2023-03-09 MED ORDER — BENZONATATE 100 MG PO CAPS
200.0000 mg | ORAL_CAPSULE | Freq: Three times a day (TID) | ORAL | Status: DC | PRN
  Administered 2023-03-09 – 2023-03-11 (×4): 200 mg via ORAL
  Filled 2023-03-09 (×5): qty 2

## 2023-03-09 MED ORDER — LABETALOL HCL 5 MG/ML IV SOLN: 5.0000 mg | Freq: Once | INTRAVENOUS | Status: DC

## 2023-03-09 MED ORDER — SODIUM CHLORIDE 0.9 % IV SOLN
2.0000 g | INTRAVENOUS | Status: DC
  Administered 2023-03-09: 2 g via INTRAVENOUS
  Filled 2023-03-09: qty 20

## 2023-03-09 MED ORDER — GUAIFENESIN ER 600 MG PO TB12: 600.0000 mg | ORAL_TABLET | Freq: Two times a day (BID) | ORAL | Status: DC | PRN

## 2023-03-09 MED ORDER — POTASSIUM CHLORIDE 10 MEQ/100ML IV SOLN
10.0000 meq | INTRAVENOUS | Status: AC
Start: 2023-03-09 — End: 2023-03-09
  Administered 2023-03-09 (×2): 10 meq via INTRAVENOUS
  Filled 2023-03-09 (×2): qty 100

## 2023-03-09 MED ORDER — LOSARTAN POTASSIUM 50 MG PO TABS: 100.0000 mg | ORAL_TABLET | Freq: Every day | ORAL | Status: DC

## 2023-03-09 MED ORDER — SODIUM CHLORIDE 0.9 % IV SOLN
250.0000 mL | INTRAVENOUS | Status: AC | PRN
Start: 2023-03-09 — End: 2023-03-10

## 2023-03-09 NOTE — ED Notes (Signed)
ED TO INPATIENT HANDOFF REPORT  ED Nurse Name and Phone #: Grover Canavan [phone]  S Name/Age/Gender Kirt Boys 67 y.o. female Room/Bed: 001C/001C  Code Status   Code Status: Full Code  Home/SNF/Other Home Patient oriented to: self, place, time, and situation Is this baseline? Yes   Triage Complete: Triage complete  Chief Complaint SVT (supraventricular tachycardia) (HCC) [I47.10]  Triage Note Pt arrived POV caox4, ambulatory c/o bodyaches that started yesterday and vomiting that started this morning, also c/o chills, congestion, productive cough. Vomiting in triage.   Pt transferred here from [facility]. Pt reported to have runs of Vtac which initiated transfer. Pt's electrolytes replenished at [facility], no further arhythmias reported   Allergies Allergies  Allergen Reactions   Codeine Nausea And Vomiting   Molnupiravir Diarrhea    Level of Care/Admitting Diagnosis ED Disposition     ED Disposition  Admit   Condition  --   Comment  Hospital Area: [facility] [[id number]]  Level of Care: Progressive [102]  Admit to Progressive based on following criteria: Other see comments  Comments: Supraventricular tachycardia  May admit patient to [facility] or [facility] if equivalent level of care is available:: No  Covid Evaluation: Asymptomatic - no recent exposure (last 10 days) testing not required  Diagnosis: SVT (supraventricular tachycardia) [facility]) [[id number]]  Admitting Physician: Tereasa Coop [[id number]]  Attending Physician: Tereasa Coop [[id number]]  Certification:: I certify this patient will need inpatient services for at least 2 midnights  Expected Medical Readiness: 03/14/2023          B Medical/Surgery History Past Medical History:  Diagnosis Date   GERD (gastroesophageal reflux disease)    High cholesterol    Hypertension    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   Osteoporosis    T SCORE OF -2.7 ON DEXA IN 2013   Squamous cell  carcinoma of skin 01/04/2021   in situ- right post crown ([id number])   Past Surgical History:  Procedure Laterality Date   APPENDECTOMY     CHOLECYSTECTOMY     TONSILLECTOMY       A IV Location/Drains/Wounds Patient Lines/Drains/Airways Status     Active Line/Drains/Airways     Name Placement date Placement time Site Days   Peripheral IV 03/08/23 20 G Left Antecubital 03/08/23  1610  Antecubital  1   Peripheral IV 03/08/23 20 G Anterior;Right Forearm 03/08/23  1939  Forearm  1   External Urinary Catheter 03/08/23  2054  --  1            Intake/Output Last 24 hours  Intake/Output Summary (Last 24 hours) at 03/09/2023 1217 Last data filed at 03/09/2023 0533 Gross per 24 hour  Intake 2160.97 ml  Output --  Net 2160.97 ml    Labs/Imaging Results for orders placed or performed during the hospital encounter of 03/08/23 (from the past 48 hour(s))  Lipase, blood     Status: Abnormal   Collection Time: 03/08/23  4:09 PM  Result Value Ref Range   Lipase <10 (L) 11 - 51 U/L    Comment: Performed at [facility], [address], [place], [place] [address]  Comprehensive metabolic panel     Status: Abnormal   Collection Time: 03/08/23  4:09 PM  Result Value Ref Range   Sodium 132 (L) 135 - 145 mmol/L   Potassium 3.1 (L) 3.5 - 5.1 mmol/L   Chloride 98 98 - 111 mmol/L   CO2 24 22 - 32 mmol/L  Glucose, Bld 136 (H) 70 - 99 mg/dL    Comment: Glucose reference range applies only to samples taken after fasting for at least 8 hours.   BUN 6 (L) 8 - 23 mg/dL   Creatinine, Ser 4.09 0.44 - 1.00 mg/dL   Calcium 9.3 8.9 - 81.1 mg/dL   Total Protein 7.5 6.5 - 8.1 g/dL   Albumin 4.6 3.5 - 5.0 g/dL   AST 20 15 - 41 U/L   ALT 17 0 - 44 U/L   Alkaline Phosphatase 76 38 - 126 U/L   Total Bilirubin 0.8 0.3 - 1.2 mg/dL   GFR, Estimated >91 >47 mL/min    Comment: (NOTE) Calculated using the CKD-EPI Creatinine Equation (2021)    Anion gap 10 5 - 15    Comment:  Performed at [facility], [address] [place], [place] [address]  CBC     Status: Abnormal   Collection Time: 03/08/23  4:09 PM  Result Value Ref Range   WBC 12.5 (H) 4.0 - 10.5 K/uL   RBC 4.61 3.87 - 5.11 MIL/uL   Hemoglobin 12.8 12.0 - 15.0 g/dL   HCT 21.3 08.6 - 57.8 %   MCV 82.6 80.0 - 100.0 fL   MCH 27.8 26.0 - 34.0 pg   MCHC 33.6 30.0 - 36.0 g/dL   RDW 46.9 62.9 - 52.8 %   Platelets 274 150 - 400 K/uL   nRBC 0.0 0.0 - 0.2 %    Comment: Performed at [facility], [address] [place], [place] [address]  Urinalysis, Routine w reflex microscopic -Urine, Clean Catch     Status: Abnormal   Collection Time: 03/08/23  4:09 PM  Result Value Ref Range   Color, Urine YELLOW YELLOW   APPearance CLEAR CLEAR   Specific Gravity, Urine 1.018 1.005 - 1.030   pH 6.5 5.0 - 8.0   Glucose, UA NEGATIVE NEGATIVE mg/dL   Hgb urine dipstick NEGATIVE NEGATIVE   Bilirubin Urine NEGATIVE NEGATIVE   Ketones, ur 15 (A) NEGATIVE mg/dL   Protein, ur 30 (A) NEGATIVE mg/dL   Nitrite NEGATIVE NEGATIVE   Leukocytes,Ua SMALL (A) NEGATIVE   RBC / HPF 0-5 0 - 5 RBC/hpf   WBC, UA 0-5 0 - 5 WBC/hpf   Bacteria, UA NONE SEEN NONE SEEN   Squamous Epithelial / HPF 0-5 0 - 5 /HPF   Mucus PRESENT    Hyaline Casts, UA PRESENT     Comment: Performed at [facility], [address], [place], [place] [address]  SARS Coronavirus 2 by RT PCR (hospital order, performed in [facility] hospital lab) *cepheid single result test* Anterior Nasal Swab     Status: None   Collection Time: 03/08/23  4:09 PM   Specimen: Anterior Nasal Swab  Result Value Ref Range   SARS Coronavirus 2 by RT PCR NEGATIVE NEGATIVE    Comment: (NOTE) SARS-CoV-2 target nucleic acids are NOT DETECTED.  The SARS-CoV-2 RNA is generally detectable in upper and lower respiratory specimens during the acute phase of infection. The lowest concentration of SARS-CoV-2 viral copies this assay  can detect is 250 copies / mL. A negative result does not preclude SARS-CoV-2 infection and should not be used as the sole basis for treatment or other patient management decisions.  A negative result may occur with improper specimen collection / handling, submission of specimen other than nasopharyngeal swab, presence of viral mutation(s) within the areas targeted by this assay, and inadequate number of viral copies (<  250 copies / mL). A negative result must be combined with clinical observations, patient history, and epidemiological information.  Fact Sheet for Patients:   RoadLapTop.co.za  Fact Sheet for Healthcare Providers: http://kim-miller.com/  This test is not yet approved or  cleared by the [place] FDA and has been authorized for detection and/or diagnosis of SARS-CoV-2 by FDA under an Emergency Use Authorization (EUA).  This EUA will remain in effect (meaning this test can be used) for the duration of the COVID-19 declaration under Section 564(b)(1) of the Act, 21 U.S.C. section 360bbb-3(b)(1), unless the authorization is terminated or revoked sooner.  Performed at [facility], [address] [place], [place] [address]   Troponin I (High Sensitivity)     Status: None   Collection Time: 03/08/23  7:29 PM  Result Value Ref Range   Troponin I (High Sensitivity) 12 <18 ng/L    Comment: (NOTE) Elevated high sensitivity troponin I (hsTnI) values and significant  changes across serial measurements may suggest ACS but many other  chronic and acute conditions are known to elevate hsTnI results.  Refer to the "Links" section for chest pain algorithms and additional  guidance. Performed at [facility], [address], [place], [place] [address]   Brain natriuretic peptide     Status: Abnormal   Collection Time: 03/08/23  7:29 PM  Result Value Ref Range   B Natriuretic Peptide 122.1  (H) 0.0 - 100.0 pg/mL    Comment: Performed at [facility], [address] [place], [place] [address]  Magnesium     Status: Abnormal   Collection Time: 03/08/23  7:29 PM  Result Value Ref Range   Magnesium 1.6 (L) 1.7 - 2.4 mg/dL    Comment: Performed at [facility], [address], [place], [place] [address]  TSH     Status: None   Collection Time: 03/08/23  7:29 PM  Result Value Ref Range   TSH 0.458 0.350 - 4.500 uIU/mL    Comment: Performed by a 3rd Generation assay with a functional sensitivity of <=0.01 uIU/mL. Performed at [facility], [address], [place], [place] [address]   CK     Status: None   Collection Time: 03/08/23  7:29 PM  Result Value Ref Range   Total CK 59 38 - 234 U/L    Comment: Performed at [facility], [address], [place], [place] [address]  Troponin I (High Sensitivity)     Status: None   Collection Time: 03/08/23 10:52 PM  Result Value Ref Range   Troponin I (High Sensitivity) 13 <18 ng/L    Comment: (NOTE) Elevated high sensitivity troponin I (hsTnI) values and significant  changes across serial measurements may suggest ACS but many other  chronic and acute conditions are known to elevate hsTnI results.  Refer to the "Links" section for chest pain algorithms and additional  guidance. Performed at [facility], [address], [place], [place] [address]   Basic metabolic panel     Status: Abnormal   Collection Time: 03/09/23  1:45 AM  Result Value Ref Range   Sodium 135 135 - 145 mmol/L   Potassium 3.3 (L) 3.5 - 5.1 mmol/L   Chloride 102 98 - 111 mmol/L   CO2 20 (L) 22 - 32 mmol/L   Glucose, Bld 141 (H) 70 - 99 mg/dL    Comment: Glucose reference range applies only to samples taken after fasting for at least 8 hours.  BUN 6 (L) 8 - 23 mg/dL   Creatinine, Ser 1.61 0.44 - 1.00 mg/dL   Calcium 8.1 (L) 8.9 - 10.3 mg/dL   GFR, Estimated >09 >60 mL/min     Comment: (NOTE) Calculated using the CKD-EPI Creatinine Equation (2021)    Anion gap 13 5 - 15    Comment: Performed at [facility], [address] [place], [place] [address]  Magnesium     Status: None   Collection Time: 03/09/23  1:45 AM  Result Value Ref Range   Magnesium 2.4 1.7 - 2.4 mg/dL    Comment: Performed at [facility], [address], [place], [place] [address]  Lactic acid, plasma     Status: None   Collection Time: 03/09/23  1:45 AM  Result Value Ref Range   Lactic Acid, Venous 1.8 0.5 - 1.9 mmol/L    Comment: Performed at [facility], [address], [place], [place] [address]  Procalcitonin     Status: None   Collection Time: 03/09/23  1:45 AM  Result Value Ref Range   Procalcitonin <0.10 ng/mL    Comment:        Interpretation: PCT (Procalcitonin) <= 0.5 ng/mL: Systemic infection (sepsis) is not likely. Local bacterial infection is possible. (NOTE)       Sepsis PCT Algorithm           Lower Respiratory Tract                                      Infection PCT Algorithm    ----------------------------     ----------------------------         PCT < 0.25 ng/mL                PCT < 0.10 ng/mL          Strongly encourage             Strongly discourage   discontinuation of antibiotics    initiation of antibiotics    ----------------------------     -----------------------------       PCT 0.25 - 0.50 ng/mL            PCT 0.10 - 0.25 ng/mL               OR       >80% decrease in PCT            Discourage initiation of                                            antibiotics      Encourage discontinuation           of antibiotics    ----------------------------     -----------------------------         PCT >= 0.50 ng/mL              PCT 0.26 - 0.50 ng/mL               AND        <80% decrease in PCT             Encourage initiation of  antibiotics       Encourage continuation           of  antibiotics    ----------------------------     -----------------------------        PCT >= 0.50 ng/mL                  PCT > 0.50 ng/mL               AND         increase in PCT                  Strongly encourage                                      initiation of antibiotics    Strongly encourage escalation           of antibiotics                                     -----------------------------                                           PCT <= 0.25 ng/mL                                                 OR                                        > 80% decrease in PCT                                      Discontinue / Do not initiate                                             antibiotics  Performed at [facility], [address], [place], [place] [address]   Hemoglobin A1c     Status: Abnormal   Collection Time: 03/09/23  1:45 AM  Result Value Ref Range   Hgb A1c MFr Bld 5.9 (H) 4.8 - 5.6 %    Comment: (NOTE) Pre diabetes:          5.7%-6.4%  Diabetes:              >6.4%  Glycemic control for   <7.0% adults with diabetes    Mean Plasma Glucose 122.63 mg/dL    Comment: Performed at [facility], [address] [place], [place] [address]  CBC     Status: Abnormal   Collection Time: 03/09/23  1:45 AM  Result Value Ref Range   WBC 10.8 (H) 4.0 - 10.5 K/uL   RBC 4.42 3.87 - 5.11 MIL/uL   Hemoglobin 12.4 12.0 - 15.0 g/dL   HCT 10.9 32.3 - 55.7 %  MCV 83.7 80.0 - 100.0 fL   MCH 28.1 26.0 - 34.0 pg   MCHC 33.5 30.0 - 36.0 g/dL   RDW 03.4 74.2 - 59.5 %   Platelets 244 150 - 400 K/uL   nRBC 0.0 0.0 - 0.2 %    Comment: Performed at [facility], [address], [place], [place] [address]  Respiratory (~20 pathogens) panel by PCR     Status: None   Collection Time: 03/09/23  2:41 AM   Specimen: Nasopharyngeal Swab; Respiratory  Result Value Ref Range   Adenovirus NOT DETECTED NOT DETECTED   Coronavirus 229E NOT DETECTED NOT DETECTED    Comment:  (NOTE) The Coronavirus on the Respiratory Panel, DOES NOT test for the novel  Coronavirus (2019 nCoV)    Coronavirus HKU1 NOT DETECTED NOT DETECTED   Coronavirus NL63 NOT DETECTED NOT DETECTED   Coronavirus OC43 NOT DETECTED NOT DETECTED   Metapneumovirus NOT DETECTED NOT DETECTED   Rhinovirus / Enterovirus NOT DETECTED NOT DETECTED   Influenza A NOT DETECTED NOT DETECTED   Influenza B NOT DETECTED NOT DETECTED   Parainfluenza Virus 1 NOT DETECTED NOT DETECTED   Parainfluenza Virus 2 NOT DETECTED NOT DETECTED   Parainfluenza Virus 3 NOT DETECTED NOT DETECTED   Parainfluenza Virus 4 NOT DETECTED NOT DETECTED   Respiratory Syncytial Virus NOT DETECTED NOT DETECTED   Bordetella pertussis NOT DETECTED NOT DETECTED   Bordetella Parapertussis NOT DETECTED NOT DETECTED   Chlamydophila pneumoniae NOT DETECTED NOT DETECTED   Mycoplasma pneumoniae NOT DETECTED NOT DETECTED    Comment: Performed at [facility], [address] [place], [place] [address]  CBG monitoring, ED     Status: Abnormal   Collection Time: 03/09/23  2:45 AM  Result Value Ref Range   Glucose-Capillary 137 (H) 70 - 99 mg/dL    Comment: Glucose reference range applies only to samples taken after fasting for at least 8 hours.  CBG monitoring, ED     Status: Abnormal   Collection Time: 03/09/23  9:54 AM  Result Value Ref Range   Glucose-Capillary 129 (H) 70 - 99 mg/dL    Comment: Glucose reference range applies only to samples taken after fasting for at least 8 hours.  CBG monitoring, ED     Status: Abnormal   Collection Time: 03/09/23 11:46 AM  Result Value Ref Range   Glucose-Capillary 128 (H) 70 - 99 mg/dL    Comment: Glucose reference range applies only to samples taken after fasting for at least 8 hours.   DG Abd 2 Views  Result Date: 03/09/2023 CLINICAL DATA:  Nausea and vomiting EXAM: ABDOMEN - 2 VIEW COMPARISON:  None Available. FINDINGS: The bowel gas pattern is normal. There is no evidence of free air.  Cholecystectomy clips are present. Rounded calcifications in the pelvis are favored as phleboliths. The visualized lung bases are clear. No fractures are seen. IMPRESSION: Nonobstructive bowel gas pattern. Electronically Signed   By: Darliss Cheney M.D.   On: 03/09/2023 01:07   DG Chest Portable 1 View  Result Date: 03/08/2023 CLINICAL DATA:  Cough EXAM: PORTABLE CHEST 1 VIEW COMPARISON:  04/16/2022 FINDINGS: Patchy pulmonary infiltrate is seen within the right mid and lower lung zone, possibly infectious in the appropriate clinical setting. No definite pneumothorax or pleural effusion. Cardiac size within normal limits. Pulmonary vascularity is normal. No acute bone abnormality. IMPRESSION: 1. Patchy right mid and lower lung zone pulmonary infiltrate, possibly infectious in the appropriate clinical setting. Electronically Signed  By: Helyn Numbers M.D.   On: 03/08/2023 20:22    Pending Labs Unresulted Labs (From admission, onward)     Start     Ordered   03/09/23 0500  Comprehensive metabolic panel  Daily,   R      03/09/23 0048   03/09/23 0500  CBC  Daily,   R      03/09/23 0048   03/09/23 0500  Comprehensive metabolic panel  Once,   R        03/09/23 0500   03/09/23 0027  Rapid urine drug screen (hospital performed)  Add-on,   AD        03/09/23 0026   03/09/23 0002  Strep pneumoniae urinary antigen  Once,   URGENT        03/09/23 0001   03/09/23 0001  Lactic acid, plasma  (Lactic Acid)  STAT Now then every 3 hours,   R (with STAT occurrences)      03/09/23 0000   03/09/23 0001  Culture, blood (Routine X 2) w Reflex to ID Panel  BLOOD CULTURE X 2,   R (with STAT occurrences)      03/09/23 0000   03/09/23 0001  Expectorated Sputum Assessment w Gram Stain, Rflx to Resp Cult  Once,   R        03/09/23 0000   03/09/23 0001  Legionella Pneumophila Serogp 1 Ur Ag  Once,   URGENT        03/09/23 0000   Signed and Held  Comprehensive metabolic panel  Tomorrow morning,   R        Signed and  Held            Vitals/Pain Today's Vitals   03/09/23 0711 03/09/23 0940 03/09/23 1115 03/09/23 1145  BP:   129/65 124/67  Pulse:   64 77  Resp:   (!) 26 (!) 23  Temp: 99.4 F (37.4 C)     TempSrc: Oral     SpO2:   95% 95%  Weight:      Height:      PainSc:  0-No pain      Isolation Precautions Droplet precaution  Medications Medications  metoprolol tartrate (LOPRESSOR) tablet 25 mg (25 mg Oral Given 03/09/23 0940)  rosuvastatin (CRESTOR) tablet 10 mg (10 mg Oral Given 03/09/23 0940)  enoxaparin (LOVENOX) injection 40 mg (40 mg Subcutaneous Given 03/09/23 0940)  doxycycline (VIBRAMYCIN) 100 mg in sodium chloride 0.9 % 250 mL IVPB (0 mg Intravenous Stopped 03/09/23 0532)  sodium chloride flush (NS) 0.9 % injection 3 mL (3 mLs Intravenous Not Given 03/09/23 0956)  sodium chloride flush (NS) 0.9 % injection 3 mL (has no administration in time range)  0.9 %  sodium chloride infusion (has no administration in time range)  acetaminophen (TYLENOL) tablet 650 mg (650 mg Oral Given 03/09/23 0730)    Or  acetaminophen (TYLENOL) suppository 650 mg ( Rectal See Alternative 03/09/23 0730)  ondansetron (ZOFRAN) tablet 4 mg (has no administration in time range)    Or  ondansetron (ZOFRAN) injection 4 mg (has no administration in time range)  cefTRIAXone (ROCEPHIN) 2 g in sodium chloride 0.9 % 100 mL IVPB (has no administration in time range)  guaiFENesin (MUCINEX) 12 hr tablet 600 mg (has no administration in time range)  insulin aspart (novoLOG) injection 0-6 Units ( Subcutaneous Not Given 03/09/23 1147)  insulin aspart (novoLOG) injection 0-5 Units ( Subcutaneous Not Given 03/09/23 0312)  lactated ringers infusion ( Intravenous Not  Given 03/09/23 1131)  levalbuterol (XOPENEX) nebulizer solution 0.63 mg (has no administration in time range)  labetalol (NORMODYNE) injection 5 mg (has no administration in time range)  ondansetron (ZOFRAN) injection 4 mg (4 mg Intravenous Given  03/08/23 1613)  sodium chloride 0.9 % bolus 1,000 mL (0 mLs Intravenous Stopped 03/09/23 0532)  potassium chloride 10 mEq in 100 mL IVPB (0 mEq Intravenous Stopped 03/08/23 2138)  labetalol (NORMODYNE) injection 5 mg (5 mg Intravenous Given 03/08/23 1939)  acetaminophen (TYLENOL) tablet 650 mg (650 mg Oral Given 03/08/23 2020)  magnesium sulfate IVPB 2 g 50 mL (0 g Intravenous Stopped 03/08/23 2138)  cefTRIAXone (ROCEPHIN) 1 g in sodium chloride 0.9 % 100 mL IVPB (0 g Intravenous Stopped 03/08/23 2115)  azithromycin (ZITHROMAX) 500 mg in sodium chloride 0.9 % 250 mL IVPB (0 mg Intravenous Stopped 03/08/23 2215)  cefTRIAXone (ROCEPHIN) 1 g in sodium chloride 0.9 % 100 mL IVPB (0 g Intravenous Stopped 03/09/23 0341)  potassium chloride 10 mEq in 100 mL IVPB (0 mEq Intravenous Stopped 03/09/23 0533)  potassium chloride (KLOR-CON) packet 40 mEq (40 mEq Oral Given 03/09/23 0232)    Mobility walks     Focused Assessments Pulmonary Assessment Handoff:  Lung sounds: Bilateral Breath Sounds: Clear, Diminished O2 Device: Room Air      R Recommendations: See Admitting Provider Note  Report given to:   Additional Notes:

## 2023-03-09 NOTE — Progress Notes (Signed)
Still on A-fib hr-150's with Cardizem gtt at 15 ml/hr asymptomatic.

## 2023-03-09 NOTE — Progress Notes (Signed)
Heart rate went up to upper 150's a-fib- bp-151/98 asymptomatic  EKG done shows a flutter rate150. MD made aware with order.

## 2023-03-09 NOTE — Progress Notes (Signed)
PHARMACY - ANTICOAGULATION CONSULT NOTE  Pharmacy Consult for heparin gtt Indication: atrial fibrillation  Allergies  Allergen Reactions   Codeine Nausea And Vomiting   Molnupiravir Diarrhea    Patient Measurements: Height: 5\' 1"  (154.9 cm) Weight: 78 kg (171 lb 15.3 oz) IBW/kg (Calculated) : 47.8  Heparin Dosing Weight: 65.2 kg  Vital Signs: Temp: 100 F (37.8 C) (10/25 1517) Temp Source: Oral (10/25 1517) BP: 151/98 (10/25 1700) Pulse Rate: 149 (10/25 1700)  Labs: Recent Labs    03/08/23 1609 03/08/23 1929 03/08/23 2252 03/09/23 0145 03/09/23 1545  HGB 12.8  --   --  12.4  --   HCT 38.1  --   --  37.0  --   PLT 274  --   --  244  --   CREATININE 0.96  --   --  0.77 0.76  CKTOTAL  --  59  --   --   --   TROPONINIHS  --  12 13  --   --     Estimated Creatinine Clearance: 63.6 mL/min (by C-G formula based on SCr of 0.76 mg/dL).   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   Past Week   famotidine (PEPCID) 40 MG tablet Take 40 mg by mouth daily.   03/08/2023   fluticasone (FLONASE) 50 MCG/ACT nasal spray Place 2 sprays into both nostrils daily.   Past Week   furosemide (LASIX) 20 MG tablet Take 20 mg by mouth daily.   03/08/2023   hydrochlorothiazide (HYDRODIURIL) 25 MG tablet Take 25 mg by mouth daily.   03/08/2023   imiquimod (ALDARA) 5 % cream Apply 1 Application topically at bedtime. Apply to areas on scalp   Past Week   losartan (COZAAR) 100 MG tablet Take 100 mg by mouth daily.   03/08/2023    meclizine (ANTIVERT) 25 MG tablet Take 25 mg by mouth daily as needed for dizziness.   Past Week   metFORMIN (GLUCOPHAGE) 500 MG tablet Take 500 mg by mouth daily.   03/08/2023   metoprolol tartrate (LOPRESSOR) 25 MG tablet Take 0.5 tablets (12.5 mg total) by mouth 2 (two) times daily. (Patient taking differently: Take 25 mg by mouth daily.)   03/08/2023 at 0900   ondansetron (ZOFRAN) 4 MG tablet Take 4 mg by mouth 2 (two) times daily as needed for nausea.   Past Week   pantoprazole (PROTONIX) 40 MG tablet Take 40 mg by mouth daily.   03/08/2023   RINVOQ 15 MG TB24 Take 15 mg by mouth at bedtime.   03/08/2023   rosuvastatin (CRESTOR) 10 MG tablet Take 10 mg by mouth daily.   03/08/2023   triazolam (HALCION) 0.25 MG tablet Take 0.25 mg by mouth at bedtime.   Past Week   Vitamin D, Ergocalciferol, (DRISDOL) 1.25 MG (50000 UNIT) CAPS capsule Take 50,000 Units by mouth once a week. Take on Friday   Past Week   Scheduled:   diltiazem  15  mg Intravenous Once   dilTIAZem HCl-Dextrose       guaiFENesin  600 mg Oral BID   insulin aspart  0-5 Units Subcutaneous QHS   insulin aspart  0-6 Units Subcutaneous TID WC   metoprolol tartrate  5 mg Intravenous Once   rosuvastatin  10 mg Oral Daily   sodium chloride flush  3 mL Intravenous Q12H    Assessment: 67 YOF admitted with new onset A. Flutter requiring heparin gtt.   Goal of Therapy:  Heparin level 0.3-0.7 units/ml Monitor platelets by anticoagulation protocol: Yes   Plan:  Give 3300 units bolus x 1 Start heparin infusion at 900 units/hr Check anti-Xa level in 8 hours and daily while on heparin Continue to monitor H&H and platelets  Yamato Kopf BS, PharmD, BCPS Clinical Pharmacist 03/09/2023 5:38 PM  Contact: [phone] after 3 PM  "Be curious, not judgmental..." -Debbora Dus

## 2023-03-09 NOTE — Progress Notes (Signed)
Lactic acid relayed by lab is 2.0 , MD  made aware .

## 2023-03-09 NOTE — ED Notes (Signed)
Patient transported to X-ray 

## 2023-03-09 NOTE — Progress Notes (Signed)
PROGRESS NOTE    [facility]  ZHY:[id number] DOB: 1955/12/06 DOA: 03/08/2023 PCP: Jarrett Soho, PA-C   Brief Narrative: Latorra Lorman is a 67 y.o. female with a history of hypertension, hyperlipidemia, CAD, diabetes mellitus type 2, obesity.  Patient presented secondary to body aches, nausea, vomiting and productive cough and found to have evidence of right lower lobe pneumonia with associated sepsis physiology patient started empirically on antibiotics.  Hospitalization complicated by tachycardia concerning for possible SVT versus atrial fibrillation versus atrial flutter.   Assessment/Plan:  Tachycardia Possible SVT vs atrial fibrillation vs flutter. Patient initially treated in ED with labetalol IV with resolution of tachycardia. Patient now returned to fast rhythm with heart rates in 150s. EKG likely shows atrial flutter versus SVT. Consulted cardiology. Okay with metoprolol 5 mg IV x1. Will defer anticoagulation to cardiology service.  Sepsis Present on admission and secondary to pneumonia. Blood cultures obtained and are pending. Procalcitonin negative. RVP negative. COVID-19 negative. Urinalysis not suggestive of infection.  Right mid and lower lung pneumonia Noted infiltrate noted on imaging. Patient with associated fever and leukocytosis. -Continue Ceftriaxone and doxycycline -Sputum culture -Flutter valve -Mucinex and Tessalon Perles  Acute respiratory failure with hypoxia Secondary to pneumonia. -Wean to room air as able -Ambulatory pulse ox prior to discharge  Hypokalemia Potassium of 3.1 on admission. Supplementation given. Resolved.  Hypomagnesemia Magnesium of 1.6 on admission. Supplementation given. Resolved.  Nausea and vomiting Present on admission. No nausea or vomiting while admitted so far. -Continue Zofran PRN  Hyponatremia Mild. Resolved.  Primary hypertension -Continue metoprolol  CAD -Continue Lipitor  Diabetes  mellitus type 2 Controlled with hemoglobin A1C of 5.9%. patient is on metformin as an outpatient; metformin held on admission. -Continue SSI  Obesity Estimated body mass index is 32.49 kg/m as calculated from the following:   Height as of this encounter: 5\' 1"  (1.549 m).   Weight as of this encounter: 78 kg.   DVT prophylaxis: Lovenox Code Status:   Code Status: Full Code Family Communication: Daughter and grandson at bedside Disposition Plan: Discharge pending transition to outpatient antibiotics, wean to room air if possible and mobility   Consultants:  PCCM Cardiology  Procedures:  Transthoracic Echocardiogram (pending)  Antimicrobials: Ceftriaxone Doxycycline    Subjective: Patient reports ongoing cough. Otherwise, she feels better than on admission.  Objective: BP (!) 141/68   Pulse 78   Temp 99.4 F (37.4 C) (Oral)   Resp (!) 27   Ht 5\' 1"  (1.549 m)   Wt 78 kg   SpO2 93%   BMI 32.49 kg/m   Examination:  General exam: Appears calm and comfortable. Frequent cough. Respiratory system: Clear to auscultation. Respiratory effort normal. Cardiovascular system: S1 & S2 heard, RRR. Gastrointestinal system: Abdomen is nondistended, soft and nontender. Normal bowel sounds heard. Central nervous system: Alert and oriented. Musculoskeletal: No edema. No calf tenderness Psychiatry: Judgement and insight appear normal. Mood & affect appropriate.    Data Reviewed: I have personally reviewed following labs and imaging studies   Last CBC Lab Results  Component Value Date   WBC 10.8 (H) 03/09/2023   HGB 12.4 03/09/2023   HCT 37.0 03/09/2023   MCV 83.7 03/09/2023   MCH 28.1 03/09/2023   RDW 13.8 03/09/2023   PLT 244 03/09/2023     Last metabolic panel Lab Results  Component Value Date   GLUCOSE 141 (H) 03/09/2023   NA 135 03/09/2023   K 3.3 (L) 03/09/2023   CL 102 03/09/2023  CO2 20 (L) 03/09/2023   BUN 6 (L) 03/09/2023   CREATININE 0.77 03/09/2023    GFRNONAA >60 03/09/2023   CALCIUM 8.1 (L) 03/09/2023   PHOS 4.9 (H) 12/17/2008   PROT 7.5 03/08/2023   ALBUMIN 4.6 03/08/2023   LABGLOB 3.2 03/13/2017   AGRATIO 1.1 03/13/2017   BILITOT 0.8 03/08/2023   ALKPHOS 76 03/08/2023   AST 20 03/08/2023   ALT 17 03/08/2023   ANIONGAP 13 03/09/2023     Creatinine Clearance: Estimated Creatinine Clearance: 63.6 mL/min (by C-G formula based on SCr of 0.77 mg/dL).  Recent Results (from the past 240 hour(s))  SARS Coronavirus 2 by RT PCR (hospital order, performed in [facility] hospital lab) *cepheid single result test* Anterior Nasal Swab     Status: None   Collection Time: 03/08/23  4:09 PM   Specimen: Anterior Nasal Swab  Result Value Ref Range Status   SARS Coronavirus 2 by RT PCR NEGATIVE NEGATIVE Final    Comment: (NOTE) SARS-CoV-2 target nucleic acids are NOT DETECTED.  The SARS-CoV-2 RNA is generally detectable in upper and lower respiratory specimens during the acute phase of infection. The lowest concentration of SARS-CoV-2 viral copies this assay can detect is 250 copies / mL. A negative result does not preclude SARS-CoV-2 infection and should not be used as the sole basis for treatment or other patient management decisions.  A negative result may occur with improper specimen collection / handling, submission of specimen other than nasopharyngeal swab, presence of viral mutation(s) within the areas targeted by this assay, and inadequate number of viral copies (<250 copies / mL). A negative result must be combined with clinical observations, patient history, and epidemiological information.  Fact Sheet for Patients:   RoadLapTop.co.za  Fact Sheet for Healthcare Providers: http://kim-miller.com/  This test is not yet approved or  cleared by the [place] FDA and has been authorized for detection and/or diagnosis of SARS-CoV-2 by FDA under an Emergency Use Authorization (EUA).   This EUA will remain in effect (meaning this test can be used) for the duration of the COVID-19 declaration under Section 564(b)(1) of the Act, 21 U.S.C. section 360bbb-3(b)(1), unless the authorization is terminated or revoked sooner.  Performed at [facility], [address] [place], [place] [address]   Respiratory (~20 pathogens) panel by PCR     Status: None   Collection Time: 03/09/23  2:41 AM   Specimen: Nasopharyngeal Swab; Respiratory  Result Value Ref Range Status   Adenovirus NOT DETECTED NOT DETECTED Final   Coronavirus 229E NOT DETECTED NOT DETECTED Final    Comment: (NOTE) The Coronavirus on the Respiratory Panel, DOES NOT test for the novel  Coronavirus (2019 nCoV)    Coronavirus HKU1 NOT DETECTED NOT DETECTED Final   Coronavirus NL63 NOT DETECTED NOT DETECTED Final   Coronavirus OC43 NOT DETECTED NOT DETECTED Final   Metapneumovirus NOT DETECTED NOT DETECTED Final   Rhinovirus / Enterovirus NOT DETECTED NOT DETECTED Final   Influenza A NOT DETECTED NOT DETECTED Final   Influenza B NOT DETECTED NOT DETECTED Final   Parainfluenza Virus 1 NOT DETECTED NOT DETECTED Final   Parainfluenza Virus 2 NOT DETECTED NOT DETECTED Final   Parainfluenza Virus 3 NOT DETECTED NOT DETECTED Final   Parainfluenza Virus 4 NOT DETECTED NOT DETECTED Final   Respiratory Syncytial Virus NOT DETECTED NOT DETECTED Final   Bordetella pertussis NOT DETECTED NOT DETECTED Final   Bordetella Parapertussis NOT DETECTED NOT DETECTED Final   Chlamydophila pneumoniae NOT DETECTED  NOT DETECTED Final   Mycoplasma pneumoniae NOT DETECTED NOT DETECTED Final    Comment: Performed at [facility], [address] [place], [place] [address]      Radiology Studies: DG Abd 2 Views  Result Date: 03/09/2023 CLINICAL DATA:  Nausea and vomiting EXAM: ABDOMEN - 2 VIEW COMPARISON:  None Available. FINDINGS: The bowel gas pattern is normal. There is no evidence of free air.  Cholecystectomy clips are present. Rounded calcifications in the pelvis are favored as phleboliths. The visualized lung bases are clear. No fractures are seen. IMPRESSION: Nonobstructive bowel gas pattern. Electronically Signed   By: Darliss Cheney M.D.   On: 03/09/2023 01:07   DG Chest Portable 1 View  Result Date: 03/08/2023 CLINICAL DATA:  Cough EXAM: PORTABLE CHEST 1 VIEW COMPARISON:  04/16/2022 FINDINGS: Patchy pulmonary infiltrate is seen within the right mid and lower lung zone, possibly infectious in the appropriate clinical setting. No definite pneumothorax or pleural effusion. Cardiac size within normal limits. Pulmonary vascularity is normal. No acute bone abnormality. IMPRESSION: 1. Patchy right mid and lower lung zone pulmonary infiltrate, possibly infectious in the appropriate clinical setting. Electronically Signed   By: Helyn Numbers M.D.   On: 03/08/2023 20:22      LOS: 0 days    Jacquelin Hawking, MD [facility] 03/09/2023, 10:28 AM   If 7PM-7AM, please contact night-coverage [email or web address]

## 2023-03-09 NOTE — Progress Notes (Signed)
Lopressor 5 mg iv given, seen by cardiologist,  with no change with heart  rate still on 150's a-flutter.  Started on cardizem gtt with bolus 15 mg and started  at 10 mg/hr. No changes with heart rate   gtt increased to 15mg  /hr.  Continue to monitor.

## 2023-03-09 NOTE — Consult Note (Signed)
Cardiology Consultation:  Patient ID: [name] MRN: [id number]; DOB: 06-09-1955  Admit date: 03/08/2023 Date of Consult: 03/09/2023  Primary Care Provider: Jarrett Soho, PA-C Primary Cardiologist: None  Primary Electrophysiologist:  None   Patient Profile:  [name] is a 67 y.o. female with a hx of HTN, HLD who is being seen today for the evaluation of atrial flutter at the request of [name], MD.  History of Present Illness:  Ms. Data presents with 2 to 3 days of shortness of breath, nausea vomiting and bodyaches.  She also reports productive cough.  She is currently admitted to [facility] with sepsis secondary to pneumonia.  Being treated for a right middle lobe pneumonia.  While in the emergency room she was noted to have SVT.  She was given labetalol with improvement.  Some of her EKGs look like sinus rhythm with brief atrial tachycardia.  Her EKG on 03/08/2023 at 1911 likely is atrial fibrillation.  She did go back into rhythm.  This afternoon notified by hospital medicine of return of arrhythmia.  Appears to be atrial flutter with 2 1 block.  She denies any history of arrhythmia before.  Currently admitted with pneumonia and nausea vomiting.  Vitals are stable.  Heart rate 149.  BP 151/98.  Labs notable for serum creatinine 0.76.  Lactic acid 2.0.  She does have an elevated white count at 10.8.  Procalcitonin is negative.  BMP was checked with value 122.  Troponin 12 and 13 on repeat.  TSH is 0.458.  She seems to be unaware of her arrhythmia.  She still reports cough and congestion which is similar to what brought her in the hospital.  Echo on my review shows LVEF 60-65%.  Formal read is pending.  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 Authorizing Provider  acetaminophen (TYLENOL) 500 MG tablet Take 1,000 mg by mouth 2 (two) times daily as needed for moderate pain.   Yes [provider]  famotidine (PEPCID) 40 MG tablet Take 40 mg by mouth daily.   Yes [provider]  fluticasone (FLONASE) 50 MCG/ACT nasal spray Place 2 sprays into both nostrils daily.   Yes [provider]  furosemide (LASIX) 20 MG tablet Take 20 mg by mouth daily. 01/15/23  Yes [provider]  hydrochlorothiazide (HYDRODIURIL) 25 MG tablet Take 25 mg by mouth daily.   Yes [provider]  imiquimod (ALDARA) 5 % cream Apply 1 Application topically at bedtime. Apply to areas on scalp 12/27/22  Yes [provider]  losartan (COZAAR) 100 MG tablet Take 100 mg by mouth daily.   Yes [provider]  meclizine (ANTIVERT) 25 MG tablet Take 25 mg by mouth daily as needed for dizziness.   Yes [provider]  metFORMIN (GLUCOPHAGE) 500 MG tablet Take 500 mg  by mouth daily.   Yes [provider]  metoprolol tartrate (LOPRESSOR) 25 MG tablet Take 0.5 tablets (12.5 mg total) by mouth 2 (two) times daily. Patient taking differently: Take 25 mg by mouth daily. 04/20/16  Yes End, Cristal Deer, MD  ondansetron (ZOFRAN) 4 MG tablet Take 4 mg by mouth 2 (two) times daily as needed for nausea. 08/17/22  Yes [provider]  pantoprazole (PROTONIX) 40 MG tablet Take 40 mg by mouth daily.   Yes [provider]  RINVOQ 15 MG TB24 Take 15 mg by mouth at bedtime. 03/22/22  Yes [provider]  rosuvastatin (CRESTOR) 10 MG tablet Take 10 mg by mouth daily.    Yes [provider]  triazolam (HALCION) 0.25 MG tablet Take 0.25 mg by mouth at bedtime.   Yes [provider]  Vitamin D, Ergocalciferol, (DRISDOL) 1.25 MG (50000 UNIT) CAPS capsule Take 50,000 Units by mouth once a week. Take on Friday 02/07/23  Yes [provider]    Inpatient Medications: Scheduled Meds:  metoprolol tartrate       enoxaparin (LOVENOX) injection  40 mg Subcutaneous Q24H   guaiFENesin  600 mg Oral BID   insulin aspart  0-5 Units Subcutaneous QHS   insulin aspart  0-6 Units Subcutaneous TID WC   metoprolol tartrate  5 mg Intravenous Once   metoprolol tartrate  25 mg Oral Daily   rosuvastatin  10 mg Oral Daily   sodium chloride flush  3 mL Intravenous Q12H   Continuous Infusions:  sodium chloride     cefTRIAXone (ROCEPHIN)  IV     doxycycline (VIBRAMYCIN) IV 100 mg (03/09/23 1305)   lactated ringers     PRN Meds: metoprolol tartrate, sodium chloride, acetaminophen **OR** acetaminophen, benzonatate, labetalol, levalbuterol, ondansetron **OR** ondansetron (ZOFRAN) IV, sodium chloride flush, triazolam  Allergies:    Allergies  Allergen Reactions   Codeine Nausea And Vomiting   Molnupiravir Diarrhea    Social History:   Social History   Socioeconomic History   Marital status: Married    Spouse name: Not on file   Number of children: Not on file   Years of education: Not on file   Highest education level: Not on file  Occupational History   Occupation: UNEMPLOYED  Tobacco Use   Smoking status: Former    Current packs/day: 0.00    Average packs/day: 2.5 packs/day for 35.0 years (87.5 ttl pk-yrs)    Types: Cigarettes    Start date: 12/24/1973    Quit date: 12/24/2008    Years since quitting: 14.2   Smokeless tobacco: Never   Tobacco comments:    Patient quit smoking in 2012.  Vaping Use   Vaping status: Never Used  Substance and Sexual Activity   Alcohol use: No   Drug use: No   Sexual activity: Not on file  Other  Topics Concern   Not on file  Social History Narrative   Patient lives at home with her husband.  They do have a cat.  She denies any recent travel.  No mold exposure.  She reports she previously worked for [employer].   Social Determinants of Health   Financial Resource Strain: Not on file  Food Insecurity: No Food Insecurity (03/09/2023)   Hunger Vital Sign    Worried About Running Out of Food in the Last Year: Never true    Ran Out of Food in the Last Year: Never true  Transportation Needs: No Transportation Needs (03/09/2023)   PRAPARE -  Administrator, Civil Service (Medical): No    Lack of Transportation (Non-Medical): No  Physical Activity: Not on file  Stress: Not on file  Social Connections: Not on file  Intimate Partner Violence: Not At Risk (03/09/2023)   Humiliation, Afraid, Rape, and Kick questionnaire    Fear of Current or Ex-Partner: No    Emotionally Abused: No    Physically Abused: No    Sexually Abused: No     Family History:    Family History  Problem Relation Age of Onset   Colon polyps Mother    Hypertension Mother    Kidney disease Mother    Microcephaly Father    CAD Father    Heart disease Father    Heart disease Sister        atrial fibrillation   Atrial fibrillation Sister    Atrial fibrillation Brother    Heart attack Brother        died 90   Colon cancer Neg Hx    Liver disease Neg Hx      ROS:  All other ROS reviewed and negative. Pertinent positives noted in the HPI.     Physical Exam/Data:   Vitals:   03/09/23 1245 03/09/23 1306 03/09/23 1517 03/09/23 1700  BP: 136/64   (!) 151/98  Pulse: 71   (!) 149  Resp: (!) 29     Temp:  98.3 F (36.8 C) 100 F (37.8 C)   TempSrc:  Oral Oral   SpO2: 96%   92%  Weight:      Height:        Intake/Output Summary (Last 24 hours) at 03/09/2023 1725 Last data filed at 03/09/2023 1640 Gross per 24 hour  Intake 2400.97 ml  Output 300 ml  Net 2100.97 ml        03/09/2023   12:20 AM 08/17/2022    1:56 PM 04/16/2022    5:00 AM  Last 3 Weights  Weight (lbs) 171 lb 15.3 oz 172 lb 162 lb 7.7 oz  Weight (kg) 78 kg 78.019 kg 73.7 kg    Body mass index is 32.49 kg/m.  General: Well nourished, well developed, in no acute distress Head: Atraumatic, normal size  Eyes: PEERLA, EOMI  Neck: Supple, no JVD Endocrine: No thryomegaly Cardiac: Normal S1, S2; tachycardia, regular rhythm, no murmurs Lungs: Clear to auscultation bilaterally, no wheezing, rhonchi or rales  Abd: Soft, nontender, no hepatomegaly  Ext: No edema, pulses 2+ Musculoskeletal: No deformities, BUE and BLE strength normal and equal Skin: Warm and dry, no rashes   Neuro: Alert and oriented to person, place, time, and situation, CNII-XII grossly intact, no focal deficits  Psych: Normal mood and affect   EKG:  The EKG was personally reviewed and demonstrates: Atrial flutter heart rate 151, 2 1 block Telemetry:  Telemetry was personally reviewed and demonstrates: Atrial flutter 140s  Relevant CV Studies: Echo pending  Laboratory Data: High Sensitivity Troponin:   Recent Labs  Lab 03/08/23 1929 03/08/23 2252  TROPONINIHS 12 13     Cardiac EnzymesNo results for input(s): "TROPONINI" in the last 168 hours. No results for input(s): "TROPIPOC" in the last 168 hours.  Chemistry Recent Labs  Lab 03/08/23 1609 03/09/23 0145 03/09/23 1545  NA 132* 135 135  K 3.1* 3.3* 4.0  CL 98 102 102  CO2 24 20* 21*  GLUCOSE 136* 141* 117*  BUN 6* 6* 6*  CREATININE 0.96 0.77 0.76  CALCIUM 9.3 8.1* 8.2*  GFRNONAA >60 >  60 >60  ANIONGAP 10 13 12     Recent Labs  Lab 03/08/23 1609 03/09/23 1545  PROT 7.5 7.0  ALBUMIN 4.6 3.6  AST 20 30  ALT 17 22  ALKPHOS 76 62  BILITOT 0.8 0.5   Hematology Recent Labs  Lab 03/08/23 1609 03/09/23 0145  WBC 12.5* 10.8*  RBC 4.61 4.42  HGB 12.8 12.4  HCT 38.1 37.0  MCV 82.6 83.7  MCH 27.8 28.1  MCHC 33.6 33.5  RDW 13.7 13.8  PLT 274 244    BNP Recent Labs  Lab 03/08/23 1929  BNP 122.1*    DDimer No results for input(s): "DDIMER" in the last 168 hours.  Radiology/Studies:  DG Abd 2 Views  Result Date: 03/09/2023 CLINICAL DATA:  Nausea and vomiting EXAM: ABDOMEN - 2 VIEW COMPARISON:  None Available. FINDINGS: The bowel gas pattern is normal. There is no evidence of free air. Cholecystectomy clips are present. Rounded calcifications in the pelvis are favored as phleboliths. The visualized lung bases are clear. No fractures are seen. IMPRESSION: Nonobstructive bowel gas pattern. Electronically Signed   By: Darliss Cheney M.D.   On: 03/09/2023 01:07   DG Chest Portable 1 View  Result Date: 03/08/2023 CLINICAL DATA:  Cough EXAM: PORTABLE CHEST 1 VIEW COMPARISON:  04/16/2022 FINDINGS: Patchy pulmonary infiltrate is seen within the right mid and lower lung zone, possibly infectious in the appropriate clinical setting. No definite pneumothorax or pleural effusion. Cardiac size within normal limits. Pulmonary vascularity is normal. No acute bone abnormality. IMPRESSION: 1. Patchy right mid and lower lung zone pulmonary infiltrate, possibly infectious in the appropriate clinical setting. Electronically Signed   By: Helyn Numbers M.D.   On: 03/08/2023 20:22    Assessment and Plan:   # New onset atrial flutter with 2 1 block -Thyroid is normal.  Formal echo is pending but normal on my review.  With pneumonia.  Suspect this was the trigger for atrial flutter. -CHA2DS2-VASc equals 3 (age, hypertension, female).  We will start her on a heparin drip.  Would like to make sure she does not need any procedures for restart Eliquis.  We will see how she does overnight.  Likely transition to Eliquis tomorrow. -She was given 5 of IV metoprolol with no improvement.  We will start her on a Cardizem drip.  She seems to respond well to AV nodal agents.  Will see how she does with this.  If she does not convert on diltiazem could consider amiodarone.   Her duration is less than 48 hours. -Cardiology to follow along.  Will see how she does.  # PNA # Nausea and vomiting -Per hospital team.  Suspect her acute illness is driving this.  For questions or updates, please contact [facility] Please consult [email or web address] for contact info under   Signed, Gerri Spore T. Flora Lipps, MD, [facility]  [facility]  03/09/2023 5:25 PM

## 2023-03-09 NOTE — Progress Notes (Signed)
MEWS Progress Note  Patient Details Name: [name] MRN: [id number] DOB: 08/26/1955 Today's Date: 03/09/2023   MEWS Flowsheet Documentation:  Assess: MEWS Score Temp: 100 F (37.8 C) BP: (!) 151/98 MAP (mmHg): 106 Pulse Rate: (!) 149 ECG Heart Rate: (!) 147 Resp: (!) 29 Level of Consciousness: Alert SpO2: 92 % O2 Device: Room Air Assess: MEWS Score MEWS Temp: 0 MEWS Systolic: 0 MEWS Pulse: 3 MEWS RR: 2 MEWS LOC: 0 MEWS Score: 5 MEWS Score Color: Red Assess: SIRS CRITERIA SIRS Temperature : 0 SIRS Respirations : 1 SIRS Pulse: 1 SIRS WBC: 0 SIRS Score Sum : 2 SIRS Temperature : 0 SIRS Pulse: 1 SIRS Respirations : 1 SIRS WBC: 0 SIRS Score Sum : 2 Assess: if the MEWS score is Yellow or Red Were vital signs accurate and taken at a resting state?: Yes Does the patient meet 2 or more of the SIRS criteria?: Yes Does the patient have a confirmed or suspected source of infection?: No MEWS guidelines implemented : Yes, red Treat MEWS Interventions: Considered administering scheduled or prn medications/treatments as ordered Take Vital Signs Increase Vital Sign Frequency : Red: Q1hr x2, continue Q4hrs until patient remains green for 12hrs Escalate MEWS: Escalate: Red: Discuss with charge nurse and notify provider. Consider notifying RRT. If remains red for 2 hours consider need for higher level of care Notify: Charge Nurse/RN Name of Charge Nurse/RN Notified: christina Rn Provider Notification Provider Name/Title: dr Caleb Popp Date Provider Notified: 03/09/23 Time Provider Notified: 1712 Method of Notification:  (secure chat) Notification Reason: New onset of dysrhythmia Provider response: See new orders Date of Provider Response: 03/09/23 Time of Provider Response: 1712      Arvella Merles I 03/09/2023, 5:13 PM

## 2023-03-10 DIAGNOSIS — I4892 Unspecified atrial flutter: Secondary | ICD-10-CM

## 2023-03-10 DIAGNOSIS — J9601 Acute respiratory failure with hypoxia: Secondary | ICD-10-CM | POA: Diagnosis not present

## 2023-03-10 DIAGNOSIS — I483 Typical atrial flutter: Secondary | ICD-10-CM | POA: Diagnosis not present

## 2023-03-10 DIAGNOSIS — J189 Pneumonia, unspecified organism: Secondary | ICD-10-CM | POA: Diagnosis not present

## 2023-03-10 DIAGNOSIS — I10 Essential (primary) hypertension: Secondary | ICD-10-CM | POA: Diagnosis not present

## 2023-03-10 LAB — COMPREHENSIVE METABOLIC PANEL
ALT: 20 U/L (ref 0–44)
ALT: 23 U/L (ref 0–44)
AST: 26 U/L (ref 15–41)
AST: 28 U/L (ref 15–41)
Albumin: 3 g/dL — ABNORMAL LOW (ref 3.5–5.0)
Albumin: 3.4 g/dL — ABNORMAL LOW (ref 3.5–5.0)
Alkaline Phosphatase: 59 U/L (ref 38–126)
Alkaline Phosphatase: 69 U/L (ref 38–126)
Anion gap: 10 (ref 5–15)
Anion gap: 11 (ref 5–15)
BUN: 6 mg/dL — ABNORMAL LOW (ref 8–23)
BUN: 6 mg/dL — ABNORMAL LOW (ref 8–23)
CO2: 21 mmol/L — ABNORMAL LOW (ref 22–32)
CO2: 21 mmol/L — ABNORMAL LOW (ref 22–32)
Calcium: 7.6 mg/dL — ABNORMAL LOW (ref 8.9–10.3)
Calcium: 7.9 mg/dL — ABNORMAL LOW (ref 8.9–10.3)
Chloride: 100 mmol/L (ref 98–111)
Chloride: 99 mmol/L (ref 98–111)
Creatinine, Ser: 0.76 mg/dL (ref 0.44–1.00)
Creatinine, Ser: 0.82 mg/dL (ref 0.44–1.00)
GFR, Estimated: >60 mL/min (ref >60)
GFR, Estimated: >60 mL/min (ref >60)
Glucose, Bld: 132 mg/dL — ABNORMAL HIGH (ref 70–99)
Glucose, Bld: 149 mg/dL — ABNORMAL HIGH (ref 70–99)
Potassium: 3.3 mmol/L — ABNORMAL LOW (ref 3.5–5.1)
Potassium: 3.4 mmol/L — ABNORMAL LOW (ref 3.5–5.1)
Sodium: 131 mmol/L — ABNORMAL LOW (ref 135–145)
Sodium: 131 mmol/L — ABNORMAL LOW (ref 135–145)
Total Bilirubin: 0.5 mg/dL (ref 0.3–1.2)
Total Bilirubin: 0.6 mg/dL (ref 0.3–1.2)
Total Protein: 6 g/dL — ABNORMAL LOW (ref 6.5–8.1)
Total Protein: 6.7 g/dL (ref 6.5–8.1)

## 2023-03-10 LAB — CBC
HCT: 36.6 % (ref 36.0–46.0)
Hemoglobin: 12.4 g/dL (ref 12.0–15.0)
MCH: 28.2 pg (ref 26.0–34.0)
MCHC: 33.9 g/dL (ref 30.0–36.0)
MCV: 83.2 fL (ref 80.0–100.0)
Platelets: 203 10*3/uL (ref 150–400)
RBC: 4.4 MIL/uL (ref 3.87–5.11)
RDW: 13.8 % (ref 11.5–15.5)
WBC: 10.5 10*3/uL (ref 4.0–10.5)
nRBC: 0 % (ref 0.0–0.2)

## 2023-03-10 LAB — GLUCOSE, CAPILLARY
Glucose-Capillary: 109 mg/dL — ABNORMAL HIGH (ref 70–99)
Glucose-Capillary: 102 mg/dL — ABNORMAL HIGH (ref 70–99)
Glucose-Capillary: 108 mg/dL — ABNORMAL HIGH (ref 70–99)
Glucose-Capillary: 158 mg/dL — ABNORMAL HIGH (ref 70–99)

## 2023-03-10 LAB — EXPECTORATED SPUTUM ASSESSMENT W GRAM STAIN, RFLX TO RESP C

## 2023-03-10 LAB — HEPARIN LEVEL (UNFRACTIONATED): Heparin Unfractionated: 0.74 [IU]/mL — ABNORMAL HIGH (ref 0.30–0.70)

## 2023-03-10 MED ORDER — APIXABAN 5 MG PO TABS
5.0000 mg | ORAL_TABLET | Freq: Two times a day (BID) | ORAL | Status: DC
  Administered 2023-03-10 – 2023-03-12 (×5): 5 mg via ORAL
  Filled 2023-03-10 (×5): qty 1

## 2023-03-10 MED ORDER — DOXYCYCLINE HYCLATE 100 MG PO TABS
100.0000 mg | ORAL_TABLET | Freq: Two times a day (BID) | ORAL | Status: DC
  Administered 2023-03-10 – 2023-03-12 (×4): 100 mg via ORAL
  Filled 2023-03-10 (×4): qty 1

## 2023-03-10 MED ORDER — AMIODARONE HCL IN DEXTROSE 360-4.14 MG/200ML-% IV SOLN
60.0000 mg/h | INTRAVENOUS | Status: DC
  Administered 2023-03-10: 60 mg/h via INTRAVENOUS
  Filled 2023-03-10 (×2): qty 200

## 2023-03-10 MED ORDER — DILTIAZEM HCL ER COATED BEADS 120 MG PO CP24
240.0000 mg | ORAL_CAPSULE | Freq: Every day | ORAL | Status: DC
  Administered 2023-03-10 – 2023-03-11 (×2): 240 mg via ORAL
  Filled 2023-03-10 (×2): qty 2

## 2023-03-10 MED ORDER — CEFDINIR 300 MG PO CAPS
600.0000 mg | ORAL_CAPSULE | Freq: Every day | ORAL | Status: DC
  Administered 2023-03-10 – 2023-03-12 (×3): 600 mg via ORAL
  Filled 2023-03-10 (×3): qty 2

## 2023-03-10 MED ORDER — AMIODARONE LOAD VIA INFUSION
150.0000 mg | Freq: Once | INTRAVENOUS | Status: AC
  Administered 2023-03-10: 150 mg via INTRAVENOUS
  Filled 2023-03-10: qty 83.34

## 2023-03-10 MED ORDER — POTASSIUM CHLORIDE CRYS ER 20 MEQ PO TBCR
40.0000 meq | EXTENDED_RELEASE_TABLET | Freq: Once | ORAL | Status: AC
  Administered 2023-03-10: 40 meq via ORAL
  Filled 2023-03-10: qty 2

## 2023-03-10 MED ORDER — AMIODARONE HCL IN DEXTROSE 360-4.14 MG/200ML-% IV SOLN
30.0000 mg/h | INTRAVENOUS | Status: DC
  Administered 2023-03-10 – 2023-03-11 (×2): 30 mg/h via INTRAVENOUS
  Filled 2023-03-10 (×3): qty 200

## 2023-03-10 NOTE — Evaluation (Signed)
Physical Therapy Evaluation Patient Details Name: [name] MRN: [id number] DOB: 1956-04-17 Today's Date: 03/10/2023  History of Present Illness  Pt is a 67 y.o. female presenting 10/24 with body aches, emesis, productive cough, chills. Found to have  right lower lobe pneumonia with associated sepsis physiology and SVT episodes, depleted electrolytes.  PMH significant for HTN, HLD, DMII.  Clinical Impression  Pt admitted with/for s/s that confirmed PNA in addition to SVT.  Pt still not at baseline, is fatigued and sore from coughing  Pt needing supervision for gait, but otherwise is mod I for most mobility.  Pt currently limited functionally due to the problems listed below.  (see problems list.)  Pt will benefit from PT to maximize function and safety to be able to get home safely with available assist .         If plan is discharge home, recommend the following:     Can travel by private vehicle        Equipment Recommendations None recommended by PT  Recommendations for Other Services       Functional Status Assessment Patient has had a recent decline in their functional status and demonstrates the ability to make significant improvements in function in a reasonable and predictable amount of time.     Precautions / Restrictions Precautions Precautions: Fall Restrictions Weight Bearing Restrictions: No      Mobility  Bed Mobility Overal bed mobility: Modified Independent             General bed mobility comments: increased time    Transfers Overall transfer level: Needs assistance Equipment used: None Transfers: Sit to/from Stand Sit to Stand: Supervision           General transfer comment: slow, steady rise    Ambulation/Gait Ambulation/Gait assistance: Supervision Gait Distance (Feet): 230 Feet Assistive device: None Gait Pattern/deviations: Step-through pattern   Gait velocity interpretation: <1.8 ft/sec, indicate of risk for recurrent  falls   General Gait Details: mild instability, slower speed, deferring attempts to increase speed.  cautious with changing directions/abruptly.  Some fatigue but no overt SOB.  VSS on RA  Stairs            Wheelchair Mobility     Tilt Bed    Modified Rankin (Stroke Patients Only)       Balance Overall balance assessment: Needs assistance Sitting-balance support: Feet supported, No upper extremity supported Sitting balance-Leahy Scale: Good     Standing balance support: No upper extremity supported, During functional activity Standing balance-Leahy Scale: Good                               Pertinent Vitals/Pain Pain Assessment Pain Assessment: Faces Faces Pain Scale: Hurts little more Pain Location: back, abdomen Pain Descriptors / Indicators: Aching Pain Intervention(s): Monitored during session    Home Living Family/patient expects to be discharged to:: Private residence Living Arrangements: Spouse/significant other;Other relatives (grandson) Available Help at Discharge: Family;Available 24 hours/day Type of Home: House Home Access: Stairs to enter Entrance Stairs-Rails: Doctor, general practice of Steps: 4   Home Layout: One level Home Equipment: None      Prior Function Prior Level of Function : Independent/Modified Independent;Driving;Working/employed             Mobility Comments: no AD use ADLs Comments: indin ADL and IADL     Extremity/Trunk Assessment   Upper Extremity Assessment Upper Extremity Assessment: Overall WFL for tasks  assessed    Lower Extremity Assessment Lower Extremity Assessment: Overall WFL for tasks assessed (mild general weakness)    Cervical / Trunk Assessment Cervical / Trunk Assessment: Normal  Communication   Communication Communication: No apparent difficulties  Cognition Arousal: Alert Behavior During Therapy: WFL for tasks assessed/performed Overall Cognitive Status: Within  Functional Limits for tasks assessed                                          General Comments General comments (skin integrity, edema, etc.): HR 75-90    Exercises     Assessment/Plan    PT Assessment Patient needs continued PT services  PT Problem List Decreased strength;Decreased activity tolerance;Decreased mobility       PT Treatment Interventions Gait training;Functional mobility training;Therapeutic activities;Balance training;Patient/family education    PT Goals (Current goals can be found in the Care Plan section)  Acute Rehab PT Goals Patient Stated Goal: home independent PT Goal Formulation: With patient Time For Goal Achievement: 03/17/23 Potential to Achieve Goals: Good    Frequency Min 1X/week     Co-evaluation               AM-PAC PT "6 Clicks" Mobility  Outcome Measure Help needed turning from your back to your side while in a flat bed without using bedrails?: None Help needed moving from lying on your back to sitting on the side of a flat bed without using bedrails?: None Help needed moving to and from a bed to a chair (including a wheelchair)?: None Help needed standing up from a chair using your arms (e.g., wheelchair or bedside chair)?: None Help needed to walk in hospital room?: A Little Help needed climbing 3-5 steps with a railing? : A Little 6 Click Score: 22    End of Session   Activity Tolerance: Patient tolerated treatment well Patient left: in bed;with call bell/phone within reach;with family/visitor present Nurse Communication: Mobility status PT Visit Diagnosis: Unsteadiness on feet (R26.81);Other abnormalities of gait and mobility (R26.89)    Time: 4098-1191 PT Time Calculation (min) (ACUTE ONLY): 17 min   Charges:   PT Evaluation $PT Eval Low Complexity: 1 Low   PT General Charges $$ ACUTE PT VISIT: 1 Visit         03/10/2023  Jacinto Halim., PT Acute Rehabilitation Services [phone]   (office)  Eliseo Gum Khari Lett 03/10/2023, 4:22 PM

## 2023-03-10 NOTE — Plan of Care (Signed)

## 2023-03-10 NOTE — Evaluation (Signed)
Occupational Therapy Evaluation Patient Details Name: [name] MRN: [id number] DOB: [birth year] Today's Date: 03/10/2023   History of Present Illness Pt is a 67 y.o. female presenting 10/24 with body aches, emesis, productive cough, chills. Found to have  right lower lobe pneumonia with associated sepsis physiology and SVT episodes, depleted electrolytes.  PMH significant for HTN, HLD, DMII.   Clinical Impression   PTA, pt lived with husband and grandson; pt independent at baseline. Upon eval, pt with general malaise, decreased balance, strength, power, and activity tolerance. Overall CGA for OOB ADL and mobility. Tolerating ambulatory transfer without DME to restroom and one standing grooming task this session. Myrla Halsted, OTD, OTR/L [facility] Acute Rehabilitation Office: [phone] Pt agreeable to being followed acutely, but does not wish for [facility] services after discharge. VSS during session.        If plan is discharge home, recommend the following: A little help with walking and/or transfers;A little help with bathing/dressing/bathroom;Assistance with cooking/housework;Help with stairs or ramp for entrance    Functional Status Assessment  Patient has had a recent decline in their functional status and demonstrates the ability to make significant improvements in function in a reasonable and predictable amount of time.  Equipment Recommendations  Tub/shower seat    Recommendations for Other Services       Precautions / Restrictions Precautions Precautions: Fall Restrictions Weight Bearing Restrictions: No      Mobility Bed Mobility Overal bed mobility: Modified Independent             General bed mobility comments: increased time    Transfers Overall transfer level: Needs assistance Equipment used: None Transfers: Sit to/from Stand Sit to Stand: Supervision           General transfer comment: slow, steady rise      Balance                                            ADL either performed or assessed with clinical judgement   ADL Overall ADL's : Needs assistance/impaired Eating/Feeding: Independent   Grooming: Wash/dry hands;Supervision/safety;Standing   Upper Body Bathing: Set up;Sitting   Lower Body Bathing: Contact guard assist;Sit to/from stand   Upper Body Dressing : Set up;Sitting   Lower Body Dressing: Contact guard assist;Sit to/from stand   Toilet Transfer: Contact guard assist;Ambulation           Functional mobility during ADLs: Contact guard assist General ADL Comments: decreased activity tolerance and general malaise, but guarding only when OOB; no physical asisst needed     Vision Baseline Vision/History: 0 No visual deficits Ability to See in Adequate Light: 0 Adequate Patient Visual Report: No change from baseline Vision Assessment?: No apparent visual deficits     Perception Perception: Within Functional Limits       Praxis Praxis: WFL       Pertinent Vitals/Pain Pain Assessment Pain Assessment: Faces Faces Pain Scale: Hurts little more Pain Location: back, abdomen Pain Descriptors / Indicators: Aching Pain Intervention(s): Limited activity within patient's tolerance, Monitored during session     Extremity/Trunk Assessment Upper Extremity Assessment Upper Extremity Assessment: Overall WFL for tasks assessed;Right hand dominant   Lower Extremity Assessment Lower Extremity Assessment: Defer to PT evaluation       Communication Communication Communication: No apparent difficulties   Cognition Arousal: Alert Behavior During Therapy: [facility] for tasks assessed/performed Overall Cognitive  Status: Within Functional Limits for tasks assessed                                       General Comments  HR 75-90    Exercises     Shoulder Instructions      Home Living Family/patient expects to be discharged to:: Private residence Living Arrangements:  Spouse/significant other;Other relatives (grandson) Available Help at Discharge: Family;Available 24 hours/day Type of Home: House Home Access: Stairs to enter [employer] of Steps: 4 Entrance Stairs-Rails: Right;Left Home Layout: One level     Bathroom Shower/Tub: Producer, television/film/video: Handicapped height     Home Equipment: None          Prior Functioning/Environment Prior Level of Function : Independent/Modified Independent;Driving;Working/employed             Mobility Comments: no AD use ADLs Comments: indin ADL and IADL        OT Problem List: Decreased strength;Decreased activity tolerance;Impaired balance (sitting and/or standing);Decreased safety awareness      OT Treatment/Interventions: Self-care/ADL training;Therapeutic exercise;DME and/or AE instruction;Energy conservation;Balance training;Patient/family education;Therapeutic activities    OT Goals(Current goals can be found in the care plan section) Acute Rehab OT Goals Patient Stated Goal: go home/get better OT Goal Formulation: With patient Time For Goal Achievement: 03/24/23 Potential to Achieve Goals: Good  OT Frequency: Min 1X/week    Co-evaluation              AM-PAC OT "6 Clicks" Daily Activity     Outcome Measure Help from another person eating meals?: None Help from another person taking care of personal grooming?: A Little Help from another person toileting, which includes using toliet, bedpan, or urinal?: A Little Help from another person bathing (including washing, rinsing, drying)?: A Little Help from another person to put on and taking off regular upper body clothing?: A Little Help from another person to put on and taking off regular lower body clothing?: A Little 6 Click Score: 19   End of Session Equipment Utilized During Treatment: Gait belt Nurse Communication: Mobility status (decr activity tolerance.)  Activity Tolerance: Patient tolerated  treatment well Patient left: in bed;with call bell/phone within reach  OT Visit Diagnosis: Unsteadiness on feet (R26.81);Muscle weakness (generalized) (M62.81);Other (comment) (decr activity tolerance)                Time: 1000-1030 OT Time Calculation (min): 30 min Charges:  OT General Charges $OT Visit: 1 Visit OT Evaluation $OT Eval Moderate Complexity: 1 Mod  Tyler Deis, OTR/L [facility] Acute Rehabilitation Office: [phone]   Myrla Halsted 03/10/2023, 1:16 PM

## 2023-03-10 NOTE — Discharge Instructions (Signed)
Information on my medicine - ELIQUIS (apixaban)  Why was Eliquis prescribed for you? Eliquis was prescribed for you to reduce the risk of a blood clot forming that can cause a stroke if you have a medical condition called atrial fibrillation (a type of irregular heartbeat).  What do You need to know about Eliquis ? Take your Eliquis TWICE DAILY - one tablet in the morning and one tablet in the evening with or without food. If you have difficulty swallowing the tablet whole please discuss with your pharmacist how to take the medication safely.  Take Eliquis exactly as prescribed by your doctor and DO NOT stop taking Eliquis without talking to the doctor who prescribed the medication.  Stopping may increase your risk of developing a stroke.  Refill your prescription before you run out.  After discharge, you should have regular check-up appointments with your healthcare provider that is prescribing your Eliquis.  In the future your dose may need to be changed if your kidney function or weight changes by a significant amount or as you get older.  What do you do if you miss a dose? If you miss a dose, take it as soon as you remember on the same day and resume taking twice daily.  Do not take more than one dose of ELIQUIS at the same time to make up a missed dose.  Important Safety Information A possible side effect of Eliquis is bleeding. You should call your healthcare provider right away if you experience any of the following: Bleeding from an injury or your nose that does not stop. Unusual colored urine (red or dark brown) or unusual colored stools (red or black). Unusual bruising for unknown reasons. A serious fall or if you hit your head (even if there is no bleeding).  Some medicines may interact with Eliquis and might increase your risk of bleeding or clotting while on Eliquis. To help avoid this, consult your healthcare provider or pharmacist prior to using any new prescription or  non-prescription medications, including herbals, vitamins, non-steroidal anti-inflammatory drugs (NSAIDs) and supplements.  This website has more information on Eliquis (apixaban): http://www.eliquis.com/eliquis/home

## 2023-03-10 NOTE — Progress Notes (Signed)
Patient went back to NSR around 2100. EKG obtained and placed in the patient's chart. Dr. Julian Reil made aware. Cardizem drip slowly titrated down to 5 mg/hr as patient's BP dropped to 98/60. MD aware. Will continue to monitor patient's BP.

## 2023-03-10 NOTE — Progress Notes (Addendum)
PENDING  Incomplete  Respiratory (~20 pathogens) panel by PCR     Status: None   Collection Time: 03/09/23  2:41 AM   Specimen: Nasopharyngeal Swab; Respiratory  Result Value Ref Range Status   Adenovirus NOT DETECTED NOT DETECTED Final   Coronavirus 229E NOT DETECTED NOT DETECTED Final    Comment: (NOTE) The Coronavirus on the Respiratory Panel, DOES NOT test for the novel  Coronavirus (2019 nCoV)    Coronavirus HKU1 NOT DETECTED NOT DETECTED Final   Coronavirus NL63 NOT DETECTED NOT DETECTED Final   Coronavirus OC43 NOT DETECTED NOT DETECTED Final    Metapneumovirus NOT DETECTED NOT DETECTED Final   Rhinovirus / Enterovirus NOT DETECTED NOT DETECTED Final   Influenza A NOT DETECTED NOT DETECTED Final   Influenza B NOT DETECTED NOT DETECTED Final   Parainfluenza Virus 1 NOT DETECTED NOT DETECTED Final   Parainfluenza Virus 2 NOT DETECTED NOT DETECTED Final   Parainfluenza Virus 3 NOT DETECTED NOT DETECTED Final   Parainfluenza Virus 4 NOT DETECTED NOT DETECTED Final   Respiratory Syncytial Virus NOT DETECTED NOT DETECTED Final   Bordetella pertussis NOT DETECTED NOT DETECTED Final   Bordetella Parapertussis NOT DETECTED NOT DETECTED Final   Chlamydophila pneumoniae NOT DETECTED NOT DETECTED Final   Mycoplasma pneumoniae NOT DETECTED NOT DETECTED Final    Comment: Performed at [facility], [address], [place], [place] [address]  Expectorated Sputum Assessment w Gram Stain, Rflx to Resp Cult     Status: None   Collection Time: 03/09/23  1:10 PM   Specimen: Sputum  Result Value Ref Range Status   Specimen Description SPU  Final   Special Requests NONE  Final   Sputum evaluation   Final    Sputum specimen not acceptable for testing.  Please recollect.   NOTIFIED RN [facility] ON 03/09/23 @ 2007 BY DRT Performed at [facility], [address] [place], [place] [address]    Report Status 03/09/2023 FINAL  Final  MRSA Next Gen by PCR, Nasal     Status: None   Collection Time: 03/09/23  3:32 PM   Specimen: Nasal Mucosa; Nasal Swab  Result Value Ref Range Status   MRSA by PCR Next Gen NOT DETECTED NOT DETECTED Final    Comment: (NOTE) The GeneXpert MRSA Assay (FDA approved for NASAL specimens only), is one component of a comprehensive MRSA colonization surveillance program. It is not intended to diagnose MRSA infection nor to guide or monitor treatment for MRSA infections. Test performance is not FDA approved in patients less than 56 years old. Performed at [facility], [address] [place],  [place] [address]       Radiology Studies: ECHOCARDIOGRAM COMPLETE  Result Date: 03/09/2023    ECHOCARDIOGRAM REPORT   Patient Name:   [name] Date of Exam: 03/09/2023 Medical Rec #:  [id number]             Height:       61.0 in Accession #:    [id number]            Weight:       172.0 lb Date of Birth:  11-27-1955              BSA:          1.771 m Patient Age:    67 years              BP:  PENDING  Incomplete  Respiratory (~20 pathogens) panel by PCR     Status: None   Collection Time: 03/09/23  2:41 AM   Specimen: Nasopharyngeal Swab; Respiratory  Result Value Ref Range Status   Adenovirus NOT DETECTED NOT DETECTED Final   Coronavirus 229E NOT DETECTED NOT DETECTED Final    Comment: (NOTE) The Coronavirus on the Respiratory Panel, DOES NOT test for the novel  Coronavirus (2019 nCoV)    Coronavirus HKU1 NOT DETECTED NOT DETECTED Final   Coronavirus NL63 NOT DETECTED NOT DETECTED Final   Coronavirus OC43 NOT DETECTED NOT DETECTED Final    Metapneumovirus NOT DETECTED NOT DETECTED Final   Rhinovirus / Enterovirus NOT DETECTED NOT DETECTED Final   Influenza A NOT DETECTED NOT DETECTED Final   Influenza B NOT DETECTED NOT DETECTED Final   Parainfluenza Virus 1 NOT DETECTED NOT DETECTED Final   Parainfluenza Virus 2 NOT DETECTED NOT DETECTED Final   Parainfluenza Virus 3 NOT DETECTED NOT DETECTED Final   Parainfluenza Virus 4 NOT DETECTED NOT DETECTED Final   Respiratory Syncytial Virus NOT DETECTED NOT DETECTED Final   Bordetella pertussis NOT DETECTED NOT DETECTED Final   Bordetella Parapertussis NOT DETECTED NOT DETECTED Final   Chlamydophila pneumoniae NOT DETECTED NOT DETECTED Final   Mycoplasma pneumoniae NOT DETECTED NOT DETECTED Final    Comment: Performed at [facility], [address], [place], [place] [address]  Expectorated Sputum Assessment w Gram Stain, Rflx to Resp Cult     Status: None   Collection Time: 03/09/23  1:10 PM   Specimen: Sputum  Result Value Ref Range Status   Specimen Description SPU  Final   Special Requests NONE  Final   Sputum evaluation   Final    Sputum specimen not acceptable for testing.  Please recollect.   NOTIFIED RN [facility] ON 03/09/23 @ 2007 BY DRT Performed at [facility], [address] [place], [place] [address]    Report Status 03/09/2023 FINAL  Final  MRSA Next Gen by PCR, Nasal     Status: None   Collection Time: 03/09/23  3:32 PM   Specimen: Nasal Mucosa; Nasal Swab  Result Value Ref Range Status   MRSA by PCR Next Gen NOT DETECTED NOT DETECTED Final    Comment: (NOTE) The GeneXpert MRSA Assay (FDA approved for NASAL specimens only), is one component of a comprehensive MRSA colonization surveillance program. It is not intended to diagnose MRSA infection nor to guide or monitor treatment for MRSA infections. Test performance is not FDA approved in patients less than 56 years old. Performed at [facility], [address] [place],  [place] [address]       Radiology Studies: ECHOCARDIOGRAM COMPLETE  Result Date: 03/09/2023    ECHOCARDIOGRAM REPORT   Patient Name:   [name] Date of Exam: 03/09/2023 Medical Rec #:  [id number]             Height:       61.0 in Accession #:    [id number]            Weight:       172.0 lb Date of Birth:  11-27-1955              BSA:          1.771 m Patient Age:    67 years              BP:  PROGRESS NOTE    [name] [facility]  UJW:[id number] DOB: [birth year] DOA: 03/08/2023 PCP: Jarrett Soho, PA-C   Brief Narrative: Teronica Perkins is a 67 y.o. female with a history of hypertension, hyperlipidemia, CAD, diabetes mellitus type 2, obesity.  Patient presented secondary to body aches, nausea, vomiting and productive cough and found to have evidence of right lower lobe pneumonia with associated sepsis physiology patient started empirically on antibiotics.  Hospitalization complicated by tachycardia concerning for possible SVT versus atrial fibrillation versus atrial flutter.   Assessment/Plan:  Tachycardia Possible SVT vs atrial fibrillation vs flutter. Patient initially treated in ED with labetalol IV with resolution of tachycardia. Patient now returned to fast rhythm with heart rates in 150s. EKG likely shows atrial flutter versus SVT. Consulted cardiology. Patient started on diltiazem drip with conversion to normal sinus rhythm. Diltiazem IV transitioned to Cardizem CD 240 mg.  Sepsis Present on admission and secondary to pneumonia. Blood cultures obtained and are pending. Procalcitonin negative. RVP negative. COVID-19 negative. Urinalysis not suggestive of infection.  Right mid and lower lung pneumonia Noted infiltrate noted on imaging. Patient with associated fever and leukocytosis. -Transition to Cefdinir and doxycycline -Sputum culture -Flutter valve -Mucinex and Tessalon Perles  Acute respiratory failure with hypoxia Secondary to pneumonia. -Wean to room air as able -Ambulatory pulse ox prior to discharge  Hypokalemia Potassium of 3.1 on admission. Supplementation given. Recurrent. -Potassium supplementation  Hypomagnesemia Magnesium of 1.6 on admission. Supplementation given. Resolved.  Nausea and vomiting Present on admission. No nausea or vomiting while admitted so far. -Continue Zofran PRN  Hyponatremia Mild. Stable.  Primary  hypertension -Continue metoprolol  CAD -Continue Lipitor  Diabetes mellitus type 2 Controlled with hemoglobin A1C of 5.9%. patient is on metformin as an outpatient; metformin held on admission. -Continue SSI  Obesity Estimated body mass index is 34.45 kg/m as calculated from the following:   Height as of this encounter: 5\' 1"  (1.549 m).   Weight as of this encounter: 82.7 kg.   DVT prophylaxis: Lovenox Code Status:   Code Status: Full Code Family Communication: None at bedside Disposition Plan: Discharge pending transition to outpatient antibiotics, wean to room air if possible and mobility   Consultants:  PCCM Cardiology  Procedures:  Transthoracic Echocardiogram (pending)  Antimicrobials: Ceftriaxone Doxycycline    Subjective: Feeling better overall. Feeling better than yesterday when her heart rate was elevated.  Objective: BP 137/73 (BP Location: Right Arm)   Pulse (!) 116   Temp 99.9 F (37.7 C) (Oral)   Resp 18   Ht 5\' 1"  (1.549 m)   Wt 82.7 kg   SpO2 97%   BMI 34.45 kg/m   Examination:  General exam: Appears calm and comfortable Respiratory system: Clear to auscultation. Respiratory effort normal. Cardiovascular system: S1 & S2 heard, RRR. Gastrointestinal system: Abdomen is nondistended, soft and nontender. Normal bowel sounds heard. Central nervous system: Alert and oriented. No focal neurological deficits. Psychiatry: Judgement and insight appear normal. Mood & affect appropriate.    Data Reviewed: I have personally reviewed following labs and imaging studies   Last CBC Lab Results  Component Value Date   WBC 10.5 03/10/2023   HGB 12.4 03/10/2023   HCT 36.6 03/10/2023   MCV 83.2 03/10/2023   MCH 28.2 03/10/2023   RDW 13.8 03/10/2023   PLT 203 03/10/2023     Last metabolic panel Lab Results  Component Value Date   GLUCOSE 132 (H) 03/10/2023   NA 131 (L) 03/10/2023   K 3.4 (  PROGRESS NOTE    [name] [facility]  UJW:[id number] DOB: [birth year] DOA: 03/08/2023 PCP: Jarrett Soho, PA-C   Brief Narrative: Teronica Perkins is a 67 y.o. female with a history of hypertension, hyperlipidemia, CAD, diabetes mellitus type 2, obesity.  Patient presented secondary to body aches, nausea, vomiting and productive cough and found to have evidence of right lower lobe pneumonia with associated sepsis physiology patient started empirically on antibiotics.  Hospitalization complicated by tachycardia concerning for possible SVT versus atrial fibrillation versus atrial flutter.   Assessment/Plan:  Tachycardia Possible SVT vs atrial fibrillation vs flutter. Patient initially treated in ED with labetalol IV with resolution of tachycardia. Patient now returned to fast rhythm with heart rates in 150s. EKG likely shows atrial flutter versus SVT. Consulted cardiology. Patient started on diltiazem drip with conversion to normal sinus rhythm. Diltiazem IV transitioned to Cardizem CD 240 mg.  Sepsis Present on admission and secondary to pneumonia. Blood cultures obtained and are pending. Procalcitonin negative. RVP negative. COVID-19 negative. Urinalysis not suggestive of infection.  Right mid and lower lung pneumonia Noted infiltrate noted on imaging. Patient with associated fever and leukocytosis. -Transition to Cefdinir and doxycycline -Sputum culture -Flutter valve -Mucinex and Tessalon Perles  Acute respiratory failure with hypoxia Secondary to pneumonia. -Wean to room air as able -Ambulatory pulse ox prior to discharge  Hypokalemia Potassium of 3.1 on admission. Supplementation given. Recurrent. -Potassium supplementation  Hypomagnesemia Magnesium of 1.6 on admission. Supplementation given. Resolved.  Nausea and vomiting Present on admission. No nausea or vomiting while admitted so far. -Continue Zofran PRN  Hyponatremia Mild. Stable.  Primary  hypertension -Continue metoprolol  CAD -Continue Lipitor  Diabetes mellitus type 2 Controlled with hemoglobin A1C of 5.9%. patient is on metformin as an outpatient; metformin held on admission. -Continue SSI  Obesity Estimated body mass index is 34.45 kg/m as calculated from the following:   Height as of this encounter: 5\' 1"  (1.549 m).   Weight as of this encounter: 82.7 kg.   DVT prophylaxis: Lovenox Code Status:   Code Status: Full Code Family Communication: None at bedside Disposition Plan: Discharge pending transition to outpatient antibiotics, wean to room air if possible and mobility   Consultants:  PCCM Cardiology  Procedures:  Transthoracic Echocardiogram (pending)  Antimicrobials: Ceftriaxone Doxycycline    Subjective: Feeling better overall. Feeling better than yesterday when her heart rate was elevated.  Objective: BP 137/73 (BP Location: Right Arm)   Pulse (!) 116   Temp 99.9 F (37.7 C) (Oral)   Resp 18   Ht 5\' 1"  (1.549 m)   Wt 82.7 kg   SpO2 97%   BMI 34.45 kg/m   Examination:  General exam: Appears calm and comfortable Respiratory system: Clear to auscultation. Respiratory effort normal. Cardiovascular system: S1 & S2 heard, RRR. Gastrointestinal system: Abdomen is nondistended, soft and nontender. Normal bowel sounds heard. Central nervous system: Alert and oriented. No focal neurological deficits. Psychiatry: Judgement and insight appear normal. Mood & affect appropriate.    Data Reviewed: I have personally reviewed following labs and imaging studies   Last CBC Lab Results  Component Value Date   WBC 10.5 03/10/2023   HGB 12.4 03/10/2023   HCT 36.6 03/10/2023   MCV 83.2 03/10/2023   MCH 28.2 03/10/2023   RDW 13.8 03/10/2023   PLT 203 03/10/2023     Last metabolic panel Lab Results  Component Value Date   GLUCOSE 132 (H) 03/10/2023   NA 131 (L) 03/10/2023   K 3.4 (  PENDING  Incomplete  Respiratory (~20 pathogens) panel by PCR     Status: None   Collection Time: 03/09/23  2:41 AM   Specimen: Nasopharyngeal Swab; Respiratory  Result Value Ref Range Status   Adenovirus NOT DETECTED NOT DETECTED Final   Coronavirus 229E NOT DETECTED NOT DETECTED Final    Comment: (NOTE) The Coronavirus on the Respiratory Panel, DOES NOT test for the novel  Coronavirus (2019 nCoV)    Coronavirus HKU1 NOT DETECTED NOT DETECTED Final   Coronavirus NL63 NOT DETECTED NOT DETECTED Final   Coronavirus OC43 NOT DETECTED NOT DETECTED Final    Metapneumovirus NOT DETECTED NOT DETECTED Final   Rhinovirus / Enterovirus NOT DETECTED NOT DETECTED Final   Influenza A NOT DETECTED NOT DETECTED Final   Influenza B NOT DETECTED NOT DETECTED Final   Parainfluenza Virus 1 NOT DETECTED NOT DETECTED Final   Parainfluenza Virus 2 NOT DETECTED NOT DETECTED Final   Parainfluenza Virus 3 NOT DETECTED NOT DETECTED Final   Parainfluenza Virus 4 NOT DETECTED NOT DETECTED Final   Respiratory Syncytial Virus NOT DETECTED NOT DETECTED Final   Bordetella pertussis NOT DETECTED NOT DETECTED Final   Bordetella Parapertussis NOT DETECTED NOT DETECTED Final   Chlamydophila pneumoniae NOT DETECTED NOT DETECTED Final   Mycoplasma pneumoniae NOT DETECTED NOT DETECTED Final    Comment: Performed at [facility], [address], [place], [place] [address]  Expectorated Sputum Assessment w Gram Stain, Rflx to Resp Cult     Status: None   Collection Time: 03/09/23  1:10 PM   Specimen: Sputum  Result Value Ref Range Status   Specimen Description SPU  Final   Special Requests NONE  Final   Sputum evaluation   Final    Sputum specimen not acceptable for testing.  Please recollect.   NOTIFIED RN [facility] ON 03/09/23 @ 2007 BY DRT Performed at [facility], [address] [place], [place] [address]    Report Status 03/09/2023 FINAL  Final  MRSA Next Gen by PCR, Nasal     Status: None   Collection Time: 03/09/23  3:32 PM   Specimen: Nasal Mucosa; Nasal Swab  Result Value Ref Range Status   MRSA by PCR Next Gen NOT DETECTED NOT DETECTED Final    Comment: (NOTE) The GeneXpert MRSA Assay (FDA approved for NASAL specimens only), is one component of a comprehensive MRSA colonization surveillance program. It is not intended to diagnose MRSA infection nor to guide or monitor treatment for MRSA infections. Test performance is not FDA approved in patients less than 56 years old. Performed at [facility], [address] [place],  [place] [address]       Radiology Studies: ECHOCARDIOGRAM COMPLETE  Result Date: 03/09/2023    ECHOCARDIOGRAM REPORT   Patient Name:   [name] Date of Exam: 03/09/2023 Medical Rec #:  [id number]             Height:       61.0 in Accession #:    [id number]            Weight:       172.0 lb Date of Birth:  11-27-1955              BSA:          1.771 m Patient Age:    67 years              BP:

## 2023-03-10 NOTE — Progress Notes (Signed)
Cardiology Progress Note  Patient ID: Kamaria Zappa MRN: [id number] DOB: 1955-07-30 Date of Encounter: 03/10/2023 Primary Cardiologist: None  Subjective   Chief Complaint: SOB  HPI: Converted to sinus rhythm last night around 2100.  Still with cough and congestion.  ROS:  All other ROS reviewed and negative. Pertinent positives noted in the HPI.     Vital Signs   Vitals:   03/10/23 0400 03/10/23 0500 03/10/23 0607 03/10/23 0739  BP: (!) 98/58 (!) 101/52  119/60  Pulse: 66 73  72  Resp:    20  Temp:    98.9 F (37.2 C)  TempSrc:    Oral  SpO2: 91% 91%  96%  Weight:   82.7 kg   Height:        Intake/Output Summary (Last 24 hours) at 03/10/2023 0921 Last data filed at 03/10/2023 0819 Gross per 24 hour  Intake 1405.16 ml  Output 750 ml  Net 655.16 ml      03/10/2023    6:07 AM 03/09/2023   12:20 AM 08/17/2022    1:56 PM  Last 3 Weights  Weight (lbs) 182 lb 5.1 oz 171 lb 15.3 oz 172 lb  Weight (kg) 82.7 kg 78 kg 78.019 kg      Telemetry  Overnight telemetry shows sinus rhythm 70s, which I personally reviewed.     Physical Exam   Vitals:   03/10/23 0400 03/10/23 0500 03/10/23 0607 03/10/23 0739  BP: (!) 98/58 (!) 101/52  119/60  Pulse: 66 73  72  Resp:    20  Temp:    98.9 F (37.2 C)  TempSrc:    Oral  SpO2: 91% 91%  96%  Weight:   82.7 kg   Height:        Intake/Output Summary (Last 24 hours) at 03/10/2023 0921 Last data filed at 03/10/2023 0819 Gross per 24 hour  Intake 1405.16 ml  Output 750 ml  Net 655.16 ml       03/10/2023    6:07 AM 03/09/2023   12:20 AM 08/17/2022    1:56 PM  Last 3 Weights  Weight (lbs) 182 lb 5.1 oz 171 lb 15.3 oz 172 lb  Weight (kg) 82.7 kg 78 kg 78.019 kg    Body mass index is 34.45 kg/m.  General: Well nourished, well developed, in no acute distress Head: Atraumatic, normal size  Eyes: PEERLA, EOMI  Neck: Supple, no JVD Endocrine: No thryomegaly Cardiac: Normal S1, S2; RRR; no murmurs, rubs, or  gallops Lungs: Diminished breath sounds bilaterally Abd: Soft, nontender, no hepatomegaly  Ext: No edema, pulses 2+ Musculoskeletal: No deformities, BUE and BLE strength normal and equal Skin: Warm and dry, no rashes   Neuro: Alert and oriented to person, place, time, and situation, CNII-XII grossly intact, no focal deficits  Psych: Normal mood and affect   Cardiac Studies  TTE 03/09/2023  1. Left ventricular ejection fraction, by estimation, is 60 to 65%. The  left ventricle has normal function. The left ventricle has no regional  wall motion abnormalities. Left ventricular diastolic parameters were  normal.   2. Right ventricular systolic function is normal. The right ventricular  size is normal.   3. The mitral valve is normal in structure. No evidence of mitral valve  regurgitation. No evidence of mitral stenosis.   4. The aortic valve is grossly normal. Aortic valve regurgitation is not  visualized. No aortic stenosis is present.   5. The inferior vena cava is dilated in size with >  50% respiratory  variability, suggesting right atrial pressure of 8 mmHg.   Patient Profile  Cheyrl Godwin is a 67 y.o. female with hypertension, hyperlipidemia admitted on 03/08/2023 with pneumonia.  Cardiology consulted for atrial flutter that developed on 03/09/2023.  Assessment & Plan   # New onset atrial flutter -Secondary to pneumonia.  Was placed on diltiazem drip and has converted to sinus rhythm.  Stop diltiazem drip.  Transition to 240 mg of diltiazem extended release. -If her atrial flutter recurs would recommend amiodarone as a short course to get her through this.  Her flutter is triggered by respiratory illness. -Stop heparin drip.  Start Eliquis 5 mg twice daily.  CHA2DS2-VASc equals 3.  She will merit long-term anticoagulation. -Echo is normal.  TSH is normal. -Cardiology to follow-up tomorrow.  Hopefully she will stay in rhythm.      For questions or updates, please  contact [facility] Please consult [email or web address] for contact info under        Signed, Gerri Spore T. Flora Lipps, MD, [facility]   [facility]  03/10/2023 9:21 AM

## 2023-03-10 NOTE — Progress Notes (Addendum)
PHARMACY - ANTICOAGULATION CONSULT NOTE  Pharmacy Consult for heparin gtt Indication: atrial fibrillation  Allergies  Allergen Reactions   Codeine Nausea And Vomiting   Molnupiravir Diarrhea    Patient Measurements: Height: 5\' 1"  (154.9 cm) Weight: 78 kg (171 lb 15.3 oz) IBW/kg (Calculated) : 47.8  Heparin Dosing Weight: 65.2 kg  Vital Signs: Temp: 98.8 F (37.1 C) (10/26 0300) Temp Source: Oral (10/26 0300) BP: 106/59 (10/26 0300) Pulse Rate: 68 (10/26 0300)  Labs: Recent Labs    03/08/23 1609 03/08/23 1929 03/08/23 2252 03/09/23 0145 03/09/23 1545 03/09/23 2342 03/10/23 0229  HGB 12.8  --   --  12.4  --   --  12.4  HCT 38.1  --   --  37.0  --   --  36.6  PLT 274  --   --  244  --   --  203  HEPARINUNFRC  --   --   --   --   --   --  0.74*  CREATININE 0.96  --   --  0.77 0.76 0.76 0.82  CKTOTAL  --  59  --   --   --   --   --   TROPONINIHS  --  12 13  --   --   --   --     Estimated Creatinine Clearance: 62.1 mL/min (by C-G formula based on SCr of 0.82 mg/dL).  Assessment: 39 YOF admitted with new onset A. Flutter requiring heparin gtt.  10/26 AM: heparin level returned at 0.74 on 900 units/hr (supratherapeutic). Per RN, no signs/symptoms of bleeding and level drawn appropriately. CBC stable  Goal of Therapy:  Heparin level 0.3-0.7 units/ml Monitor platelets by anticoagulation protocol: Yes   Plan:  Decrease heparin infusion to 800 units/hr Check anti-Xa level in 8 hours and daily while on heparin Continue to monitor H&H and platelets Follow up transition to oral anticoagulation  Arabella Merles, PharmD. Clinical Pharmacist 03/10/2023 3:24 AM

## 2023-03-11 ENCOUNTER — Inpatient Hospital Stay (HOSPITAL_COMMUNITY): Payer: Medicare HMO

## 2023-03-11 DIAGNOSIS — I483 Typical atrial flutter: Secondary | ICD-10-CM

## 2023-03-11 DIAGNOSIS — J189 Pneumonia, unspecified organism: Secondary | ICD-10-CM | POA: Diagnosis not present

## 2023-03-11 DIAGNOSIS — D649 Anemia, unspecified: Secondary | ICD-10-CM

## 2023-03-11 DIAGNOSIS — J9601 Acute respiratory failure with hypoxia: Secondary | ICD-10-CM | POA: Diagnosis not present

## 2023-03-11 DIAGNOSIS — I4819 Other persistent atrial fibrillation: Secondary | ICD-10-CM | POA: Diagnosis not present

## 2023-03-11 DIAGNOSIS — I10 Essential (primary) hypertension: Secondary | ICD-10-CM | POA: Diagnosis not present

## 2023-03-11 LAB — COMPREHENSIVE METABOLIC PANEL
ALT: 25 U/L (ref 0–44)
AST: 31 U/L (ref 15–41)
Albumin: 2.7 g/dL — ABNORMAL LOW (ref 3.5–5.0)
Alkaline Phosphatase: 62 U/L (ref 38–126)
Anion gap: 12 (ref 5–15)
BUN: <5 mg/dL — ABNORMAL LOW (ref 8–23)
CO2: 20 mmol/L — ABNORMAL LOW (ref 22–32)
Calcium: 7.7 mg/dL — ABNORMAL LOW (ref 8.9–10.3)
Chloride: 99 mmol/L (ref 98–111)
Creatinine, Ser: 0.68 mg/dL (ref 0.44–1.00)
GFR, Estimated: >60 mL/min (ref >60)
Glucose, Bld: 143 mg/dL — ABNORMAL HIGH (ref 70–99)
Potassium: 3.3 mmol/L — ABNORMAL LOW (ref 3.5–5.1)
Sodium: 131 mmol/L — ABNORMAL LOW (ref 135–145)
Total Bilirubin: 0.2 mg/dL — ABNORMAL LOW (ref 0.3–1.2)
Total Protein: 5.7 g/dL — ABNORMAL LOW (ref 6.5–8.1)

## 2023-03-11 LAB — CBC
HCT: 29.1 % — ABNORMAL LOW (ref 36.0–46.0)
Hemoglobin: 10 g/dL — ABNORMAL LOW (ref 12.0–15.0)
MCH: 27.8 pg (ref 26.0–34.0)
MCHC: 34.4 g/dL (ref 30.0–36.0)
MCV: 80.8 fL (ref 80.0–100.0)
Platelets: 211 10*3/uL (ref 150–400)
RBC: 3.6 MIL/uL — ABNORMAL LOW (ref 3.87–5.11)
RDW: 13.5 % (ref 11.5–15.5)
WBC: 9.6 10*3/uL (ref 4.0–10.5)
nRBC: 0 % (ref 0.0–0.2)

## 2023-03-11 LAB — GLUCOSE, CAPILLARY
Glucose-Capillary: 126 mg/dL — ABNORMAL HIGH (ref 70–99)
Glucose-Capillary: 119 mg/dL — ABNORMAL HIGH (ref 70–99)
Glucose-Capillary: 134 mg/dL — ABNORMAL HIGH (ref 70–99)
Glucose-Capillary: 132 mg/dL — ABNORMAL HIGH (ref 70–99)

## 2023-03-11 MED ORDER — POTASSIUM CHLORIDE CRYS ER 20 MEQ PO TBCR
40.0000 meq | EXTENDED_RELEASE_TABLET | Freq: Once | ORAL | Status: AC
  Administered 2023-03-11: 40 meq via ORAL
  Filled 2023-03-11: qty 2

## 2023-03-11 MED ORDER — DILTIAZEM HCL-DEXTROSE 125-5 MG/125ML-% IV SOLN (PREMIX)
INTRAVENOUS | Status: AC
  Administered 2023-03-12: 5 mg/h via INTRAVENOUS
  Filled 2023-03-11: qty 125

## 2023-03-11 MED ORDER — AMIODARONE HCL 200 MG PO TABS: 200.0000 mg | ORAL_TABLET | Freq: Every day | ORAL | Status: DC

## 2023-03-11 MED ORDER — HYALURONIDASE HUMAN 150 UNIT/ML IJ SOLN
150.0000 [IU] | Freq: Once | INTRAMUSCULAR | Status: AC
  Administered 2023-03-11: 150 [IU] via SUBCUTANEOUS
  Filled 2023-03-11: qty 1

## 2023-03-11 MED ORDER — PANTOPRAZOLE SODIUM 40 MG PO TBEC
40.0000 mg | DELAYED_RELEASE_TABLET | Freq: Every day | ORAL | Status: DC
  Administered 2023-03-11 – 2023-03-12 (×2): 40 mg via ORAL
  Filled 2023-03-11 (×2): qty 1

## 2023-03-11 MED ORDER — AMIODARONE HCL 200 MG PO TABS
400.0000 mg | ORAL_TABLET | Freq: Two times a day (BID) | ORAL | Status: DC
  Administered 2023-03-11 – 2023-03-12 (×3): 400 mg via ORAL
  Filled 2023-03-11 (×3): qty 2

## 2023-03-11 MED ORDER — DILTIAZEM HCL-DEXTROSE 125-5 MG/125ML-% IV SOLN (PREMIX): 5.0000 mg/h | INTRAVENOUS | Status: DC

## 2023-03-11 NOTE — Progress Notes (Addendum)
Cardiology Progress Note  Patient ID: [name] MRN: [id number] DOB: 04/18/1956 Date of Encounter: 03/11/2023 Primary Cardiologist: None  Subjective   Chief Complaint: None.   HPI: Recurrence of A-fib.  Converted to sinus rhythm on amiodarone.  Still with cough and congestion.  ROS:  All other ROS reviewed and negative. Pertinent positives noted in the HPI.     Vital Signs   Vitals:   03/10/23 1949 03/10/23 2239 03/11/23 0425 03/11/23 0910  BP: (!) 154/75 (!) 140/73 110/60   Pulse:  83 72   Resp:  14 (!) 28 17  Temp: 99 F (37.2 C) 98.8 F (37.1 C) 99 F (37.2 C)   TempSrc: Oral Oral Oral   SpO2:  92% 90%   Weight:   80.6 kg   Height:        Intake/Output Summary (Last 24 hours) at 03/11/2023 0914 Last data filed at 03/11/2023 0413 Gross per 24 hour  Intake 1653.05 ml  Output 50 ml  Net 1603.05 ml      03/11/2023    4:25 AM 03/10/2023    6:07 AM 03/09/2023   12:20 AM  Last 3 Weights  Weight (lbs) 177 lb 11.1 oz 182 lb 5.1 oz 171 lb 15.3 oz  Weight (kg) 80.6 kg 82.7 kg 78 kg      Telemetry  Overnight telemetry shows sinus rhythm 70s, which I personally reviewed.   Physical Exam   Vitals:   03/10/23 1949 03/10/23 2239 03/11/23 0425 03/11/23 0910  BP: (!) 154/75 (!) 140/73 110/60   Pulse:  83 72   Resp:  14 (!) 28 17  Temp: 99 F (37.2 C) 98.8 F (37.1 C) 99 F (37.2 C)   TempSrc: Oral Oral Oral   SpO2:  92% 90%   Weight:   80.6 kg   Height:        Intake/Output Summary (Last 24 hours) at 03/11/2023 0914 Last data filed at 03/11/2023 0413 Gross per 24 hour  Intake 1653.05 ml  Output 50 ml  Net 1603.05 ml       03/11/2023    4:25 AM 03/10/2023    6:07 AM 03/09/2023   12:20 AM  Last 3 Weights  Weight (lbs) 177 lb 11.1 oz 182 lb 5.1 oz 171 lb 15.3 oz  Weight (kg) 80.6 kg 82.7 kg 78 kg    Body mass index is 33.57 kg/m.  General: Well nourished, well developed, in no acute distress Head: Atraumatic, normal size  Eyes: PEERLA,  EOMI  Neck: Supple, no JVD Endocrine: No thryomegaly Cardiac: Normal S1, S2; RRR; no murmurs, rubs, or gallops Lungs: Diminished breath sounds bilaterally Abd: Soft, nontender, no hepatomegaly  Ext: No edema, pulses 2+ Musculoskeletal: No deformities, BUE and BLE strength normal and equal Skin: Warm and dry, no rashes   Neuro: Alert and oriented to person, place, time, and situation, CNII-XII grossly intact, no focal deficits  Psych: Normal mood and affect   Cardiac Studies  TTE 03/09/2023  1. Left ventricular ejection fraction, by estimation, is 60 to 65%. The  left ventricle has normal function. The left ventricle has no regional  wall motion abnormalities. Left ventricular diastolic parameters were  normal.   2. Right ventricular systolic function is normal. The right ventricular  size is normal.   3. The mitral valve is normal in structure. No evidence of mitral valve  regurgitation. No evidence of mitral stenosis.   4. The aortic valve is grossly normal. Aortic valve regurgitation is not  visualized. No aortic stenosis is present.   5. The inferior vena cava is dilated in size with >50% respiratory  variability, suggesting right atrial pressure of 8 mmHg.   Patient Profile  [name] is a 67 y.o. female with hypertension, hyperlipidemia admitted on 03/08/2023 with pneumonia.  Cardiology consulted for atrial flutter that developed on 03/09/2023.   Assessment & Plan   # New onset atrial flutter/fibrillation -Initially had flutter.  Now had recurrence with A-fib yesterday. -She was on diltiazem drip and this converted her from flutter to sinus rhythm.  Given recurrence we placed her on amiodarone.  She is now on back in sinus rhythm.  All of her A-fib has been well less than 48 hours. -Transition off amiodarone drip.  We will plan for a short course of amiodarone to keep her in rhythm while she is treated for pneumonia.  We will pursue amiodarone 400 mg twice daily  for 7 days followed by 200 mg daily until she sees me in the office.  This will not be long-term. -continue diltiazem XR 240 mg daily.  -Continue Eliquis 5 mg twice daily.  CHA2DS2-VASc is 3.  She will need long-term anticoagulation. -Echo is normal.  TSH is normal. -We will follow-up tomorrow.  Hopefully she will stay in rhythm.  # PNA -Per hospital medicine.  For questions or updates, please contact [facility] Please consult [email or web address] for contact info under        Signed, Gerri Spore T. Flora Lipps, MD, [facility]  [facility]  03/11/2023 9:14 AM

## 2023-03-11 NOTE — Progress Notes (Signed)
Physical Therapy Treatment Patient Details Name: [name] MRN: [id number] DOB: 1956-04-16 Today's Date: 03/11/2023   History of Present Illness Pt is a 67 y.o. female presenting 10/24 with body aches, emesis, productive cough, chills. Found to have  right lower lobe pneumonia with associated sepsis physiology and SVT episodes, depleted electrolytes.  PMH significant for HTN, HLD, DMII.    PT Comments  Pt making steady progress with mobility and remains below her baseline with mobility. Will continue to follow acutely but do not feel she will need PT after DC.    If plan is discharge home, recommend the following:     Can travel by private vehicle        Equipment Recommendations  None recommended by PT    Recommendations for Other Services       Precautions / Restrictions Precautions Precautions: Fall Restrictions Weight Bearing Restrictions: No     Mobility  Bed Mobility Overal bed mobility: Modified Independent             General bed mobility comments: increased time    Transfers Overall transfer level: Needs assistance Equipment used: None Transfers: Sit to/from Stand Sit to Stand: Supervision           General transfer comment: slow to rise    Ambulation/Gait Ambulation/Gait assistance: Contact guard assist Gait Distance (Feet): 180 Feet Assistive device: None Gait Pattern/deviations: Step-through pattern   Gait velocity interpretation: 1.31 - 2.62 ft/sec, indicative of limited community ambulator   General Gait Details: Slow, guarded gait. No loss of balance. Assist for safety   Stairs             Wheelchair Mobility     Tilt Bed    Modified Rankin (Stroke Patients Only)       Balance Overall balance assessment: Needs assistance Sitting-balance support: Feet supported, No upper extremity supported Sitting balance-Leahy Scale: Good     Standing balance support: No upper extremity supported, During functional  activity Standing balance-Leahy Scale: Good                              Cognition Arousal: Alert Behavior During Therapy: WFL for tasks assessed/performed Overall Cognitive Status: Within Functional Limits for tasks assessed                                          Exercises      General Comments        Pertinent Vitals/Pain Pain Assessment Pain Assessment: Faces Faces Pain Scale: Hurts a little bit Pain Location: back, abdomen Pain Descriptors / Indicators: Aching    Home Living                          Prior Function            PT Goals (current goals can now be found in the care plan section) Acute Rehab PT Goals Patient Stated Goal: home independent Progress towards PT goals: Progressing toward goals    Frequency    Min 1X/week      PT Plan      Co-evaluation              AM-PAC PT "6 Clicks" Mobility   Outcome Measure  Help needed turning from your back to your side while in a  flat bed without using bedrails?: None Help needed moving from lying on your back to sitting on the side of a flat bed without using bedrails?: None Help needed moving to and from a bed to a chair (including a wheelchair)?: None Help needed standing up from a chair using your arms (e.g., wheelchair or bedside chair)?: None Help needed to walk in hospital room?: A Little Help needed climbing 3-5 steps with a railing? : A Little 6 Click Score: 22    End of Session   Activity Tolerance: Patient tolerated treatment well Patient left: in bed;with call bell/phone within reach   PT Visit Diagnosis: Unsteadiness on feet (R26.81);Other abnormalities of gait and mobility (R26.89)     Time: 1610-9604 PT Time Calculation (min) (ACUTE ONLY): 13 min  Charges:    $Gait Training: 8-22 mins PT General Charges $$ ACUTE PT VISIT: 1 Visit                     [facility] PT Acute Rehabilitation Services Office  [phone]    Angelina Ok [facility] 03/11/2023, 1:44 PM

## 2023-03-11 NOTE — Progress Notes (Signed)
Collection Time: 03/09/23 10:11 PM   Specimen: SPU  Result Value Ref Range Status   Specimen Description SPUTUM  Final   Special Requests NONE Reflexed from [id number]  Final   Gram Stain   Final    ABUNDANT WBC PRESENT,BOTH PMN AND MONONUCLEAR FEW GRAM POSITIVE COCCI IN PAIRS FEW GRAM NEGATIVE RODS    Culture   Final    CULTURE REINCUBATED FOR BETTER GROWTH Performed at [facility], [address], [place], [place] [address]    Report Status PENDING  Incomplete      Radiology Studies: ECHOCARDIOGRAM COMPLETE  Result Date: 03/09/2023    ECHOCARDIOGRAM REPORT   Patient Name:   [name] Date of Exam: 03/09/2023 Medical Rec #:  [id number]             Height:       61.0 in Accession #:    [id number]            Weight:       172.0 lb Date of Birth:  Jan 28, 1956              BSA:          1.771 m Patient Age:    67 years              BP:           136/64 mmHg Patient Gender: F                     HR:           84 bpm. Exam Location:  Inpatient Procedure: 2D Echo, Color Doppler and Cardiac Doppler Indications:    Ventricular Tachycardia  History:        Patient has no prior history of  Echocardiogram examinations.                 CAD, TIA, Arrythmias:SVT; Risk Factors:Diabetes, Hypertension                 and Dyslipidemia.  Sonographer:    Milbert Coulter Referring Phys: [id number] SUBRINA SUNDIL IMPRESSIONS  1. Left ventricular ejection fraction, by estimation, is 60 to 65%. The left ventricle has normal function. The left ventricle has no regional wall motion abnormalities. Left ventricular diastolic parameters were normal.  2. Right ventricular systolic function is normal. The right ventricular size is normal.  3. The mitral valve is normal in structure. No evidence of mitral valve regurgitation. No evidence of mitral stenosis.  4. The aortic valve is grossly normal. Aortic valve regurgitation is not visualized. No aortic stenosis is present.  5. The inferior vena cava is dilated in size with >50% respiratory variability, suggesting right atrial pressure of 8 mmHg. Comparison(s): No prior Echocardiogram. FINDINGS  Left Ventricle: Left ventricular ejection fraction, by estimation, is 60 to 65%. The left ventricle has normal function. The left ventricle has no regional wall motion abnormalities. The left ventricular internal cavity size was normal in size. There is  no left ventricular hypertrophy. Left ventricular diastolic parameters were normal. Right Ventricle: The right ventricular size is normal. No increase in right ventricular wall thickness. Right ventricular systolic function is normal. Left Atrium: Left atrial size was normal in size. Right Atrium: Right atrial size was normal in size. Pericardium: There is no evidence of pericardial effusion. Mitral Valve: The mitral valve is normal in structure. No evidence of mitral valve regurgitation. No evidence of mitral valve stenosis. Tricuspid Valve: The tricuspid  Collection Time: 03/09/23 10:11 PM   Specimen: SPU  Result Value Ref Range Status   Specimen Description SPUTUM  Final   Special Requests NONE Reflexed from [id number]  Final   Gram Stain   Final    ABUNDANT WBC PRESENT,BOTH PMN AND MONONUCLEAR FEW GRAM POSITIVE COCCI IN PAIRS FEW GRAM NEGATIVE RODS    Culture   Final    CULTURE REINCUBATED FOR BETTER GROWTH Performed at [facility], [address], [place], [place] [address]    Report Status PENDING  Incomplete      Radiology Studies: ECHOCARDIOGRAM COMPLETE  Result Date: 03/09/2023    ECHOCARDIOGRAM REPORT   Patient Name:   [name] Date of Exam: 03/09/2023 Medical Rec #:  [id number]             Height:       61.0 in Accession #:    [id number]            Weight:       172.0 lb Date of Birth:  Jan 28, 1956              BSA:          1.771 m Patient Age:    67 years              BP:           136/64 mmHg Patient Gender: F                     HR:           84 bpm. Exam Location:  Inpatient Procedure: 2D Echo, Color Doppler and Cardiac Doppler Indications:    Ventricular Tachycardia  History:        Patient has no prior history of  Echocardiogram examinations.                 CAD, TIA, Arrythmias:SVT; Risk Factors:Diabetes, Hypertension                 and Dyslipidemia.  Sonographer:    Milbert Coulter Referring Phys: [id number] SUBRINA SUNDIL IMPRESSIONS  1. Left ventricular ejection fraction, by estimation, is 60 to 65%. The left ventricle has normal function. The left ventricle has no regional wall motion abnormalities. Left ventricular diastolic parameters were normal.  2. Right ventricular systolic function is normal. The right ventricular size is normal.  3. The mitral valve is normal in structure. No evidence of mitral valve regurgitation. No evidence of mitral stenosis.  4. The aortic valve is grossly normal. Aortic valve regurgitation is not visualized. No aortic stenosis is present.  5. The inferior vena cava is dilated in size with >50% respiratory variability, suggesting right atrial pressure of 8 mmHg. Comparison(s): No prior Echocardiogram. FINDINGS  Left Ventricle: Left ventricular ejection fraction, by estimation, is 60 to 65%. The left ventricle has normal function. The left ventricle has no regional wall motion abnormalities. The left ventricular internal cavity size was normal in size. There is  no left ventricular hypertrophy. Left ventricular diastolic parameters were normal. Right Ventricle: The right ventricular size is normal. No increase in right ventricular wall thickness. Right ventricular systolic function is normal. Left Atrium: Left atrial size was normal in size. Right Atrium: Right atrial size was normal in size. Pericardium: There is no evidence of pericardial effusion. Mitral Valve: The mitral valve is normal in structure. No evidence of mitral valve regurgitation. No evidence of mitral valve stenosis. Tricuspid Valve: The tricuspid  Collection Time: 03/09/23 10:11 PM   Specimen: SPU  Result Value Ref Range Status   Specimen Description SPUTUM  Final   Special Requests NONE Reflexed from [id number]  Final   Gram Stain   Final    ABUNDANT WBC PRESENT,BOTH PMN AND MONONUCLEAR FEW GRAM POSITIVE COCCI IN PAIRS FEW GRAM NEGATIVE RODS    Culture   Final    CULTURE REINCUBATED FOR BETTER GROWTH Performed at [facility], [address], [place], [place] [address]    Report Status PENDING  Incomplete      Radiology Studies: ECHOCARDIOGRAM COMPLETE  Result Date: 03/09/2023    ECHOCARDIOGRAM REPORT   Patient Name:   [name] Date of Exam: 03/09/2023 Medical Rec #:  [id number]             Height:       61.0 in Accession #:    [id number]            Weight:       172.0 lb Date of Birth:  Jan 28, 1956              BSA:          1.771 m Patient Age:    67 years              BP:           136/64 mmHg Patient Gender: F                     HR:           84 bpm. Exam Location:  Inpatient Procedure: 2D Echo, Color Doppler and Cardiac Doppler Indications:    Ventricular Tachycardia  History:        Patient has no prior history of  Echocardiogram examinations.                 CAD, TIA, Arrythmias:SVT; Risk Factors:Diabetes, Hypertension                 and Dyslipidemia.  Sonographer:    Milbert Coulter Referring Phys: [id number] SUBRINA SUNDIL IMPRESSIONS  1. Left ventricular ejection fraction, by estimation, is 60 to 65%. The left ventricle has normal function. The left ventricle has no regional wall motion abnormalities. Left ventricular diastolic parameters were normal.  2. Right ventricular systolic function is normal. The right ventricular size is normal.  3. The mitral valve is normal in structure. No evidence of mitral valve regurgitation. No evidence of mitral stenosis.  4. The aortic valve is grossly normal. Aortic valve regurgitation is not visualized. No aortic stenosis is present.  5. The inferior vena cava is dilated in size with >50% respiratory variability, suggesting right atrial pressure of 8 mmHg. Comparison(s): No prior Echocardiogram. FINDINGS  Left Ventricle: Left ventricular ejection fraction, by estimation, is 60 to 65%. The left ventricle has normal function. The left ventricle has no regional wall motion abnormalities. The left ventricular internal cavity size was normal in size. There is  no left ventricular hypertrophy. Left ventricular diastolic parameters were normal. Right Ventricle: The right ventricular size is normal. No increase in right ventricular wall thickness. Right ventricular systolic function is normal. Left Atrium: Left atrial size was normal in size. Right Atrium: Right atrial size was normal in size. Pericardium: There is no evidence of pericardial effusion. Mitral Valve: The mitral valve is normal in structure. No evidence of mitral valve regurgitation. No evidence of mitral valve stenosis. Tricuspid Valve: The tricuspid  PROGRESS NOTE    [name]  VOZ:[id number] DOB: 11-30-1955 DOA: 03/08/2023 PCP: Jarrett Soho, PA-C   Brief Narrative: [name] is a 67 y.o. female with a history of hypertension, hyperlipidemia, CAD, diabetes mellitus type 2, obesity.  Patient presented secondary to body aches, nausea, vomiting and productive cough and found to have evidence of right lower lobe pneumonia with associated sepsis physiology patient started empirically on antibiotics.  Hospitalization complicated by tachycardia concerning for possible SVT versus atrial fibrillation versus atrial flutter.   Assessment/Plan:  Atrial flutter with RVR Patient initially treated in ED with labetalol IV with resolution of tachycardia. Patient now returned to fast rhythm with heart rates in 150s. EKG likely shows atrial flutter versus SVT. Consulted cardiology. Patient started on diltiazem drip with conversion to normal sinus rhythm. Diltiazem IV transitioned to Cardizem CD 240 mg.  Sepsis Present on admission and secondary to pneumonia. Blood cultures obtained and are pending. Procalcitonin negative. RVP negative. COVID-19 negative. Urinalysis not suggestive of infection.  Right mid and lower lung pneumonia Noted infiltrate noted on imaging. Patient with associated fever and leukocytosis. Sputum culture is pending. Patient initially treated with Ceftriaxone and doxycycline with Ceftriaxone now transitioned to Cefdinir. -Continue Cefdinir and doxycycline -Sputum culture -Flutter valve -Mucinex and Tessalon Perles -Repeat chest x-ray  Acute respiratory failure with hypoxia Secondary to pneumonia. Weaned to room air. -Ambulatory pulse ox prior to discharge  Acute anemia Unclear etiology. Patient is started on anticoagulation. -Repeat CBC in AM  Hypokalemia Potassium of 3.1 on admission. Supplementation given. Recurrent. -Continue potassium supplementation  Hypomagnesemia Magnesium of 1.6 on  admission. Supplementation given. Resolved.  Nausea and vomiting Present on admission. No nausea or vomiting while admitted so far. -Continue Zofran PRN  Hyponatremia Mild. Stable.  Primary hypertension -Continue metoprolol  CAD -Continue Lipitor  Diabetes mellitus type 2 Controlled with hemoglobin A1C of 5.9%. patient is on metformin as an outpatient; metformin held on admission. -Continue SSI  Obesity Estimated body mass index is 33.57 kg/m as calculated from the following:   Height as of this encounter: 5\' 1"  (1.549 m).   Weight as of this encounter: 80.6 kg.   DVT prophylaxis: Lovenox Code Status:   Code Status: Full Code Family Communication: None at bedside Disposition Plan: Discharge likely in 1-2 days if hemoglobin remains stable, in addition to cardiology recommendations  Consultants:  PCCM Cardiology  Procedures:  Transthoracic Echocardiogram (pending)  Antimicrobials: Ceftriaxone Doxycycline    Subjective: Persistent cough that makes her feel miserable. She feels like she is wheezing.  Objective: BP 122/76   Pulse 72   Temp 97.8 F (36.6 C) (Oral)   Resp 17   Ht 5\' 1"  (1.549 m)   Wt 80.6 kg   SpO2 90%   BMI 33.57 kg/m   Examination:  General exam: Appears calm and slightly uncomfortable Respiratory system: Clear to auscultation mostly with mild left lower lobe decreased breath sounds. Respiratory effort normal. Cardiovascular system: S1 & S2 heard, RRR. Gastrointestinal system: Abdomen is nondistended, soft and nontender. Normal bowel sounds heard. Central nervous system: Alert and oriented. No focal neurological deficits. Musculoskeletal: No edema. No calf tenderness Psychiatry: Judgement and insight appear normal. Mood & affect appropriate.    Data Reviewed: I have personally reviewed following labs and imaging studies   Last CBC Lab Results  Component Value Date   WBC 9.6 03/11/2023   HGB 10.0 (L) 03/11/2023   HCT 29.1 (L)  03/11/2023   MCV 80.8 03/11/2023   [facility]  Collection Time: 03/09/23 10:11 PM   Specimen: SPU  Result Value Ref Range Status   Specimen Description SPUTUM  Final   Special Requests NONE Reflexed from [id number]  Final   Gram Stain   Final    ABUNDANT WBC PRESENT,BOTH PMN AND MONONUCLEAR FEW GRAM POSITIVE COCCI IN PAIRS FEW GRAM NEGATIVE RODS    Culture   Final    CULTURE REINCUBATED FOR BETTER GROWTH Performed at [facility], [address], [place], [place] [address]    Report Status PENDING  Incomplete      Radiology Studies: ECHOCARDIOGRAM COMPLETE  Result Date: 03/09/2023    ECHOCARDIOGRAM REPORT   Patient Name:   [name] Date of Exam: 03/09/2023 Medical Rec #:  [id number]             Height:       61.0 in Accession #:    [id number]            Weight:       172.0 lb Date of Birth:  Jan 28, 1956              BSA:          1.771 m Patient Age:    67 years              BP:           136/64 mmHg Patient Gender: F                     HR:           84 bpm. Exam Location:  Inpatient Procedure: 2D Echo, Color Doppler and Cardiac Doppler Indications:    Ventricular Tachycardia  History:        Patient has no prior history of  Echocardiogram examinations.                 CAD, TIA, Arrythmias:SVT; Risk Factors:Diabetes, Hypertension                 and Dyslipidemia.  Sonographer:    Milbert Coulter Referring Phys: [id number] SUBRINA SUNDIL IMPRESSIONS  1. Left ventricular ejection fraction, by estimation, is 60 to 65%. The left ventricle has normal function. The left ventricle has no regional wall motion abnormalities. Left ventricular diastolic parameters were normal.  2. Right ventricular systolic function is normal. The right ventricular size is normal.  3. The mitral valve is normal in structure. No evidence of mitral valve regurgitation. No evidence of mitral stenosis.  4. The aortic valve is grossly normal. Aortic valve regurgitation is not visualized. No aortic stenosis is present.  5. The inferior vena cava is dilated in size with >50% respiratory variability, suggesting right atrial pressure of 8 mmHg. Comparison(s): No prior Echocardiogram. FINDINGS  Left Ventricle: Left ventricular ejection fraction, by estimation, is 60 to 65%. The left ventricle has normal function. The left ventricle has no regional wall motion abnormalities. The left ventricular internal cavity size was normal in size. There is  no left ventricular hypertrophy. Left ventricular diastolic parameters were normal. Right Ventricle: The right ventricular size is normal. No increase in right ventricular wall thickness. Right ventricular systolic function is normal. Left Atrium: Left atrial size was normal in size. Right Atrium: Right atrial size was normal in size. Pericardium: There is no evidence of pericardial effusion. Mitral Valve: The mitral valve is normal in structure. No evidence of mitral valve regurgitation. No evidence of mitral valve stenosis. Tricuspid Valve: The tricuspid

## 2023-03-12 ENCOUNTER — Other Ambulatory Visit (HOSPITAL_COMMUNITY): Payer: Self-pay

## 2023-03-12 DIAGNOSIS — I480 Paroxysmal atrial fibrillation: Secondary | ICD-10-CM | POA: Diagnosis not present

## 2023-03-12 DIAGNOSIS — I4892 Unspecified atrial flutter: Secondary | ICD-10-CM | POA: Diagnosis not present

## 2023-03-12 DIAGNOSIS — I4891 Unspecified atrial fibrillation: Secondary | ICD-10-CM | POA: Insufficient documentation

## 2023-03-12 DIAGNOSIS — J189 Pneumonia, unspecified organism: Secondary | ICD-10-CM | POA: Diagnosis not present

## 2023-03-12 LAB — COMPREHENSIVE METABOLIC PANEL
ALT: 32 U/L (ref 0–44)
AST: 35 U/L (ref 15–41)
Albumin: 2.6 g/dL — ABNORMAL LOW (ref 3.5–5.0)
Alkaline Phosphatase: 67 U/L (ref 38–126)
Anion gap: 8 (ref 5–15)
BUN: <5 mg/dL — ABNORMAL LOW (ref 8–23)
CO2: 21 mmol/L — ABNORMAL LOW (ref 22–32)
Calcium: 7.9 mg/dL — ABNORMAL LOW (ref 8.9–10.3)
Chloride: 102 mmol/L (ref 98–111)
Creatinine, Ser: 0.67 mg/dL (ref 0.44–1.00)
GFR, Estimated: >60 mL/min (ref >60)
Glucose, Bld: 131 mg/dL — ABNORMAL HIGH (ref 70–99)
Potassium: 3.6 mmol/L (ref 3.5–5.1)
Sodium: 131 mmol/L — ABNORMAL LOW (ref 135–145)
Total Bilirubin: 0.8 mg/dL (ref 0.3–1.2)
Total Protein: 5.6 g/dL — ABNORMAL LOW (ref 6.5–8.1)

## 2023-03-12 LAB — CBC
HCT: 28.2 % — ABNORMAL LOW (ref 36.0–46.0)
Hemoglobin: 9.6 g/dL — ABNORMAL LOW (ref 12.0–15.0)
MCH: 27.7 pg (ref 26.0–34.0)
MCHC: 34 g/dL (ref 30.0–36.0)
MCV: 81.3 fL (ref 80.0–100.0)
Platelets: 234 10*3/uL (ref 150–400)
RBC: 3.47 MIL/uL — ABNORMAL LOW (ref 3.87–5.11)
RDW: 13.6 % (ref 11.5–15.5)
WBC: 8.6 10*3/uL (ref 4.0–10.5)
nRBC: 0 % (ref 0.0–0.2)

## 2023-03-12 LAB — GLUCOSE, CAPILLARY
Glucose-Capillary: 119 mg/dL — ABNORMAL HIGH (ref 70–99)
Glucose-Capillary: 191 mg/dL — ABNORMAL HIGH (ref 70–99)

## 2023-03-12 LAB — MAGNESIUM: Magnesium: 1.8 mg/dL (ref 1.7–2.4)

## 2023-03-12 MED ORDER — POTASSIUM CHLORIDE CRYS ER 20 MEQ PO TBCR
40.0000 meq | EXTENDED_RELEASE_TABLET | Freq: Once | ORAL | Status: AC
  Administered 2023-03-12: 40 meq via ORAL
  Filled 2023-03-12: qty 2

## 2023-03-12 MED ORDER — DOXYCYCLINE HYCLATE 100 MG PO TABS
100.0000 mg | ORAL_TABLET | Freq: Two times a day (BID) | ORAL | 0 refills | Status: AC
  Filled 2023-03-12: qty 2, 1d supply, fill #0

## 2023-03-12 MED ORDER — APIXABAN 5 MG PO TABS
5.0000 mg | ORAL_TABLET | Freq: Two times a day (BID) | ORAL | 2 refills | Status: DC
  Filled 2023-03-12: qty 60, 30d supply, fill #0

## 2023-03-12 MED ORDER — CEFDINIR 300 MG PO CAPS
600.0000 mg | ORAL_CAPSULE | Freq: Every day | ORAL | 0 refills | Status: AC
  Filled 2023-03-12: qty 2, 1d supply, fill #0

## 2023-03-12 MED ORDER — AMIODARONE HCL 200 MG PO TABS
ORAL_TABLET | ORAL | 0 refills | Status: DC
  Filled 2023-03-12: qty 50, 35d supply, fill #0

## 2023-03-12 MED ORDER — GUAIFENESIN ER 600 MG PO TB12
600.0000 mg | ORAL_TABLET | Freq: Two times a day (BID) | ORAL | 0 refills | Status: AC
  Filled 2023-03-12: qty 10, 5d supply, fill #0

## 2023-03-12 NOTE — Progress Notes (Signed)
Occupational Therapy Treatment Patient Details Name: [name] MRN: [id number] DOB: [birth year] Today's Date: 03/12/2023   History of present illness Pt is a 67 y.o. female presenting 10/24 with body aches, emesis, productive cough, chills. Found to have  right lower lobe pneumonia with associated sepsis physiology and SVT episodes, depleted electrolytes.  PMH significant for HTN, HLD, DMII.   OT comments  Pt progressing toward established OT goals. Performing functional mobility to restroom and standing for two consecutive grooming tasks. Also donned new brief without need for rest break on RA with VSS. Observing decreased balance with turns but no over LOB. Engaged in therapeutic activity to optimize balance with stepping and reaching tasks. Will continue to follow.       If plan is discharge home, recommend the following:  A little help with walking and/or transfers;A little help with bathing/dressing/bathroom;Assistance with cooking/housework;Help with stairs or ramp for entrance   Equipment Recommendations  Tub/shower seat    Recommendations for Other Services      Precautions / Restrictions Precautions Precautions: Fall Restrictions Weight Bearing Restrictions: No       Mobility Bed Mobility Overal bed mobility: Modified Independent                  Transfers Overall transfer level: Needs assistance Equipment used: None Transfers: Sit to/from Stand Sit to Stand: Supervision           General transfer comment: slow to rise     Balance Overall balance assessment: Needs assistance Sitting-balance support: Feet supported, No upper extremity supported Sitting balance-Leahy Scale: Good     Standing balance support: No upper extremity supported, During functional activity Standing balance-Leahy Scale: Good Standing balance comment: distant supervision dynamically during gait, but needing CGA for exercises that include pivotal steps or reaching  outside of BOS                           ADL either performed or assessed with clinical judgement   ADL Overall ADL's : Needs assistance/impaired     Grooming: Wash/dry hands;Oral care;Supervision/safety;Standing               Lower Body Dressing: Supervision/safety;Sit to/from stand Lower Body Dressing Details (indicate cue type and reason): to don brief Toilet Transfer: Supervision/safety;Ambulation;Regular Toilet           Functional mobility during ADLs: Supervision/safety      Extremity/Trunk Assessment Upper Extremity Assessment Upper Extremity Assessment: Overall WFL for tasks assessed   Lower Extremity Assessment Lower Extremity Assessment: Defer to PT evaluation        Vision       Perception     Praxis      Cognition Arousal: Alert Behavior During Therapy: [facility] for tasks assessed/performed Overall Cognitive Status: Within Functional Limits for tasks assessed                                          Exercises Exercises: Other exercises Other Exercises Other Exercises: Pt observed mildly off balance with pivotal turns during session. Engaged pt in reciprocal and ipsilateral reaching and stepping tasks with pt marching LLE, RLE, then reaching cross body LUE, RUE. Needing mod-max cues for sequence throughout and with notably decr sequencing/coordination. DOwngraded task with insutructions to simulate as of putting up dishes or reaching into closet with pt first stepping then reacing  with opposite arm 10x; tranistioned to reaching with imsilateral arm after step. Pt with greater sucess and only min cues with two step sequence.    Shoulder Instructions       General Comments      Pertinent Vitals/ Pain       Pain Assessment Pain Assessment: Faces Faces Pain Scale: Hurts a little bit Pain Location: back, abdomen Pain Descriptors / Indicators: Aching Pain Intervention(s): Limited activity within patient's tolerance,  Monitored during session  Home Living                                          Prior Functioning/Environment              Frequency  Min 1X/week        Progress Toward Goals  OT Goals(current goals can now be found in the care plan section)  Progress towards OT goals: Progressing toward goals  Acute Rehab OT Goals Patient Stated Goal: get better OT Goal Formulation: With patient Time For Goal Achievement: 03/24/23 Potential to Achieve Goals: Good ADL Goals Pt Will Perform Grooming: with modified independence Pt Will Perform Lower Body Dressing: with modified independence;sit to/from stand Pt Will Transfer to Toilet: with modified independence;ambulating Additional ADL Goal #1: Pt will perform 10+ mins OOB ADL with mod I and no seated rest breaks  Plan      Co-evaluation                 AM-PAC OT "6 Clicks" Daily Activity     Outcome Measure   Help from another person eating meals?: None Help from another person taking care of personal grooming?: A Little Help from another person toileting, which includes using toliet, bedpan, or urinal?: A Little Help from another person bathing (including washing, rinsing, drying)?: A Little Help from another person to put on and taking off regular upper body clothing?: A Little Help from another person to put on and taking off regular lower body clothing?: A Little 6 Click Score: 19    End of Session Equipment Utilized During Treatment: Gait belt  OT Visit Diagnosis: Unsteadiness on feet (R26.81);Muscle weakness (generalized) (M62.81);Other (comment) (decr activity toelrance)   Activity Tolerance Patient tolerated treatment well   Patient Left in bed;with call bell/phone within reach   Nurse Communication Mobility status        Time: 2956-2130 OT Time Calculation (min): 21 min  Charges: OT General Charges $OT Visit: 1 Visit OT Treatments $Therapeutic Exercise: 8-22 mins  Tyler Deis, OTR/L [facility] Acute Rehabilitation Office: [phone]   [name] 03/12/2023, 11:36 AM

## 2023-03-12 NOTE — Plan of Care (Signed)

## 2023-03-12 NOTE — Progress Notes (Signed)
   Rounding Note    Patient Name: [name] Date of Encounter: 03/12/2023  [facility] Cardiologist: Reatha Harps, MD   Subjective   Had episode of afib overnight from about 11 PM to 1:30 AM. Converted to sinus at 1:30 AM and has remained in sinus since. Has been dealing with dry cough but otherwise no complaints.  Inpatient Medications    Scheduled Meds:  amiodarone  400 mg Oral BID   Followed by   Melene Muller ON 03/18/2023] amiodarone  200 mg Oral Daily   apixaban  5 mg Oral BID   cefdinir  600 mg Oral Daily   doxycycline  100 mg Oral Q12H   guaiFENesin  600 mg Oral BID   insulin aspart  0-5 Units Subcutaneous QHS   insulin aspart  0-6 Units Subcutaneous TID WC   pantoprazole  40 mg Oral Daily   rosuvastatin  10 mg Oral Daily   sodium chloride flush  3 mL Intravenous Q12H   Continuous Infusions:  diltiazem (CARDIZEM) infusion 5 mg/hr (03/12/23 0933)   PRN Meds: acetaminophen **OR** acetaminophen, benzonatate, labetalol, levalbuterol, ondansetron **OR** ondansetron (ZOFRAN) IV, sodium chloride flush, triazolam   Vital Signs    Vitals:   03/12/23 0400 03/12/23 0500 03/12/23 0700 03/12/23 1145  BP: 102/64   126/69  Pulse: 74   74  Resp: (!) 21   20  Temp: 98.4 F (36.9 C)  98.6 F (37 C) 98.7 F (37.1 C)  TempSrc: Oral  Oral Oral  SpO2: 96%   98%  Weight:  80.7 kg    Height:        Intake/Output Summary (Last 24 hours) at 03/12/2023 1342 Last data filed at 03/12/2023 0933 Gross per 24 hour  Intake 134.51 ml  Output --  Net 134.51 ml      03/12/2023    5:00 AM 03/11/2023    4:25 AM 03/10/2023    6:07 AM  Last 3 Weights  Weight (lbs) 177 lb 14.6 oz 177 lb 11.1 oz 182 lb 5.1 oz  Weight (kg) 80.7 kg 80.6 kg 82.7 kg      Telemetry    Had episode of afib overnight from about 11 PM to 1:30 AM. Converted to sinus at 1:30 AM and has remained in sinus since. - Personally Reviewed  Physical Exam   GEN: No acute distress.   Neck: No  JVD Cardiac: RRR, no murmurs, rubs, or gallops.  Respiratory: diminished breath sounds bilaterally. GI: Soft, nontender, non-distended  MS: No edema; No deformity. Neuro:  Nonfocal  Psych: Normal affect   New pertinent results (labs, ECG, imaging, cardiac studies)    No new  Patient Profile     67 y.o. female with PMH hypertension, hyperlipidemia admitted with pneumonia, found to have new atrial fibrillation/atrial flutter this admission.  Assessment & Plan    New atrial fibrillation/atrial flutter -chads vasc 3, continue apixzaban 5 mg BID -converted to sinus from flutter on diltiazem, then went into afib. Converted from afib to sinus on amiodarone.  -continue amiodarone oral load while recovering from pneumonia. May not require long term, as the inflammation from the pneumonia was likely the precipitating event. Follow up with Dr. Flora Lipps as outpatient -echo unremarkable -would aim to keep K >4 and Mg >2  Pneumonia -per primary team    Signed, Jodelle Red, MD  03/12/2023, 1:42 PM

## 2023-03-12 NOTE — Care Management Important Message (Signed)
Important Message  Patient Details  Name: [name] MRN: [id number] Date of Birth: 09-Oct-1955   Important Message Given:  Yes - Medicare IM     Dorena Bodo 03/12/2023, 3:05 PM

## 2023-03-12 NOTE — Progress Notes (Signed)
SATURATION QUALIFICATIONS: (This note is used to comply with regulatory documentation for home oxygen)  Patient Saturations on Room Air at Rest = 91%  Patient Saturations on Room Air while Ambulating = 95%

## 2023-03-12 NOTE — TOC Initial Note (Addendum)
Transition of Care [facility]) - Initial/Assessment Note    Patient Details  Name: [name] MRN: [id number] Date of Birth: [birth year]  Transition of Care [facility]) CM/SW Contact:    Leone Haven, RN Phone Number: 03/12/2023, 1:33 PM  Clinical Narrative:                 From home with spouse, has PCP and insurance on file, states has no HH services in place at this time , has a cane at home but does not use it.  Per PT/OT eval no follow up needed.   States spouse will transport her home at [employer] and family is support system, states gets medications from [facility] on [address].  Pta self ambulatory.  Per patient if she needs home oxygen at discharge she has no preference of the agency.  Will continue to monitor to see if needs oxygen at dc.  Per ambulatory sats, she does not qualify for home oxygen.   Expected Discharge Plan: Home/Self Care Barriers to Discharge: Continued Medical Work up   Patient Goals and CMS Choice Patient states their goals for this hospitalization and ongoing recovery are:: return home   Choice offered to / list presented to : NA      Expected Discharge Plan and Services In-house Referral: NA Discharge Planning Services: CM Consult Post Acute Care Choice: NA Living arrangements for the past 2 months: Single Family Home                 DME Arranged: N/A         HH Arranged: NA          Prior Living Arrangements/Services Living arrangements for the past 2 months: Single Family Home Lives with:: Spouse Patient language and need for interpreter reviewed:: Yes Do you feel safe going back to the place where you live?: Yes      Need for Family Participation in Patient Care: Yes (Comment) Care giver support system in place?: Yes (comment) Current home services: DME (cane) Criminal Activity/Legal Involvement Pertinent to Current Situation/Hospitalization: No - Comment as needed  Activities of Daily Living   ADL Screening (condition at  time of admission) Independently performs ADLs?: Yes (appropriate for developmental age) Is the patient deaf or have difficulty hearing?: No Does the patient have difficulty seeing, even when wearing glasses/contacts?: No Does the patient have difficulty concentrating, remembering, or making decisions?: No  Permission Sought/Granted Permission sought to share information with : Case Manager Permission granted to share information with : Yes, Verbal Permission Granted              Emotional Assessment Appearance:: Appears stated age Attitude/Demeanor/Rapport: Engaged Affect (typically observed): Appropriate Orientation: : Oriented to Self, Oriented to Place, Oriented to  Time, Oriented to Situation Alcohol / Substance Use: Not Applicable Psych Involvement: No (comment)  Admission diagnosis:  Palpitations [R00.2] SVT (supraventricular tachycardia) (HCC) [I47.10] Tachycardia [R00.0] Irregular heart rhythm [I49.9] Upper respiratory tract infection, unspecified type [J06.9] Community acquired pneumonia, unspecified laterality [J18.9] Nausea and vomiting, unspecified vomiting type [R11.2] Patient Active Problem List   Diagnosis Date Noted   CAP (community acquired pneumonia) 03/09/2023   Hypomagnesemia 03/09/2023   Acute hypoxic respiratory failure (HCC) 03/09/2023   History of CAD (coronary artery disease) 03/09/2023   SVT (supraventricular tachycardia) (HCC) 03/09/2023   Acute hyponatremia 04/15/2022   COVID-19 virus infection 04/15/2022   Non-insulin dependent type 2 diabetes mellitus (HCC) 04/15/2022   Weakness 08/10/2021   Acute pyelonephritis 08/09/2021  AKI (acute kidney injury) (HCC) 08/09/2021   Hypokalemia 08/09/2021   Hyponatremia 08/09/2021   Generalized weakness 08/08/2021   TIA (transient ischemic attack) 03/03/2017   Leukocytosis 01/10/2017   Right sided weakness 01/09/2017   Chest pain, rule out acute myocardial infarction 01/09/2016   CLOSED FRACTURE OF  UNSPECIFIED PART OF FIBULA 02/15/2010   FACIAL RASH 01/26/2010   COUGH 01/26/2010   Nausea & vomiting 01/26/2010   MOTION SICKNESS 12/08/2009   CHOLECYSTECTOMY, LAPAROSCOPIC, HX OF 12/08/2009   CHOLELITHIASIS 10/20/2009   LIVER FUNCTION TESTS, ABNORMAL, HX OF 10/03/2009   VIRAL URI 09/23/2009   SEBORRHEA 09/23/2009   SHORTNESS OF BREATH 09/23/2009   INSOMNIA 07/21/2009   EDEMA 07/21/2009   ACUTE FRONTAL SINUSITIS 06/10/2009   WEIGHT GAIN 06/10/2009   ACUTE BRONCHITIS 05/27/2009   ACTINIC KERATOSIS 03/29/2009   VITAMIN D DEFICIENCY 02/23/2009   Depression 02/23/2009   ALLERGIC RHINITIS 02/23/2009   CONSTIPATION 02/23/2009   IRRITABLE BOWEL SYNDROME 02/23/2009   BENIGN POSITIONAL VERTIGO, HX OF 02/12/2009   HLD (hyperlipidemia) 12/29/2008   Essential hypertension 12/29/2008   GERD 12/29/2008   TOBACCO USE, QUIT 12/29/2008   PCP:  Jarrett Soho, PA-C Pharmacy:   [facility] [id number] - [place], [place] - [address] [place] [address] Phone: [phone] Fax: [phone]  [facility] [address] [place] [address] Phone: [phone] Fax: [phone]  [facility] [address] [place] [address] Phone: [phone] Fax: [phone]     Social Determinants of Health (SDOH) Social History: SDOH Screenings   Food Insecurity: No Food Insecurity (03/09/2023)  Housing: Low Risk  (03/09/2023)  Transportation Needs: No Transportation Needs (03/09/2023)  Utilities: Not At Risk (03/09/2023)  Tobacco Use: Medium Risk (03/09/2023)   SDOH Interventions:     Readmission Risk Interventions     No data to display

## 2023-03-12 NOTE — Significant Event (Signed)
Called by RN: pt having HR of 150 again (presumably a.fib or a.flutter 2:1 once again).  BP 128/70.  RN getting EKG on pt now. Ordered cardizem gtt since that seemed to convert her out of this previously, no bolus since she's already on Cardizem CD 240mg  daily

## 2023-03-12 NOTE — Discharge Summary (Signed)
Physician Discharge Summary   Patient: [name] MRN: [id number] DOB: 05-01-1956  Admit date:     03/08/2023  Discharge date: 03/12/23  Discharge Physician: Jacquelin Hawking, MD   PCP: Jarrett Soho, PA-C   Recommendations at discharge:  PCP follow-up Cardiology follow-up Recheck CBC and BMP in 3-5 days  Discharge Diagnoses: Principal Problem:   Atrial flutter with rapid ventricular response (HCC) Active Problems:   CAP (community acquired pneumonia)   Essential hypertension   Nausea & vomiting   Hypokalemia   Hyponatremia   Non-insulin dependent type 2 diabetes mellitus (HCC)   Hypomagnesemia   Acute hypoxic respiratory failure (HCC)   History of CAD (coronary artery disease)   Atrial fibrillation with rapid ventricular response [facility])  Resolved Problems:   Ventricular arrhythmia  Hospital Course: [name] is a 67 y.o. female with a history of hypertension, hyperlipidemia, CAD, diabetes mellitus type 2, obesity.  Patient presented secondary to body aches, nausea, vomiting and productive cough and found to have evidence of right lower lobe pneumonia with associated sepsis physiology patient started empirically on antibiotics.  Hospitalization complicated by tachycardia concerning for possible SVT versus atrial fibrillation versus atrial flutter. Cardiology consulted. Patient managed on diltiazem and amiodarone added with conversion back into sinus rhythm.   Assessment and Plan:  Atrial flutter/fibrillation with RVR Patient initially treated in ED with labetalol IV with resolution of tachycardia. Patient now returned to fast rhythm with heart rates in 150s. EKG likely shows atrial flutter versus SVT. Consulted cardiology. Patient started on diltiazem drip with conversion to normal sinus rhythm. Diltiazem IV transitioned to Cardizem CD 240 mg. Amiodarone started with load. Diltiazem IV restarted overnight for conversion into atrial fibrillation and  discontinued prior to discharge. Cardiology recommendations to continue diltiazem PO and amiodarone load followed by daily dosing. Patient to follow-up with cardiology as an outpatient.  Sepsis Present on admission and secondary to pneumonia. Blood cultures obtained and are pending. Procalcitonin negative. RVP negative. COVID-19 negative. Urinalysis not suggestive of infection. Physiology improved.  Right mid and lower lung pneumonia Noted infiltrate noted on imaging. Patient with associated fever and leukocytosis. Sputum culture is pending. Patient initially treated with Ceftriaxone and doxycycline with Ceftriaxone now transitioned to Cefdinir. Patient completed a 5 day course of antibiotics.  Acute respiratory failure with hypoxia Secondary to pneumonia. Weaned to room air. Patient not qualifying for supplemental oxygen prior to discharge.  Acute anemia Unclear etiology. Patient is started on anticoagulation. Hemoglobin appears to have stabilized.  Hypokalemia Potassium of 3.1 on admission. Improved with supplementation.  Hypomagnesemia Magnesium of 1.6 on admission. Supplementation given. Resolved.  Nausea and vomiting Present on admission. Resolved.  Hyponatremia Mild. Stable.  Primary hypertension Metoprolol discontinued.   CAD Continue Lipitor  Diabetes mellitus type 2 Controlled with hemoglobin A1C of 5.9%. patient is on metformin as an outpatient; metformin held on admission. Continue regimen on discharge  Obesity Estimated body mass index is 33.62 kg/m as calculated from the following:   Height as of this encounter: 5\' 1"  (1.549 m).   Weight as of this encounter: 80.7 kg.   Consultants:  PCCM Cardiology   Procedures:  Transthoracic Echocardiogram  Disposition: Home Diet recommendation: Cardiac and Carb modified diet   DISCHARGE MEDICATION: Allergies as of 03/12/2023       Reactions   Codeine Nausea And Vomiting   Molnupiravir Diarrhea         Medication List     STOP taking these medications    furosemide 20 MG  Physician Discharge Summary   Patient: [name] MRN: [id number] DOB: 05-01-1956  Admit date:     03/08/2023  Discharge date: 03/12/23  Discharge Physician: Jacquelin Hawking, MD   PCP: Jarrett Soho, PA-C   Recommendations at discharge:  PCP follow-up Cardiology follow-up Recheck CBC and BMP in 3-5 days  Discharge Diagnoses: Principal Problem:   Atrial flutter with rapid ventricular response (HCC) Active Problems:   CAP (community acquired pneumonia)   Essential hypertension   Nausea & vomiting   Hypokalemia   Hyponatremia   Non-insulin dependent type 2 diabetes mellitus (HCC)   Hypomagnesemia   Acute hypoxic respiratory failure (HCC)   History of CAD (coronary artery disease)   Atrial fibrillation with rapid ventricular response [facility])  Resolved Problems:   Ventricular arrhythmia  Hospital Course: [name] is a 67 y.o. female with a history of hypertension, hyperlipidemia, CAD, diabetes mellitus type 2, obesity.  Patient presented secondary to body aches, nausea, vomiting and productive cough and found to have evidence of right lower lobe pneumonia with associated sepsis physiology patient started empirically on antibiotics.  Hospitalization complicated by tachycardia concerning for possible SVT versus atrial fibrillation versus atrial flutter. Cardiology consulted. Patient managed on diltiazem and amiodarone added with conversion back into sinus rhythm.   Assessment and Plan:  Atrial flutter/fibrillation with RVR Patient initially treated in ED with labetalol IV with resolution of tachycardia. Patient now returned to fast rhythm with heart rates in 150s. EKG likely shows atrial flutter versus SVT. Consulted cardiology. Patient started on diltiazem drip with conversion to normal sinus rhythm. Diltiazem IV transitioned to Cardizem CD 240 mg. Amiodarone started with load. Diltiazem IV restarted overnight for conversion into atrial fibrillation and  discontinued prior to discharge. Cardiology recommendations to continue diltiazem PO and amiodarone load followed by daily dosing. Patient to follow-up with cardiology as an outpatient.  Sepsis Present on admission and secondary to pneumonia. Blood cultures obtained and are pending. Procalcitonin negative. RVP negative. COVID-19 negative. Urinalysis not suggestive of infection. Physiology improved.  Right mid and lower lung pneumonia Noted infiltrate noted on imaging. Patient with associated fever and leukocytosis. Sputum culture is pending. Patient initially treated with Ceftriaxone and doxycycline with Ceftriaxone now transitioned to Cefdinir. Patient completed a 5 day course of antibiotics.  Acute respiratory failure with hypoxia Secondary to pneumonia. Weaned to room air. Patient not qualifying for supplemental oxygen prior to discharge.  Acute anemia Unclear etiology. Patient is started on anticoagulation. Hemoglobin appears to have stabilized.  Hypokalemia Potassium of 3.1 on admission. Improved with supplementation.  Hypomagnesemia Magnesium of 1.6 on admission. Supplementation given. Resolved.  Nausea and vomiting Present on admission. Resolved.  Hyponatremia Mild. Stable.  Primary hypertension Metoprolol discontinued.   CAD Continue Lipitor  Diabetes mellitus type 2 Controlled with hemoglobin A1C of 5.9%. patient is on metformin as an outpatient; metformin held on admission. Continue regimen on discharge  Obesity Estimated body mass index is 33.62 kg/m as calculated from the following:   Height as of this encounter: 5\' 1"  (1.549 m).   Weight as of this encounter: 80.7 kg.   Consultants:  PCCM Cardiology   Procedures:  Transthoracic Echocardiogram  Disposition: Home Diet recommendation: Cardiac and Carb modified diet   DISCHARGE MEDICATION: Allergies as of 03/12/2023       Reactions   Codeine Nausea And Vomiting   Molnupiravir Diarrhea         Medication List     STOP taking these medications    furosemide 20 MG  Physician Discharge Summary   Patient: [name] MRN: [id number] DOB: 05-01-1956  Admit date:     03/08/2023  Discharge date: 03/12/23  Discharge Physician: Jacquelin Hawking, MD   PCP: Jarrett Soho, PA-C   Recommendations at discharge:  PCP follow-up Cardiology follow-up Recheck CBC and BMP in 3-5 days  Discharge Diagnoses: Principal Problem:   Atrial flutter with rapid ventricular response (HCC) Active Problems:   CAP (community acquired pneumonia)   Essential hypertension   Nausea & vomiting   Hypokalemia   Hyponatremia   Non-insulin dependent type 2 diabetes mellitus (HCC)   Hypomagnesemia   Acute hypoxic respiratory failure (HCC)   History of CAD (coronary artery disease)   Atrial fibrillation with rapid ventricular response [facility])  Resolved Problems:   Ventricular arrhythmia  Hospital Course: [name] is a 67 y.o. female with a history of hypertension, hyperlipidemia, CAD, diabetes mellitus type 2, obesity.  Patient presented secondary to body aches, nausea, vomiting and productive cough and found to have evidence of right lower lobe pneumonia with associated sepsis physiology patient started empirically on antibiotics.  Hospitalization complicated by tachycardia concerning for possible SVT versus atrial fibrillation versus atrial flutter. Cardiology consulted. Patient managed on diltiazem and amiodarone added with conversion back into sinus rhythm.   Assessment and Plan:  Atrial flutter/fibrillation with RVR Patient initially treated in ED with labetalol IV with resolution of tachycardia. Patient now returned to fast rhythm with heart rates in 150s. EKG likely shows atrial flutter versus SVT. Consulted cardiology. Patient started on diltiazem drip with conversion to normal sinus rhythm. Diltiazem IV transitioned to Cardizem CD 240 mg. Amiodarone started with load. Diltiazem IV restarted overnight for conversion into atrial fibrillation and  discontinued prior to discharge. Cardiology recommendations to continue diltiazem PO and amiodarone load followed by daily dosing. Patient to follow-up with cardiology as an outpatient.  Sepsis Present on admission and secondary to pneumonia. Blood cultures obtained and are pending. Procalcitonin negative. RVP negative. COVID-19 negative. Urinalysis not suggestive of infection. Physiology improved.  Right mid and lower lung pneumonia Noted infiltrate noted on imaging. Patient with associated fever and leukocytosis. Sputum culture is pending. Patient initially treated with Ceftriaxone and doxycycline with Ceftriaxone now transitioned to Cefdinir. Patient completed a 5 day course of antibiotics.  Acute respiratory failure with hypoxia Secondary to pneumonia. Weaned to room air. Patient not qualifying for supplemental oxygen prior to discharge.  Acute anemia Unclear etiology. Patient is started on anticoagulation. Hemoglobin appears to have stabilized.  Hypokalemia Potassium of 3.1 on admission. Improved with supplementation.  Hypomagnesemia Magnesium of 1.6 on admission. Supplementation given. Resolved.  Nausea and vomiting Present on admission. Resolved.  Hyponatremia Mild. Stable.  Primary hypertension Metoprolol discontinued.   CAD Continue Lipitor  Diabetes mellitus type 2 Controlled with hemoglobin A1C of 5.9%. patient is on metformin as an outpatient; metformin held on admission. Continue regimen on discharge  Obesity Estimated body mass index is 33.62 kg/m as calculated from the following:   Height as of this encounter: 5\' 1"  (1.549 m).   Weight as of this encounter: 80.7 kg.   Consultants:  PCCM Cardiology   Procedures:  Transthoracic Echocardiogram  Disposition: Home Diet recommendation: Cardiac and Carb modified diet   DISCHARGE MEDICATION: Allergies as of 03/12/2023       Reactions   Codeine Nausea And Vomiting   Molnupiravir Diarrhea         Medication List     STOP taking these medications    furosemide 20 MG  Physician Discharge Summary   Patient: [name] MRN: [id number] DOB: 05-01-1956  Admit date:     03/08/2023  Discharge date: 03/12/23  Discharge Physician: Jacquelin Hawking, MD   PCP: Jarrett Soho, PA-C   Recommendations at discharge:  PCP follow-up Cardiology follow-up Recheck CBC and BMP in 3-5 days  Discharge Diagnoses: Principal Problem:   Atrial flutter with rapid ventricular response (HCC) Active Problems:   CAP (community acquired pneumonia)   Essential hypertension   Nausea & vomiting   Hypokalemia   Hyponatremia   Non-insulin dependent type 2 diabetes mellitus (HCC)   Hypomagnesemia   Acute hypoxic respiratory failure (HCC)   History of CAD (coronary artery disease)   Atrial fibrillation with rapid ventricular response [facility])  Resolved Problems:   Ventricular arrhythmia  Hospital Course: [name] is a 67 y.o. female with a history of hypertension, hyperlipidemia, CAD, diabetes mellitus type 2, obesity.  Patient presented secondary to body aches, nausea, vomiting and productive cough and found to have evidence of right lower lobe pneumonia with associated sepsis physiology patient started empirically on antibiotics.  Hospitalization complicated by tachycardia concerning for possible SVT versus atrial fibrillation versus atrial flutter. Cardiology consulted. Patient managed on diltiazem and amiodarone added with conversion back into sinus rhythm.   Assessment and Plan:  Atrial flutter/fibrillation with RVR Patient initially treated in ED with labetalol IV with resolution of tachycardia. Patient now returned to fast rhythm with heart rates in 150s. EKG likely shows atrial flutter versus SVT. Consulted cardiology. Patient started on diltiazem drip with conversion to normal sinus rhythm. Diltiazem IV transitioned to Cardizem CD 240 mg. Amiodarone started with load. Diltiazem IV restarted overnight for conversion into atrial fibrillation and  discontinued prior to discharge. Cardiology recommendations to continue diltiazem PO and amiodarone load followed by daily dosing. Patient to follow-up with cardiology as an outpatient.  Sepsis Present on admission and secondary to pneumonia. Blood cultures obtained and are pending. Procalcitonin negative. RVP negative. COVID-19 negative. Urinalysis not suggestive of infection. Physiology improved.  Right mid and lower lung pneumonia Noted infiltrate noted on imaging. Patient with associated fever and leukocytosis. Sputum culture is pending. Patient initially treated with Ceftriaxone and doxycycline with Ceftriaxone now transitioned to Cefdinir. Patient completed a 5 day course of antibiotics.  Acute respiratory failure with hypoxia Secondary to pneumonia. Weaned to room air. Patient not qualifying for supplemental oxygen prior to discharge.  Acute anemia Unclear etiology. Patient is started on anticoagulation. Hemoglobin appears to have stabilized.  Hypokalemia Potassium of 3.1 on admission. Improved with supplementation.  Hypomagnesemia Magnesium of 1.6 on admission. Supplementation given. Resolved.  Nausea and vomiting Present on admission. Resolved.  Hyponatremia Mild. Stable.  Primary hypertension Metoprolol discontinued.   CAD Continue Lipitor  Diabetes mellitus type 2 Controlled with hemoglobin A1C of 5.9%. patient is on metformin as an outpatient; metformin held on admission. Continue regimen on discharge  Obesity Estimated body mass index is 33.62 kg/m as calculated from the following:   Height as of this encounter: 5\' 1"  (1.549 m).   Weight as of this encounter: 80.7 kg.   Consultants:  PCCM Cardiology   Procedures:  Transthoracic Echocardiogram  Disposition: Home Diet recommendation: Cardiac and Carb modified diet   DISCHARGE MEDICATION: Allergies as of 03/12/2023       Reactions   Codeine Nausea And Vomiting   Molnupiravir Diarrhea         Medication List     STOP taking these medications    furosemide 20 MG  Physician Discharge Summary   Patient: [name] MRN: [id number] DOB: 05-01-1956  Admit date:     03/08/2023  Discharge date: 03/12/23  Discharge Physician: Jacquelin Hawking, MD   PCP: Jarrett Soho, PA-C   Recommendations at discharge:  PCP follow-up Cardiology follow-up Recheck CBC and BMP in 3-5 days  Discharge Diagnoses: Principal Problem:   Atrial flutter with rapid ventricular response (HCC) Active Problems:   CAP (community acquired pneumonia)   Essential hypertension   Nausea & vomiting   Hypokalemia   Hyponatremia   Non-insulin dependent type 2 diabetes mellitus (HCC)   Hypomagnesemia   Acute hypoxic respiratory failure (HCC)   History of CAD (coronary artery disease)   Atrial fibrillation with rapid ventricular response [facility])  Resolved Problems:   Ventricular arrhythmia  Hospital Course: [name] is a 67 y.o. female with a history of hypertension, hyperlipidemia, CAD, diabetes mellitus type 2, obesity.  Patient presented secondary to body aches, nausea, vomiting and productive cough and found to have evidence of right lower lobe pneumonia with associated sepsis physiology patient started empirically on antibiotics.  Hospitalization complicated by tachycardia concerning for possible SVT versus atrial fibrillation versus atrial flutter. Cardiology consulted. Patient managed on diltiazem and amiodarone added with conversion back into sinus rhythm.   Assessment and Plan:  Atrial flutter/fibrillation with RVR Patient initially treated in ED with labetalol IV with resolution of tachycardia. Patient now returned to fast rhythm with heart rates in 150s. EKG likely shows atrial flutter versus SVT. Consulted cardiology. Patient started on diltiazem drip with conversion to normal sinus rhythm. Diltiazem IV transitioned to Cardizem CD 240 mg. Amiodarone started with load. Diltiazem IV restarted overnight for conversion into atrial fibrillation and  discontinued prior to discharge. Cardiology recommendations to continue diltiazem PO and amiodarone load followed by daily dosing. Patient to follow-up with cardiology as an outpatient.  Sepsis Present on admission and secondary to pneumonia. Blood cultures obtained and are pending. Procalcitonin negative. RVP negative. COVID-19 negative. Urinalysis not suggestive of infection. Physiology improved.  Right mid and lower lung pneumonia Noted infiltrate noted on imaging. Patient with associated fever and leukocytosis. Sputum culture is pending. Patient initially treated with Ceftriaxone and doxycycline with Ceftriaxone now transitioned to Cefdinir. Patient completed a 5 day course of antibiotics.  Acute respiratory failure with hypoxia Secondary to pneumonia. Weaned to room air. Patient not qualifying for supplemental oxygen prior to discharge.  Acute anemia Unclear etiology. Patient is started on anticoagulation. Hemoglobin appears to have stabilized.  Hypokalemia Potassium of 3.1 on admission. Improved with supplementation.  Hypomagnesemia Magnesium of 1.6 on admission. Supplementation given. Resolved.  Nausea and vomiting Present on admission. Resolved.  Hyponatremia Mild. Stable.  Primary hypertension Metoprolol discontinued.   CAD Continue Lipitor  Diabetes mellitus type 2 Controlled with hemoglobin A1C of 5.9%. patient is on metformin as an outpatient; metformin held on admission. Continue regimen on discharge  Obesity Estimated body mass index is 33.62 kg/m as calculated from the following:   Height as of this encounter: 5\' 1"  (1.549 m).   Weight as of this encounter: 80.7 kg.   Consultants:  PCCM Cardiology   Procedures:  Transthoracic Echocardiogram  Disposition: Home Diet recommendation: Cardiac and Carb modified diet   DISCHARGE MEDICATION: Allergies as of 03/12/2023       Reactions   Codeine Nausea And Vomiting   Molnupiravir Diarrhea         Medication List     STOP taking these medications    furosemide 20 MG  results of significant diagnostics from this hospitalization (including imaging, microbiology, ancillary and laboratory) are listed below for reference.   Imaging Studies: DG CHEST PORT 1 VIEW  Result Date: 03/11/2023 CLINICAL DATA:  Pneumonia. EXAM: PORTABLE CHEST 1 VIEW COMPARISON:  Chest radiograph dated 03/08/2023. FINDINGS: The heart size and mediastinal contours are within normal limits. Mild patchy bilateral interstitial and airspace opacities appear similar to prior exam. No pleural effusion or pneumothorax. The visualized skeletal structures are unremarkable.  IMPRESSION: Mild patchy bilateral interstitial and airspace opacities appear similar to prior exam. Electronically Signed   By: Romona Curls M.D.   On: 03/11/2023 15:39   ECHOCARDIOGRAM COMPLETE  Result Date: 03/09/2023    ECHOCARDIOGRAM REPORT   Patient Name:   REHANNA WILLBORN Date of Exam: 03/09/2023 Medical Rec #:  [id number]             Height:       61.0 in Accession #:    [id number]            Weight:       172.0 lb Date of Birth:  1955/12/29              BSA:          1.771 m Patient Age:    67 years              BP:           136/64 mmHg Patient Gender: F                     HR:           84 bpm. Exam Location:  Inpatient Procedure: 2D Echo, Color Doppler and Cardiac Doppler Indications:    Ventricular Tachycardia  History:        Patient has no prior history of Echocardiogram examinations.                 CAD, TIA, Arrythmias:SVT; Risk Factors:Diabetes, Hypertension                 and Dyslipidemia.  Sonographer:    Milbert Coulter Referring Phys: [id number] SUBRINA SUNDIL IMPRESSIONS  1. Left ventricular ejection fraction, by estimation, is 60 to 65%. The left ventricle has normal function. The left ventricle has no regional wall motion abnormalities. Left ventricular diastolic parameters were normal.  2. Right ventricular systolic function is normal. The right ventricular size is normal.  3. The mitral valve is normal in structure. No evidence of mitral valve regurgitation. No evidence of mitral stenosis.  4. The aortic valve is grossly normal. Aortic valve regurgitation is not visualized. No aortic stenosis is present.  5. The inferior vena cava is dilated in size with >50% respiratory variability, suggesting right atrial pressure of 8 mmHg. Comparison(s): No prior Echocardiogram. FINDINGS  Left Ventricle: Left ventricular ejection fraction, by estimation, is 60 to 65%. The left ventricle has normal function. The left ventricle has no regional wall motion abnormalities. The left ventricular internal  cavity size was normal in size. There is  no left ventricular hypertrophy. Left ventricular diastolic parameters were normal. Right Ventricle: The right ventricular size is normal. No increase in right ventricular wall thickness. Right ventricular systolic function is normal. Left Atrium: Left atrial size was normal in size. Right Atrium: Right atrial size was normal in size. Pericardium: There is no evidence of pericardial effusion. Mitral Valve: The mitral valve is normal in structure. No evidence of mitral valve regurgitation. No  Physician Discharge Summary   Patient: [name] MRN: [id number] DOB: 05-01-1956  Admit date:     03/08/2023  Discharge date: 03/12/23  Discharge Physician: Jacquelin Hawking, MD   PCP: Jarrett Soho, PA-C   Recommendations at discharge:  PCP follow-up Cardiology follow-up Recheck CBC and BMP in 3-5 days  Discharge Diagnoses: Principal Problem:   Atrial flutter with rapid ventricular response (HCC) Active Problems:   CAP (community acquired pneumonia)   Essential hypertension   Nausea & vomiting   Hypokalemia   Hyponatremia   Non-insulin dependent type 2 diabetes mellitus (HCC)   Hypomagnesemia   Acute hypoxic respiratory failure (HCC)   History of CAD (coronary artery disease)   Atrial fibrillation with rapid ventricular response [facility])  Resolved Problems:   Ventricular arrhythmia  Hospital Course: [name] is a 67 y.o. female with a history of hypertension, hyperlipidemia, CAD, diabetes mellitus type 2, obesity.  Patient presented secondary to body aches, nausea, vomiting and productive cough and found to have evidence of right lower lobe pneumonia with associated sepsis physiology patient started empirically on antibiotics.  Hospitalization complicated by tachycardia concerning for possible SVT versus atrial fibrillation versus atrial flutter. Cardiology consulted. Patient managed on diltiazem and amiodarone added with conversion back into sinus rhythm.   Assessment and Plan:  Atrial flutter/fibrillation with RVR Patient initially treated in ED with labetalol IV with resolution of tachycardia. Patient now returned to fast rhythm with heart rates in 150s. EKG likely shows atrial flutter versus SVT. Consulted cardiology. Patient started on diltiazem drip with conversion to normal sinus rhythm. Diltiazem IV transitioned to Cardizem CD 240 mg. Amiodarone started with load. Diltiazem IV restarted overnight for conversion into atrial fibrillation and  discontinued prior to discharge. Cardiology recommendations to continue diltiazem PO and amiodarone load followed by daily dosing. Patient to follow-up with cardiology as an outpatient.  Sepsis Present on admission and secondary to pneumonia. Blood cultures obtained and are pending. Procalcitonin negative. RVP negative. COVID-19 negative. Urinalysis not suggestive of infection. Physiology improved.  Right mid and lower lung pneumonia Noted infiltrate noted on imaging. Patient with associated fever and leukocytosis. Sputum culture is pending. Patient initially treated with Ceftriaxone and doxycycline with Ceftriaxone now transitioned to Cefdinir. Patient completed a 5 day course of antibiotics.  Acute respiratory failure with hypoxia Secondary to pneumonia. Weaned to room air. Patient not qualifying for supplemental oxygen prior to discharge.  Acute anemia Unclear etiology. Patient is started on anticoagulation. Hemoglobin appears to have stabilized.  Hypokalemia Potassium of 3.1 on admission. Improved with supplementation.  Hypomagnesemia Magnesium of 1.6 on admission. Supplementation given. Resolved.  Nausea and vomiting Present on admission. Resolved.  Hyponatremia Mild. Stable.  Primary hypertension Metoprolol discontinued.   CAD Continue Lipitor  Diabetes mellitus type 2 Controlled with hemoglobin A1C of 5.9%. patient is on metformin as an outpatient; metformin held on admission. Continue regimen on discharge  Obesity Estimated body mass index is 33.62 kg/m as calculated from the following:   Height as of this encounter: 5\' 1"  (1.549 m).   Weight as of this encounter: 80.7 kg.   Consultants:  PCCM Cardiology   Procedures:  Transthoracic Echocardiogram  Disposition: Home Diet recommendation: Cardiac and Carb modified diet   DISCHARGE MEDICATION: Allergies as of 03/12/2023       Reactions   Codeine Nausea And Vomiting   Molnupiravir Diarrhea         Medication List     STOP taking these medications    furosemide 20 MG

## 2023-03-12 NOTE — TOC Transition Note (Addendum)
Transition of Care [facility]) - CM/SW Discharge Note   Patient Details  Name: [name] MRN: [id number] Date of Birth: [birth year]  Transition of Care [facility]) CM/SW Contact:  Leone Haven, RN Phone Number: 03/12/2023, 3:35 PM   Clinical Narrative:    Patient is for dc today, she has no needs, she has transport. She is on eliquis, per the [facility] pharmacy, her copay for eliquis refills is 47.00.   Final next level of care: Home/Self Care Barriers to Discharge: Continued Medical Work up   Patient Goals and CMS Choice   Choice offered to / list presented to : NA  Discharge Placement                         Discharge Plan and Services Additional resources added to the After Visit Summary for   In-house Referral: NA Discharge Planning Services: CM Consult Post Acute Care Choice: NA          DME Arranged: N/A         HH Arranged: NA          Social Determinants of Health (SDOH) Interventions SDOH Screenings   Food Insecurity: No Food Insecurity (03/09/2023)  Housing: Low Risk  (03/09/2023)  Transportation Needs: No Transportation Needs (03/09/2023)  Utilities: Not At Risk (03/09/2023)  Tobacco Use: Medium Risk (03/09/2023)     Readmission Risk Interventions     No data to display

## 2023-03-12 NOTE — Progress Notes (Signed)
Patient went back into AFib with RVR with rates between 130's and 160's. BP 128/70. 92% on room air. Patient reported some palpitations. Hospitalist notified and Cardizem drip restarted.

## 2023-03-12 NOTE — Progress Notes (Signed)
Chaplain responded to a consult request for Advance Directive education.   Chaplain provided the Advance Directive packet as well as education on Advance Directives-documents an individual completes to communicate their health care directions in advance of a time when they may need them. Chaplain informed pt the documents which may be completed here in the hospital are the Living Will and Health Care Power of [place].   Chaplain informed that the Health Care Power of [place] is a legal document in which an individual names another person, their Health Care Agent, to make health care decisions when the individual is not able to make them for themselves. The Health Care Agent's function can be temporary or permanent depending on the pt's ability to make and communicate those decisions independently. Chaplain informed pt in the absence of a Health Care Power of [place], the state of [place] directs health care providers to look to the following individuals in the order listed: legal guardian; an attorney?in?fact under a general power of attorney (POA) if that POA includes the right to make health care decisions; a husband or wife; a majority of parents and adult children; a majority of adult brothers and sisters; or an individual who has an established relationship with you, who is acting in good faith and who can convey your wishes.  If none of these person are available or willing to make medical decisions on a patient's behalf, the law allows the patient's doctor to make decisions for them as long as another doctor agrees with those decisions.  Chaplain also informed the patient that the Health Care agent has no decision-making authority over any affairs other than those related to his or her medical care.   The chaplain further educated the pt that a Living Will is a legal document that allows an individual to state his or her desire not to receive life-prolonging measures in the event that they  have a condition that is incurable and will result in their death in a short period of time; they are unconscious, and doctors are confident that they will not regain consciousness; and/or they have advanced dementia or other substantial and irreversible loss of mental function. The chaplain informed pt that life-prolonging measures are medical treatments that would only serve to postpone death, including breathing machines, kidney dialysis, antibiotics, artificial nutrition and hydration (tube feeding), and similar forms of treatment and that if an individual is able to express their wishes, they may also make them known without the use of a Living Will, but in the event that an individual is not able to express their wishes themselves, a Living Will allows medical providers and the pt's family and friends ensure that they are not making decisions on the pt's behalf, but rather serving as the pt's voice to convey decisions the pt has already made.   The patient is aware that the decision to create an advance directive is theirs alone and they may chose not to complete the documents or may chose to complete one portion or both.  The patient was informed that they can revoke the documents at any time by striking through them and writing void or by completing new documents, but that it is also advisable that the individual verbally notify interested parties that their wishes have changed.  They are also aware that the document must be signed in the presence of a notary public and two witnesses and that this can be done while the patient is still admitted to the hospital or  after discharge in the community. If they decide to complete Advance Directives after being discharged from the hospital, they have been advised to notify all interested parties and to provide those documents to their physicians and loved ones in addition to bringing them to the hospital in the event of another hospitalization.   The chaplain  informed the pt that if they desire to proceed with completing Advance Directive Documentation while they are still admitted, notary services are typically available at [facility] between the hours of 1:00 and 3:30 Monday-Thursday.    When the patient is ready to have these documents completed, the patient should request that their nurse place a spiritual care consult and indicate that the patient is ready to have their advance directives notarized so that arrangements for witnesses and notary public can be made.  Please page spiritual care if the patient desires further education or has questions.

## 2023-03-12 NOTE — Progress Notes (Signed)
Mobility Specialist Progress Note:   03/12/23 0900  Mobility  Activity Ambulated independently in hallway  Level of Assistance Standby assist, set-up cues, supervision of patient - no hands on  Assistive Device None  Distance Ambulated (ft) 250 ft  Activity Response Tolerated well  Mobility Referral Yes  $Mobility charge 1 Mobility  Mobility Specialist Start Time (ACUTE ONLY) [phone]  Mobility Specialist Stop Time (ACUTE ONLY) 0933  Mobility Specialist Time Calculation (min) (ACUTE ONLY) 17 min    Pre Mobility: 88 HR, 91% SpO2 Post Mobility:  91 HR,  91% SpO2  Received pt in bed having no complaints and agreeable to mobility. Successfully voided in BR before hallway ambulation. Pt was asymptomatic throughout ambulation and returned to room w/o fault. Left in bed w/ call bell in reach and all needs met. RN present.   [name] Mobility Specialist Please contact via Special educational needs teacher or Rehab office at [phone]

## 2023-03-14 LAB — CULTURE, BLOOD (ROUTINE X 2)
Culture: NO GROWTH
Culture: NO GROWTH
Special Requests: ADEQUATE
Special Requests: ADEQUATE

## 2023-03-14 LAB — CULTURE, RESPIRATORY W GRAM STAIN: Culture: NORMAL

## 2023-03-15 NOTE — Progress Notes (Signed)
Cardiology Office Note:    Date:  03/27/2023   ID:  Kirt Boys, DOB [birth year], MRN [id number]  PCP:  Jarrett Soho, PA-C  Cardiologist:  Reatha Harps, MD     Referring MD: Jarrett Soho, PA-C   Chief Complaint: hospital follow-up of atrial fibrillation/ flutter  History of Present Illness:    [name] is a 67 y.o. female with a history of paroxysmal atrial atrial fibrillation/ flutter on Eliquis, emphysema noted on chest CT in 2018, hypertension, hyperlipidemia, TIA, and GERD who is followed by Dr. Flora Lipps and presents today for hospital follow-up of atrial fibrillation/ flutter.   Patient was recently admitted from 03/08/2023 to 03/12/2023 for sepsis secondary to right lower lobe pneumonia after presenting with body aches, nausea/ vomiting, and a productive cough. She was noted to be tachycardic on arrival and was found to be in new onset atrial flutter with rates in the 150s. Echo showed LVEF of 60-65% with no regional wall motion abnormalities, normal RV, and no significant valvular disease. She was started on IV Heparin and IV Diltiazem and thankfully converted to sinus rhythm. She was transitioned to PO Diltiazem and Eliquis.  She then was noted to have go into atrial fibrillation and was started on Amiodarone. She converted back to sinus rhythm with this.   Patient presents today for follow-up. Here with her daughter. She reports continue intermittent palpitations that she describes as heart racing. These episodes occur about every other day but only last a few minutes at a time. She denies any other symptoms with this. No chest pain, shortness of breath, orthopnea, PND, edema, lightheadedness, dizziness, syncope. She is compliant with her Amiodarone and Eliquis. It looks like PO Diltiazem was recommended at discharge but she was not prescribed this. She was previously on Lopressor 25mg  twice daily prior to recent admission but this was stopped during  hospitalization. She had a mild cut on her finger the other day at work and it took a long time for the bleeding to completely stop but no other concerning bleeding. No hematochezia, melena, or hematuria.    EKGs/Labs/Other Studies Reviewed:    The following studies were reviewed:  Echocardiogram 03/09/2023: Impressions: 1. Left ventricular ejection fraction, by estimation, is 60 to 65%. The  left ventricle has normal function. The left ventricle has no regional  wall motion abnormalities. Left ventricular diastolic parameters were  normal.   2. Right ventricular systolic function is normal. The right ventricular  size is normal.   3. The mitral valve is normal in structure. No evidence of mitral valve  regurgitation. No evidence of mitral stenosis.   4. The aortic valve is grossly normal. Aortic valve regurgitation is not  visualized. No aortic stenosis is present.   5. The inferior vena cava is dilated in size with >50% respiratory  variability, suggesting right atrial pressure of 8 mmHg.   EKG:  EKG ordered today.  EKG Interpretation Date/Time:  Tuesday March 27 2023 11:13:59 EST Ventricular Rate:  75 PR Interval:  176 QRS Duration:  82 QT Interval:  414 QTC Calculation: 462 R Axis:   17  Text Interpretation: Normal sinus rhythm Non-specific T wave changes Confirmed by Marjie Skiff [phone]) on 03/27/2023 11:23:04 AM    Recent Labs: 03/08/2023: B Natriuretic Peptide 122.1; TSH 0.458 03/12/2023: ALT 32; BUN <5; Creatinine, Ser 0.67; Hemoglobin 9.6; Magnesium 1.8; Platelets 234; Potassium 3.6; Sodium 131  Recent Lipid Panel    Component Value Date/Time   CHOL 171 01/10/2017  0626   TRIG 262 (H) 01/10/2017 0626   HDL 36 (L) 01/10/2017 0626   CHOLHDL 4.8 01/10/2017 0626   VLDL 52 (H) 01/10/2017 0626   LDLCALC 83 01/10/2017 0626    Physical Exam:    Vital Signs: BP (!) 152/78   Pulse 75   Ht 5\' 1"  (1.549 m)   Wt 173 lb (78.5 kg)   SpO2 97%   BMI 32.69 kg/m      Wt Readings from Last 3 Encounters:  03/27/23 173 lb (78.5 kg)  03/12/23 177 lb 14.6 oz (80.7 kg)  08/17/22 172 lb (78 kg)     General: 67 y.o. Caucasian female in no acute distress. HEENT: Normocephalic and atraumatic. Sclera clear.  Neck: Supple. No JVD. Heart:  RRR. Distinct S1 and S2. No murmurs, gallops, or rubs.  Lungs: No increased work of breathing. Clear to ausculation bilaterally. No wheezes, rhonchi, or rales.  Abdomen: Soft, non-distended, and non-tender to palpation.  Extremities: Trace lower extremity edema bilaterally.  Skin: Warm and dry. Neuro: No focal deficits. Psych: Normal affect. Responds appropriately.   Assessment:    1. Paroxysmal atrial fibrillation/ flutter   2. Primary hypertension   3. Hyperlipidemia, unspecified hyperlipidemia type     Plan:    Paroxysmal Atrial Fibrillation/ Flutter Patient was recently noted to have new onset atrial fibrillation/ flutter during an admission in 02/2023 for sepsis secondary to pneumonia. Echo showed normal LV function. She was started on Diltiazem and Amiodarone and converted back to sinus rhythm.  - She continues to report short episodes of heart racing that occurring about every other day and last for a few minutes at a time. - Maintaining sinus rhythm on EKG today.  - Continue Amiodarone 200mg  daily. May be able to stop this after a couple of month given atrial fibrillation/ flutter was likely triggered by pneumonia.  - Looks like Cardiology recommended PO Diltiazem at discharge but she did not get sent home with this. She was previously on Lopressor 25mg  twice daily and still has this prescription at home so will just have her restart this. - Continue anticoagulation with Eliquis 5mg  twice daily.   Hypertension BP mildly elevated. Initially 144/80 and then 152/78 on my personal recheck at the end of visit. - Continue Losartan 100mg  daily.  - Will restart Lopressor 25mg  twice daily.   Hyperlipidemia Lipid  panel in 07/2022: Total Cholesterol 189, Triglycerides 422, HDL LDL 57. - Continue Crestor 10mg  daily.  - Labs followed by PCP. If triglycerides still elevated at time of next check, would recommended adding Vascepa.   Disposition: Follow up in 3 months.    Signed, Corrin Parker, PA-C  03/27/2023 12:24 PM    [facility]

## 2023-03-19 DIAGNOSIS — A419 Sepsis, unspecified organism: Secondary | ICD-10-CM | POA: Diagnosis not present

## 2023-03-19 DIAGNOSIS — E876 Hypokalemia: Secondary | ICD-10-CM | POA: Diagnosis not present

## 2023-03-25 DIAGNOSIS — B354 Tinea corporis: Secondary | ICD-10-CM | POA: Diagnosis not present

## 2023-03-27 ENCOUNTER — Ambulatory Visit: Payer: Medicare HMO | Attending: Student | Admitting: Student

## 2023-03-27 ENCOUNTER — Encounter: Payer: Self-pay | Admitting: Student

## 2023-03-27 VITALS — BP 152/78 | HR 75 | Ht 61.0 in | Wt 173.0 lb

## 2023-03-27 DIAGNOSIS — I10 Essential (primary) hypertension: Secondary | ICD-10-CM | POA: Diagnosis not present

## 2023-03-27 DIAGNOSIS — I480 Paroxysmal atrial fibrillation: Secondary | ICD-10-CM

## 2023-03-27 DIAGNOSIS — E785 Hyperlipidemia, unspecified: Secondary | ICD-10-CM

## 2023-03-27 NOTE — Patient Instructions (Addendum)
Medication Instructions:  Decrease Amiodarone 200 mg ( Take 1 Tablet Daily). Restart Metoprolol Tartrate 25 mg ( Take 1 TabletTwice Daily). *If you need a refill on your cardiac medications before your next appointment, please call your pharmacy*   Lab Work: No labs If you have labs (blood work) drawn today and your tests are completely normal, you will receive your results only by: MyChart Message (if you have MyChart) OR A paper copy in the mail If you have any lab test that is abnormal or we need to change your treatment, we will call you to review the results.   Testing/Procedures: No Testing   Follow-Up: At [facility], you and your health needs are our priority.  As part of our continuing mission to provide you with exceptional heart care, we have created designated Provider Care Teams.  These Care Teams include your primary Cardiologist (physician) and Advanced Practice Providers (APPs -  Physician Assistants and Nurse Practitioners) who all work together to provide you with the care you need, when you need it.  We recommend signing up for the patient portal called "MyChart".  Sign up information is provided on this After Visit Summary.  MyChart is used to connect with patients for Virtual Visits (Telemedicine).  Patients are able to view lab/test results, encounter notes, upcoming appointments, etc.  Non-urgent messages can be sent to your provider as well.   To learn more about what you can do with MyChart, go to [email or web address].    Your next appointment:   3 month(s)  Provider:   Marjie Skiff, PA-C  or , Reatha Harps, MD

## 2023-04-11 ENCOUNTER — Other Ambulatory Visit (HOSPITAL_COMMUNITY): Payer: Self-pay

## 2023-04-17 DIAGNOSIS — R300 Dysuria: Secondary | ICD-10-CM | POA: Diagnosis not present

## 2023-04-23 ENCOUNTER — Telehealth: Payer: Self-pay | Admitting: Cardiovascular Disease

## 2023-04-23 ENCOUNTER — Other Ambulatory Visit (HOSPITAL_COMMUNITY): Payer: Self-pay

## 2023-04-23 MED ORDER — AMIODARONE HCL 200 MG PO TABS: 200.0000 mg | ORAL_TABLET | Freq: Every day | ORAL | 3 refills | Status: DC

## 2023-04-23 NOTE — Telephone Encounter (Signed)
*  STAT* If patient is at the pharmacy, call can be transferred to refill team.   1. Which medications need to be refilled? (please list name of each medication and dose if known) amiodarone (PACERONE) 200 MG tablet    2. Would you like to learn more about the convenience, safety, & potential cost savings by using the [facility]?    3. Are you open to using the [facility] (Type [facility].  ).   4. Which pharmacy/location (including street and city if local pharmacy) is medication to be sent to? [id number] [place], [place] - [address]    5. Do they need a 30 day or 90 day supply? 90 day  Patient is out of medication

## 2023-05-02 DIAGNOSIS — R062 Wheezing: Secondary | ICD-10-CM | POA: Diagnosis not present

## 2023-05-02 DIAGNOSIS — Z03818 Encounter for observation for suspected exposure to other biological agents ruled out: Secondary | ICD-10-CM | POA: Diagnosis not present

## 2023-05-02 DIAGNOSIS — J069 Acute upper respiratory infection, unspecified: Secondary | ICD-10-CM | POA: Diagnosis not present

## 2023-05-24 ENCOUNTER — Encounter: Payer: Self-pay | Admitting: Acute Care

## 2023-05-31 DIAGNOSIS — R102 Pelvic and perineal pain: Secondary | ICD-10-CM | POA: Diagnosis not present

## 2023-06-01 DIAGNOSIS — L570 Actinic keratosis: Secondary | ICD-10-CM | POA: Diagnosis not present

## 2023-06-01 DIAGNOSIS — L209 Atopic dermatitis, unspecified: Secondary | ICD-10-CM | POA: Diagnosis not present

## 2023-06-01 DIAGNOSIS — Z79899 Other long term (current) drug therapy: Secondary | ICD-10-CM | POA: Diagnosis not present

## 2023-06-11 DIAGNOSIS — S8991XA Unspecified injury of right lower leg, initial encounter: Secondary | ICD-10-CM | POA: Diagnosis not present

## 2023-06-17 NOTE — Progress Notes (Signed)
 Cardiology Office Note:    Date:  06/25/2023   ID:  [name], DOB 08-16-1955, MRN [id number]  PCP:  Darnelle Elders, PA-C  Cardiologist:  Oneil Bigness, MD     Referring MD: Darnelle Elders, PA-C   Chief Complaint: follow-up of atrial fibrillation/ flutter  History of Present Illness:    [name] is a 68 y.o. female with a history of paroxysmal atrial atrial fibrillation/ flutter on Eliquis , emphysema noted on chest CT in 2018, hypertension, hyperlipidemia, TIA, and GERD who is followed by Dr. Rolm Clos and presents today for follow-up of atrial fibrillation/ flutter.    Patient was admitted in 02/2023 for sepsis secondary to right lower lobe pneumonia after presenting with body aches, nausea/ vomiting, and a productive cough. She was noted to be tachycardic on arrival and was found to be in new onset atrial flutter with rates in the 150s. Echo showed LVEF of 60-65% with no regional wall motion abnormalities, normal RV, and no significant valvular disease. She was started on IV Heparin  and IV Diltiazem  and thankfully converted to sinus rhythm. She was transitioned to PO Diltiazem  and Eliquis . She then was noted to have go into atrial fibrillation and was started on Amiodarone . She converted back to sinus rhythm with this.   She was last seen by me in 03/2023 at which time she continued to report intermittent palpitations that she described as heart racing but was otherwise doing okay from a cardiac standpoint. She was restarted on Lopressor .   Patient presents today for follow-up. Patient is overall doing well from a cardiac standpoint. She report a brief episode of heart racing yesterday with heart rates around 100 bpm. This occurred after she had a large drink from [facility] but did not last long. No other significant palpitations. No chest pain, shortness of breath, orthopnea, PND. She reports a little ankle swelling today but no other significant edema. No  lightheadedness, dizziness, or syncope. She is compliant with her medications and tolerating them well.   EKGs/Labs/Other Studies Reviewed:    The following studies were reviewed:  Echocardiogram 03/09/2023: Impressions: 1. Left ventricular ejection fraction, by estimation, is 60 to 65%. The  left ventricle has normal function. The left ventricle has no regional  wall motion abnormalities. Left ventricular diastolic parameters were  normal.   2. Right ventricular systolic function is normal. The right ventricular  size is normal.   3. The mitral valve is normal in structure. No evidence of mitral valve  regurgitation. No evidence of mitral stenosis.   4. The aortic valve is grossly normal. Aortic valve regurgitation is not  visualized. No aortic stenosis is present.   5. The inferior vena cava is dilated in size with >50% respiratory  variability, suggesting right atrial pressure of 8 mmHg.   EKG:  EKG not ordered today.   Recent Labs: 03/08/2023: B Natriuretic Peptide 122.1; TSH 0.458 03/12/2023: ALT 32; BUN <5; Creatinine, Ser 0.67; Hemoglobin 9.6; Magnesium  1.8; Platelets 234; Potassium 3.6; Sodium 131  Recent Lipid Panel    Component Value Date/Time   CHOL 171 01/10/2017 0626   TRIG 262 (H) 01/10/2017 0626   HDL 36 (L) 01/10/2017 0626   CHOLHDL 4.8 01/10/2017 0626   VLDL 52 (H) 01/10/2017 0626   LDLCALC 83 01/10/2017 0626    Physical Exam:    Vital Signs: BP 128/80   Pulse 66   Ht 5\' 1"  (1.549 m)   Wt 181 lb 12.8 oz (82.5 kg)   SpO2  97%   BMI 34.35 kg/m     Wt Readings from Last 3 Encounters:  06/25/23 181 lb 12.8 oz (82.5 kg)  03/27/23 173 lb (78.5 kg)  03/12/23 177 lb 14.6 oz (80.7 kg)     General: 68 y.o. Caucasian female in no acute distress. HEENT: Normocephalic and atraumatic. Sclera clear.  Neck: Supple.No JVD. Heart: RRR. Distinct S1 and S2. No murmurs, gallops, or rubs.  Lungs: No increased work of breathing. Clear to ausculation bilaterally. No  wheezes, rhonchi, or rales.  Abdomen: Soft, non-distended, and non-tender to palpation.  Extremities: No lower extremity edema.  Skin: Warm and dry. Neuro: No focal deficits. Psych: Normal affect. Responds appropriately.   Assessment:    1. Paroxysmal atrial fibrillation/ flutter   2. Primary hypertension   3. Hyperlipidemia, unspecified hyperlipidemia type     Plan:    Paroxysmal Atrial Fibrillation/ Flutter Patient was diagnosed with new onset atrial fibrillation/ flutter in 02/2023 during and admission for sepsis secondary to pneumonia. Echo showed normal LV function. She was started on Diltiazem  and Amiodarone  and converted back to sinus rhythm.  - Maintaining sinus rhythm on exam.   - Continue Lopressor  25mg  twice daily. - Continue anticoagulation with Eliquis  5mg  twice daily.  - Will get a 2 week Zio monitor to assess for recurrent atrial fibrillation. If no recurrence, may be able to stop Amiodarone  given atrial fibrillation/ flutter was likely triggered by pneumonia. - Provided patient assistance forms for Eliquis .   Hypertension BP well controlled. - Continue Losartan  100mg  daily and Lopressor  25mg  twice daily.   Hyperlipidemia Lipid panel in 07/2022: Total Cholesterol 189, Triglycerides 422, HDL LDL 57. - Continue Crestor  10mg  daily.  - Labs followed by PCP. If triglycerides still elevated at time of next check, would recommended adding Vascepa.   Disposition: Follow up in 6 months.    Sable Cram, PA-C  06/25/2023 4:49 PM    [facility]

## 2023-06-18 DIAGNOSIS — R5383 Other fatigue: Secondary | ICD-10-CM | POA: Diagnosis not present

## 2023-06-18 DIAGNOSIS — Z03818 Encounter for observation for suspected exposure to other biological agents ruled out: Secondary | ICD-10-CM | POA: Diagnosis not present

## 2023-06-18 DIAGNOSIS — R42 Dizziness and giddiness: Secondary | ICD-10-CM | POA: Diagnosis not present

## 2023-06-18 DIAGNOSIS — R197 Diarrhea, unspecified: Secondary | ICD-10-CM | POA: Diagnosis not present

## 2023-06-20 ENCOUNTER — Telehealth: Payer: Self-pay | Admitting: Cardiovascular Disease

## 2023-06-20 DIAGNOSIS — I480 Paroxysmal atrial fibrillation: Secondary | ICD-10-CM

## 2023-06-20 MED ORDER — APIXABAN 5 MG PO TABS: 5.0000 mg | ORAL_TABLET | Freq: Two times a day (BID) | ORAL | 1 refills | Status: AC

## 2023-06-20 NOTE — Telephone Encounter (Signed)
*  STAT* If patient is at the pharmacy, call can be transferred to refill team.   1. Which medications need to be refilled? (please list name of each medication and dose if known) apixaban  (ELIQUIS ) 5 MG TABS tablet (Expired)   2. Which pharmacy/location (including street and city if local pharmacy) is medication to be sent to? [id number] [place], [place] - [address]   3. Do they need a 30 day or 90 day supply? 90

## 2023-06-20 NOTE — Telephone Encounter (Signed)
 Prescription refill request for Eliquis  received. Indication:Afib  Last office visit: 03/27/23 [name])  Scr: 0.67 (03/12/23)  Age: 68  Weight: 78.5kg  Appropriate dose. Refill sent.

## 2023-06-25 ENCOUNTER — Ambulatory Visit: Payer: Medicare HMO | Attending: Student | Admitting: Student

## 2023-06-25 ENCOUNTER — Encounter: Payer: Self-pay | Admitting: Student

## 2023-06-25 VITALS — BP 128/80 | HR 66 | Ht 61.0 in | Wt 181.8 lb

## 2023-06-25 DIAGNOSIS — I480 Paroxysmal atrial fibrillation: Secondary | ICD-10-CM

## 2023-06-25 DIAGNOSIS — I10 Essential (primary) hypertension: Secondary | ICD-10-CM | POA: Diagnosis not present

## 2023-06-25 DIAGNOSIS — E785 Hyperlipidemia, unspecified: Secondary | ICD-10-CM

## 2023-06-25 NOTE — Patient Instructions (Signed)
 Medication Instructions:  The current medical regimen is effective;  continue present plan and medications as directed. Please refer to the Current Medication list given to you today.  *If you need a refill on your cardiac medications before your next appointment, please call your pharmacy*  Lab Work: NONE If you have labs (blood work) drawn today and your tests are completely normal, you will receive your results only by:  MyChart Message (if you have MyChart) OR A paper copy in the mail If you have any lab test that is abnormal or we need to change your treatment, we will call you to review the results.  Testing/Procedures: SEE ZIO INSTRUCTIONS BELOW-WILL BE MAILED TO YOU PLEASE FILL OUT ELIQUIS  PATIENT ASSISTANCE FORMS  Follow-Up: At [facility], you and your health needs are our priority.  As part of our continuing mission to provide you with exceptional heart care, we have created designated Provider Care Teams.  These Care Teams include your primary Cardiologist (physician) and Advanced Practice Providers (APPs -  Physician Assistants and Nurse Practitioners) who all work together to provide you with the care you need, when you need it.  Your next appointment:   6 month(s)  Provider:   Oneil Bigness, MD     Other Instructions

## 2023-06-26 ENCOUNTER — Ambulatory Visit: Payer: Medicare HMO | Attending: Student

## 2023-06-26 DIAGNOSIS — I480 Paroxysmal atrial fibrillation: Secondary | ICD-10-CM

## 2023-06-26 NOTE — Progress Notes (Unsigned)
Enrolled for [employer] to mail a ZIO XT long term holter monitor to the patients address on file.   Dr. Flora Lipps to read.

## 2023-06-30 DIAGNOSIS — I480 Paroxysmal atrial fibrillation: Secondary | ICD-10-CM | POA: Diagnosis not present

## 2023-07-02 DIAGNOSIS — N182 Chronic kidney disease, stage 2 (mild): Secondary | ICD-10-CM | POA: Diagnosis not present

## 2023-07-03 ENCOUNTER — Telehealth: Payer: Self-pay | Admitting: Cardiovascular Disease

## 2023-07-03 NOTE — Telephone Encounter (Signed)
 Patient dropped off application form for medications. Will leave in doctors box.

## 2023-07-05 NOTE — Telephone Encounter (Signed)
 FILLED OUT AND FAXED.

## 2023-07-05 NOTE — Telephone Encounter (Signed)
 Patient identification verified by 2 forms. Marilynn Rail, RN    Called and spoke to patient  Informed patient paperwork was filled out and faxed  Patient verbalized understanding, no questions at this time

## 2023-07-05 NOTE — Telephone Encounter (Signed)
 Patient is requesting call back to get update on forms. Please advise.

## 2023-07-19 ENCOUNTER — Telehealth: Payer: Self-pay | Admitting: Pharmacy Technician

## 2023-07-19 ENCOUNTER — Encounter: Payer: Self-pay | Admitting: Pharmacy Technician

## 2023-07-19 ENCOUNTER — Telehealth: Payer: Self-pay | Admitting: Cardiovascular Disease

## 2023-07-19 NOTE — Telephone Encounter (Signed)
 Pt called back and said she will call her medicare ins to set up payment plan bc she has not spent 3 percent of her income. She was not aware she needed to submit income and her medications with the assistance form

## 2023-07-19 NOTE — Telephone Encounter (Signed)
 I sent the patient a message and called her. Another encounter opened for this

## 2023-07-19 NOTE — Telephone Encounter (Signed)
   I called the patient and left a message. The office started this and then someone scanned in media the assistance required more information but I do not see where the patient was notified nor if more info was sent in. The request for more info was scanned in 07/11/23 and 07/06/23. I'm also sending the patient a mychart message  Note New Message:       Pt c/o medication issue:   1. Name of Medication: Eliquis   2. How are you currently taking this medication (dosage and times per day)?    3. Are you having a reaction (difficulty breathing--STAT)?    4. What is your medication issue? Patient is calling t o check on the status of her patient's assistance application           Alyson Ingles, LPN  Licensed Practical Nurse   Patient Instructions Signed   Encounter Date: 06/25/2023   Signed      Medication Instructions:  The current medical regimen is effective;  continue present plan and medications as directed. Please refer to the Current Medication list given to you today.  *If you need a refill on your cardiac medications before your next appointment, please call your pharmacy*   Lab Work: NONE If you have labs (blood work) drawn today and your tests are completely normal, you will receive your results only by:  MyChart Message (if you have MyChart) OR A paper copy in the mail If you have any lab test that is abnormal or we need to change your treatment, we will call you to review the results.   Testing/Procedures: SEE ZIO INSTRUCTIONS BELOW-WILL BE MAILED TO YOU PLEASE FILL OUT ELIQUIS PATIENT ASSISTANCE FORMS   Follow-Up: At [facility], you and your health needs are our priority.  As part of our continuing mission to provide you with exceptional heart care, we have created designated Provider Care Teams.  These Care Teams include your primary Cardiologist (physician) and Advanced Practice Providers (APPs -  Physician Assistants and Nurse Practitioners)  who all work together to provide you with the care you need, when you need it.   Your next appointment:   6 month(s)   Provider:   Reatha Harps, MD

## 2023-07-19 NOTE — Telephone Encounter (Signed)
 New Message:    Pt c/o medication issue:  1. Name of Medication: Eliquis  2. How are you currently taking this medication (dosage and times per day)?   3. Are you having a reaction (difficulty breathing--STAT)?   4. What is your medication issue? Patient is calling t o check on the status of her patient's assistance application

## 2023-07-21 DIAGNOSIS — I480 Paroxysmal atrial fibrillation: Secondary | ICD-10-CM | POA: Diagnosis not present

## 2023-07-30 ENCOUNTER — Other Ambulatory Visit: Payer: Self-pay

## 2023-07-30 DIAGNOSIS — L570 Actinic keratosis: Secondary | ICD-10-CM | POA: Diagnosis not present

## 2023-08-07 DIAGNOSIS — I4892 Unspecified atrial flutter: Secondary | ICD-10-CM | POA: Diagnosis not present

## 2023-08-07 DIAGNOSIS — I10 Essential (primary) hypertension: Secondary | ICD-10-CM | POA: Diagnosis not present

## 2023-08-07 DIAGNOSIS — E1122 Type 2 diabetes mellitus with diabetic chronic kidney disease: Secondary | ICD-10-CM | POA: Diagnosis not present

## 2023-08-07 DIAGNOSIS — E785 Hyperlipidemia, unspecified: Secondary | ICD-10-CM | POA: Diagnosis not present

## 2023-08-07 DIAGNOSIS — M810 Age-related osteoporosis without current pathological fracture: Secondary | ICD-10-CM | POA: Diagnosis not present

## 2023-08-07 DIAGNOSIS — I700 Atherosclerosis of aorta: Secondary | ICD-10-CM | POA: Diagnosis not present

## 2023-08-07 DIAGNOSIS — G4700 Insomnia, unspecified: Secondary | ICD-10-CM | POA: Diagnosis not present

## 2023-08-07 DIAGNOSIS — E1159 Type 2 diabetes mellitus with other circulatory complications: Secondary | ICD-10-CM | POA: Diagnosis not present

## 2023-08-07 DIAGNOSIS — Z6834 Body mass index (BMI) 34.0-34.9, adult: Secondary | ICD-10-CM | POA: Diagnosis not present

## 2023-09-19 DIAGNOSIS — L570 Actinic keratosis: Secondary | ICD-10-CM | POA: Diagnosis not present

## 2023-09-19 DIAGNOSIS — L988 Other specified disorders of the skin and subcutaneous tissue: Secondary | ICD-10-CM | POA: Diagnosis not present

## 2023-10-26 DIAGNOSIS — K219 Gastro-esophageal reflux disease without esophagitis: Secondary | ICD-10-CM | POA: Diagnosis not present

## 2023-10-26 DIAGNOSIS — K5900 Constipation, unspecified: Secondary | ICD-10-CM | POA: Diagnosis not present

## 2023-11-01 DIAGNOSIS — H8110 Benign paroxysmal vertigo, unspecified ear: Secondary | ICD-10-CM | POA: Diagnosis not present

## 2023-11-01 DIAGNOSIS — R350 Frequency of micturition: Secondary | ICD-10-CM | POA: Diagnosis not present

## 2023-11-01 DIAGNOSIS — Z6834 Body mass index (BMI) 34.0-34.9, adult: Secondary | ICD-10-CM | POA: Diagnosis not present

## 2023-11-12 DIAGNOSIS — N1831 Chronic kidney disease, stage 3a: Secondary | ICD-10-CM | POA: Diagnosis not present

## 2023-11-12 DIAGNOSIS — I10 Essential (primary) hypertension: Secondary | ICD-10-CM | POA: Diagnosis not present

## 2023-11-12 DIAGNOSIS — E785 Hyperlipidemia, unspecified: Secondary | ICD-10-CM | POA: Diagnosis not present

## 2023-11-12 DIAGNOSIS — M810 Age-related osteoporosis without current pathological fracture: Secondary | ICD-10-CM | POA: Diagnosis not present

## 2023-11-13 NOTE — Therapy (Signed)
 OUTPATIENT PHYSICAL THERAPY VESTIBULAR EVALUATION     Patient Name: [name] MRN: [id number] DOB:July 23, 1955, 68 y.o., female Today's Date: 11/14/2023  END OF SESSION:  PT End of Session - 11/14/23 0855     Visit Number 1    Number of Visits 7    Date for PT Re-Evaluation 12/14/23    Authorization Type [employer] MEDICARE    PT Start Time [phone]   pt late to eval   PT Stop Time 0927    PT Time Calculation (min) 33 min    Activity Tolerance Patient tolerated treatment well   dizziness   Behavior During Therapy [facility] for tasks assessed/performed          Past Medical History:  Diagnosis Date   GERD (gastroesophageal reflux disease)    High cholesterol    Hypertension    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   Osteoporosis    T SCORE OF -2.7 ON DEXA IN 2013   Squamous cell carcinoma of skin 01/04/2021   in situ- right post crown ([id number])   Past Surgical History:  Procedure Laterality Date   APPENDECTOMY     CHOLECYSTECTOMY     TONSILLECTOMY     Patient Active Problem List   Diagnosis Date Noted   Atrial fibrillation with rapid ventricular response (HCC) 03/12/2023   CAP (community acquired pneumonia) 03/09/2023   Hypomagnesemia 03/09/2023   Acute hypoxic respiratory failure (HCC) 03/09/2023   History of CAD (coronary artery disease) 03/09/2023   Atrial flutter with rapid ventricular response (HCC) 03/09/2023   Acute hyponatremia 04/15/2022   COVID-19 virus infection 04/15/2022   Non-insulin  dependent type 2 diabetes mellitus (HCC) 04/15/2022   Weakness 08/10/2021   Acute pyelonephritis 08/09/2021   AKI (acute kidney injury) (HCC) 08/09/2021   Hypokalemia 08/09/2021   Hyponatremia 08/09/2021   Generalized weakness 08/08/2021   TIA (transient ischemic attack) 03/03/2017   Leukocytosis 01/10/2017   Right sided weakness 01/09/2017   Chest pain, rule out acute myocardial infarction 01/09/2016   CLOSED FRACTURE OF UNSPECIFIED PART OF FIBULA 02/15/2010   FACIAL  RASH 01/26/2010   COUGH 01/26/2010   Nausea & vomiting 01/26/2010   MOTION SICKNESS 12/08/2009   CHOLECYSTECTOMY, LAPAROSCOPIC, HX OF 12/08/2009   CHOLELITHIASIS 10/20/2009   LIVER FUNCTION TESTS, ABNORMAL, HX OF 10/03/2009   VIRAL URI 09/23/2009   SEBORRHEA 09/23/2009   SHORTNESS OF BREATH 09/23/2009   INSOMNIA 07/21/2009   EDEMA 07/21/2009   ACUTE FRONTAL SINUSITIS 06/10/2009   WEIGHT GAIN 06/10/2009   ACUTE BRONCHITIS 05/27/2009   ACTINIC KERATOSIS 03/29/2009   VITAMIN D DEFICIENCY 02/23/2009   Depression 02/23/2009   ALLERGIC RHINITIS 02/23/2009   CONSTIPATION 02/23/2009   IRRITABLE BOWEL SYNDROME 02/23/2009   BENIGN POSITIONAL VERTIGO, HX OF 02/12/2009   HLD (hyperlipidemia) 12/29/2008   Essential hypertension 12/29/2008   GERD 12/29/2008   TOBACCO USE, QUIT 12/29/2008    PCP: Katina Pfeiffer, PA-C REFERRING PROVIDER: Katina Pfeiffer, PA-C  REFERRING DIAG: H81.10 (ICD-10-CM) - Benign paroxysmal vertigo, unspecified ear  THERAPY DIAG:  BPPV (benign paroxysmal positional vertigo), left  BPPV (benign paroxysmal positional vertigo), right  Dizziness and giddiness  ONSET DATE: 11/05/2023  Rationale for Evaluation and Treatment: Rehabilitation  SUBJECTIVE:   SUBJECTIVE STATEMENT: Has been having dizziness and has it quite often. Sometimes when she gets up the whole room is spinning and has always had issues with vertigo. Gets it so bad sometimes that it makes her sick. Notes that this started 4 years ago. Getting up real quick  and turning her head will make her dizzy. Sometimes unsure what will bring it on. Has seen a doctor regarding this and will just give her nausea pills. Has a hx of vertigo and was seen here a very long time. Takes Meclizine sometimes, not on it today, but did take it last night at [facility] . No falls.   Pt accompanied by: self  PERTINENT HISTORY: PMH: GERD, HTN, Osteoporosis, HLD  Light dizziness if moving head too quickly  PAIN:  Are you  having pain? No  Vitals:   11/14/23 0905  BP: (!) 152/93  Pulse: 63    Pt reports she took her BP medication this morning, normally has higher BP when she goes to the doctor   PRECAUTIONS: None   FALLS: Has patient fallen in last 6 months? No  LIVING ENVIRONMENT: Lives with: lives with their spouse and and grandson Stairs: None, 1 floor  Has following equipment at home: Single point cane  PLOF: Independent  PATIENT GOALS: Wants to get dizziness taken care of so she can exercise at a class   OBJECTIVE:  Note: Objective measures were completed at Evaluation unless otherwise noted.  DIAGNOSTIC FINDINGS: No recent imaging   COGNITION: Overall cognitive status: Within functional limits for tasks assessed   POSTURE:  rounded shoulders  Cervical ROM:   WFL, pt reports worse dizziness when looking up   VESTIBULAR ASSESSMENT:  GENERAL OBSERVATION: Ambulates in and out of clinic with no AD independently.    SYMPTOM BEHAVIOR:  Subjective history: See above.   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)  Ocular ROM: decr with L eye when looking to R visual field due to L eye being held in abducted position   Spontaneous Nystagmus: absent  Gaze-Induced Nystagmus: left beating with right gaze and mild  Smooth Pursuits: intact and jumpy eye movements at times   Saccades: hypometric/undershoots, extra eye movements, and about 4 beats going to the R     VESTIBULAR - OCULAR REFLEX:   Slow VOR: Normal, pt reports feeling a little dizzy    VOR Cancellation: Normal, moderate dizziness   Head-Impulse Test: HIT Right: positive HIT Left: negative Pt reporting initially feeling dizzy     POSITIONAL TESTING: Right Roll Test: apogeotropic nystagmus and lasting approx. 1 minute Left Roll Test: apogeotropic nystagmus, symptoms worse on left side, and lasting approx. 1 minute  Right Sidelying: reports no dizziness in position or with return to upright  Left Sidelying: pt reporting spinning dizziness in position and with return to sitting upright, assessed again and pt reporting no spinning  TREATMENT DATE: 11/14/23  Self-Care: Discussed that pt likely has horizontal canal BPPV, wants to re-assess when pt not on Meclizine (pt took it at 7 PM last night), appears as if it is cupulolithiasis. Educated to work on repeated rolling from R/L 4-5 reps, 2 times a day to pt's tolerance before next appt. Discussed holding each position until dizziness subsides. Pt reports that she did not need a handout on this at this time  Educated on Meclizine and how it can suppress vestibular system  Began to provide BPPV education, will need to further educate at next session, ran out of time during eval      PATIENT EDUCATION: Education details: Clinical findings, POC, see Self-Care  Person educated: Patient Education method: Explanation Education comprehension: verbalized understanding and needs further education  HOME EXERCISE PROGRAM: Will provide at future session as needed   GOALS: Goals reviewed with patient? Yes  SHORT TERM GOALS: ALL STGS = LTGS  LONG TERM GOALS: Target date: 12/12/2023  Pt will demo negative positional testing in order to demo decr dizziness.  Baseline: pt with horizontal canal cupulolithiasis  Goal status: INITIAL  2.  DHI to be assessed with goal written Baseline:  Goal status:  INITIAL  3.  Further vestibular/balance testing to be assessed as needed with LTG written.  Baseline:  Goal status: INITIAL    ASSESSMENT:  CLINICAL IMPRESSION: Patient is a 68 year old female referred to Neuro OPPT for BPPV.   Pt's PMH is significant for: GERD, HTN, Osteoporosis, HLD. 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 Pt also with dizziness with slow VOR and VOR cancellation. Pt reporting room spinning dizziness with L sidelying and with return to upright, but no nystagmus noted. Pt did have apogeotropic nystagmus in bilateral roll tests with worse symptoms to the L, indicating R horizontal cupulolithiasis. Pt took Meclizine last night so advised pt to not take it before next appt as it could be masking nystagmus/sx. Will re-assess positional testing and treat as appropriate (did not have time during eval). Pt would benefit from skilled PT to address these impairments and functional limitations to maximize functional mobility independence   OBJECTIVE IMPAIRMENTS: decreased balance and dizziness.   ACTIVITY LIMITATIONS: locomotion level  PARTICIPATION LIMITATIONS: community activity  PERSONAL FACTORS:   are also affecting patient's functional outcome.   REHAB POTENTIAL: Good  CLINICAL DECISION MAKING: Evolving/moderate complexity  EVALUATION COMPLEXITY: Moderate   PLAN:  PT FREQUENCY: 2x/week  PT DURATION: 4 weeks  PLANNED INTERVENTIONS: [id number]- PT Re-evaluation, 97110-Therapeutic exercises, 97530- Therapeutic activity, 97112- Neuromuscular re-education, 97535- Self Care, [id number]- Manual therapy, [phone]- Canalith repositioning, Patient/Family education, Balance training, and Vestibular training  PLAN FOR NEXT SESSION: re-assess positional testing now that pt not on meclizine, check BP, how was rolling? Appears to be R horizontal canal cupulolithiasis    [place] LOISE Senate, PT, DPT 11/14/2023, 11:41 AM

## 2023-11-14 ENCOUNTER — Ambulatory Visit: Attending: Family Medicine | Admitting: Physical Therapy

## 2023-11-14 ENCOUNTER — Encounter: Payer: Self-pay | Admitting: Physical Therapy

## 2023-11-14 VITALS — BP 152/93 | HR 63

## 2023-11-14 DIAGNOSIS — H8112 Benign paroxysmal vertigo, left ear: Secondary | ICD-10-CM | POA: Diagnosis not present

## 2023-11-14 DIAGNOSIS — R42 Dizziness and giddiness: Secondary | ICD-10-CM | POA: Diagnosis not present

## 2023-11-14 DIAGNOSIS — H8111 Benign paroxysmal vertigo, right ear: Secondary | ICD-10-CM | POA: Insufficient documentation

## 2023-11-16 DIAGNOSIS — J069 Acute upper respiratory infection, unspecified: Secondary | ICD-10-CM | POA: Diagnosis not present

## 2023-11-19 ENCOUNTER — Ambulatory Visit: Admitting: Physical Therapy

## 2023-11-19 ENCOUNTER — Encounter: Payer: Self-pay | Admitting: Physical Therapy

## 2023-11-19 VITALS — BP 188/92 | HR 62

## 2023-11-19 DIAGNOSIS — R42 Dizziness and giddiness: Secondary | ICD-10-CM

## 2023-11-19 DIAGNOSIS — H8112 Benign paroxysmal vertigo, left ear: Secondary | ICD-10-CM

## 2023-11-19 DIAGNOSIS — I10 Essential (primary) hypertension: Secondary | ICD-10-CM | POA: Diagnosis not present

## 2023-11-19 DIAGNOSIS — H8111 Benign paroxysmal vertigo, right ear: Secondary | ICD-10-CM

## 2023-11-19 DIAGNOSIS — H6980 Other specified disorders of Eustachian tube, unspecified ear: Secondary | ICD-10-CM | POA: Diagnosis not present

## 2023-11-19 DIAGNOSIS — Z6834 Body mass index (BMI) 34.0-34.9, adult: Secondary | ICD-10-CM | POA: Diagnosis not present

## 2023-11-19 DIAGNOSIS — R000 Tachycardia, unspecified: Secondary | ICD-10-CM | POA: Diagnosis not present

## 2023-11-19 NOTE — Therapy (Signed)
 OUTPATIENT PHYSICAL THERAPY VESTIBULAR TREATMENT     Patient Name: [name] MRN: [id number] DOB:10-09-1955, 68 y.o., female Today's Date: 11/19/2023  END OF SESSION:  PT End of Session - 11/19/23 0851     Visit Number 2    Number of Visits 7    Date for PT Re-Evaluation 12/14/23    Authorization Type [employer] MEDICARE    PT Start Time 0850    PT Stop Time 0930    PT Time Calculation (min) 40 min    Activity Tolerance Patient tolerated treatment well    Behavior During Therapy WFL for tasks assessed/performed          Past Medical History:  Diagnosis Date   GERD (gastroesophageal reflux disease)    High cholesterol    Hypertension    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   Osteoporosis    T SCORE OF -2.7 ON DEXA IN 2013   Squamous cell carcinoma of skin 01/04/2021   in situ- right post crown ([id number])   Past Surgical History:  Procedure Laterality Date   APPENDECTOMY     CHOLECYSTECTOMY     TONSILLECTOMY     Patient Active Problem List   Diagnosis Date Noted   Atrial fibrillation with rapid ventricular response (HCC) 03/12/2023   CAP (community acquired pneumonia) 03/09/2023   Hypomagnesemia 03/09/2023   Acute hypoxic respiratory failure (HCC) 03/09/2023   History of CAD (coronary artery disease) 03/09/2023   Atrial flutter with rapid ventricular response (HCC) 03/09/2023   Acute hyponatremia 04/15/2022   COVID-19 virus infection 04/15/2022   Non-insulin  dependent type 2 diabetes mellitus (HCC) 04/15/2022   Weakness 08/10/2021   Acute pyelonephritis 08/09/2021   AKI (acute kidney injury) (HCC) 08/09/2021   Hypokalemia 08/09/2021   Hyponatremia 08/09/2021   Generalized weakness 08/08/2021   TIA (transient ischemic attack) 03/03/2017   Leukocytosis 01/10/2017   Right sided weakness 01/09/2017   Chest pain, rule out acute myocardial infarction 01/09/2016   CLOSED FRACTURE OF UNSPECIFIED PART OF FIBULA 02/15/2010   FACIAL RASH 01/26/2010   COUGH  01/26/2010   Nausea & vomiting 01/26/2010   MOTION SICKNESS 12/08/2009   CHOLECYSTECTOMY, LAPAROSCOPIC, HX OF 12/08/2009   CHOLELITHIASIS 10/20/2009   LIVER FUNCTION TESTS, ABNORMAL, HX OF 10/03/2009   VIRAL URI 09/23/2009   SEBORRHEA 09/23/2009   SHORTNESS OF BREATH 09/23/2009   INSOMNIA 07/21/2009   EDEMA 07/21/2009   ACUTE FRONTAL SINUSITIS 06/10/2009   WEIGHT GAIN 06/10/2009   ACUTE BRONCHITIS 05/27/2009   ACTINIC KERATOSIS 03/29/2009   VITAMIN D DEFICIENCY 02/23/2009   Depression 02/23/2009   ALLERGIC RHINITIS 02/23/2009   CONSTIPATION 02/23/2009   IRRITABLE BOWEL SYNDROME 02/23/2009   BENIGN POSITIONAL VERTIGO, HX OF 02/12/2009   HLD (hyperlipidemia) 12/29/2008   Essential hypertension 12/29/2008   GERD 12/29/2008   TOBACCO USE, QUIT 12/29/2008    PCP: Katina Pfeiffer, PA-C REFERRING PROVIDER: Katina Pfeiffer, PA-C  REFERRING DIAG: H81.10 (ICD-10-CM) - Benign paroxysmal vertigo, unspecified ear  THERAPY DIAG:  BPPV (benign paroxysmal positional vertigo), left  BPPV (benign paroxysmal positional vertigo), right  Dizziness and giddiness  ONSET DATE: 11/05/2023  Rationale for Evaluation and Treatment: Rehabilitation  SUBJECTIVE:   SUBJECTIVE STATEMENT: Had a little dizziness yesterday, got up during the day to go to the restroom. Felt a spinning dizziness that went away quickly. Tried the rolling a couple times at home, did not get dizzy when performing. Last time she took Meclizine was the day before PT eval. [facility], found that she had fluid behind her eardrum and just to take Mucinex . Started taking it, but did not take any yesterday.    Pt accompanied by: self  PERTINENT HISTORY: PMH: GERD, HTN, Osteoporosis, HLD  Light dizziness if moving head too quickly  PAIN:  Are you having pain? No  Vitals:   11/19/23 0853  BP: (!) 188/92  Pulse: 62     Pt reports she took her BP medication this morning,  normally has higher BP when she goes to the doctor   PRECAUTIONS: None   FALLS: Has patient fallen in last 6 months? No  LIVING ENVIRONMENT: Lives with: lives with their spouse and and grandson Stairs: None, 1 floor  Has following equipment at home: Single point cane  PLOF: Independent  PATIENT GOALS: Wants to get dizziness taken care of so she can exercise at a class   OBJECTIVE:  Note: Objective measures were completed at Evaluation unless otherwise noted.  DIAGNOSTIC FINDINGS: No recent imaging   COGNITION: Overall cognitive status: Within functional limits for tasks assessed   POSTURE:  rounded shoulders  Cervical ROM:   WFL, pt reports worse dizziness when looking up   VESTIBULAR ASSESSMENT:  GENERAL OBSERVATION: Ambulates in and out of clinic with no AD independently.    SYMPTOM BEHAVIOR:  Subjective history: See above.   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)  Ocular ROM: decr with L eye when looking to R visual field due to L eye being held in abducted position   Spontaneous Nystagmus: absent  Gaze-Induced Nystagmus: left beating with right gaze and mild  Smooth Pursuits: intact and jumpy eye movements at times   Saccades: hypometric/undershoots, extra eye movements, and about 4 beats going to the R     VESTIBULAR - OCULAR REFLEX:   Slow VOR: Normal, pt reports feeling a little dizzy   VOR Cancellation: Normal, moderate dizziness   Head-Impulse Test: HIT Right: positive HIT Left: negative Pt reporting initially  feeling dizzy                                                                                                                               TREATMENT DATE: 11/19/23   Therapeutic Activity:  Vitals:   11/19/23 0853  BP: (!) 188/92  Pulse: 62    PT also took pt's BP manually and BP was 188/92. Pt reporting that she is asymptomatic and took BP medication 30 minutes ago   Pt reporting over  the weekend, blood pressure was up to 188/82. Pt sees her PCP next week (thinks that is the 18th).   Re-assessed manually after a few minutes at 165/88, pt still asymptomatic PT wrote down pt's BP values to take to PCP    POSITIONAL TESTING: Right Roll Test: apogeotropic nystagmus and pt reporting no dizziness, nystagmus lasting >60 seconds Left Roll Test: apogeotropic nystagmus and very mild, pt reporting no dizziness Right Sidelying: no nystagmus and no dizziness  Left Sidelying: no nystagmus and initially pt reporting brief spinning dizziness, but no nystagmus noted. Checked it again with pt appearing to have apogeoptric mild nystagmus   Performed Gufoni maneuver to treat HC-BPPV cupulolithiasis (incr nystagmus on R side, so treated on L side as affected side), pt reporting no dizziness in position, pt reporting feeling lightheaded when coming upright into position   When re-assessing roll test, pt with more mild apogeotropic nystagmus when rolling to the R and also continued apogeotropic nystagmus with L roll test (more noticeable compared to initial L roll test), pt continued to not report any dizziness in each position   PATIENT EDUCATION: Education details: Continue to monitor BP and following up with PCP regarding elevated BP, education regarding findings during session, continue to perform repeated rolling at home to see if it helps with nystagmus (even though pt not dizzy with roll test), if no change, will likely need to follow up with PCP regarding this for further assessment  Person  educated: Patient Education method: Explanation Education comprehension: verbalized understanding and needs further education  HOME EXERCISE PROGRAM: Will provide at future session as needed   GOALS: Goals reviewed with patient? Yes  SHORT TERM GOALS: ALL STGS = LTGS  LONG TERM GOALS: Target date: 12/12/2023  Pt will demo negative positional testing in order to demo decr dizziness.  Baseline: pt with horizontal canal cupulolithiasis  Goal status: INITIAL  2.  DHI to be assessed with goal written Baseline:  Goal status: INITIAL  3.  Further vestibular/balance testing to be assessed as needed with LTG written.  Baseline:  Goal status: INITIAL    ASSESSMENT:  CLINICAL IMPRESSION: Pt initially with elevated BP (took her BP medication prior to coming to therapy) and reports that is has been elevated and tends to be more elevated when going to doctor's offices. After seated rest break, pt's BP able to decr to be within functional limits for therapy. Pt asymptomatic. Educated to continue to monitor BP at home and follow up with PCP regarding this (sees PCP next week). Re-assessed positional testing now that pt not on Meclizine. Pt with apogeotropic nystagmus with roll test R>L side with incr symptoms, indicating potentially L horizontal canal cupulolithiasis. This nystagmus did not fatigue and pt reporting no dizziness in either position. Attempted Gufoni maneuver for cupulolithiasis with L side being the affected side. Pt with no dizziness during maneuver, just reporting some lightheadedness with return to upright. With re-assessment, pt with more mild nystagmus with R roll test and more noticeable apogeotropic nystagmus with L roll test. Pt continuing to report no dizziness. Discussed to continue to perform repeated rolling at home and will re-assess at next session. If no change, pt will benefit from follow up with PCP regarding symptoms and likely need further assessment due to potential  central involvement.     OBJECTIVE IMPAIRMENTS: decreased balance and dizziness.   ACTIVITY LIMITATIONS: locomotion level  PARTICIPATION LIMITATIONS: community activity  PERSONAL FACTORS:   are also affecting patient's functional outcome.   REHAB  POTENTIAL: Good  CLINICAL DECISION MAKING: Evolving/moderate complexity  EVALUATION COMPLEXITY: Moderate   PLAN:  PT FREQUENCY: 2x/week  PT DURATION: 4 weeks  PLANNED INTERVENTIONS: 97164- PT Re-evaluation, 97110-Therapeutic exercises, 97530- Therapeutic activity, 97112- Neuromuscular re-education, 97535- Self Care, [id number]- Manual therapy, [phone]- Canalith repositioning, Patient/Family education, Balance training, and Vestibular training  PLAN FOR NEXT SESSION: CHECK BP!!! re-assess positional testing again. Unsure exactly cause of pt's dizziness or if it is BPPV    [place]ffield LOISE Senate, PT, DPT 11/19/2023, 9:36 AM

## 2023-11-21 DIAGNOSIS — Z79899 Other long term (current) drug therapy: Secondary | ICD-10-CM | POA: Diagnosis not present

## 2023-11-21 DIAGNOSIS — L570 Actinic keratosis: Secondary | ICD-10-CM | POA: Diagnosis not present

## 2023-11-21 DIAGNOSIS — L209 Atopic dermatitis, unspecified: Secondary | ICD-10-CM | POA: Diagnosis not present

## 2023-11-22 ENCOUNTER — Ambulatory Visit: Admitting: Physical Therapy

## 2023-11-26 ENCOUNTER — Ambulatory Visit

## 2023-11-26 ENCOUNTER — Telehealth: Payer: Self-pay | Admitting: Cardiovascular Disease

## 2023-11-26 DIAGNOSIS — R002 Palpitations: Secondary | ICD-10-CM | POA: Diagnosis not present

## 2023-11-26 DIAGNOSIS — I10 Essential (primary) hypertension: Secondary | ICD-10-CM | POA: Diagnosis not present

## 2023-11-26 DIAGNOSIS — R000 Tachycardia, unspecified: Secondary | ICD-10-CM | POA: Diagnosis not present

## 2023-11-26 NOTE — Telephone Encounter (Signed)
 Left message to call back

## 2023-11-26 NOTE — Telephone Encounter (Signed)
 Patient c/o Palpitations:  STAT if patient reporting lightheadedness, shortness of breath, or chest pain  How long have you had palpitations/irregular HR/ Afib? afibAre you having the symptoms now? No but over the weekend  Are you currently experiencing lightheadedness, SOB or CP? No  Do you have a history of afib (atrial fibrillation) or irregular heart rhythm? Yes afib  Have you checked your BP or HR? (document readings if available): 146/84 hr 72 @ pcp office and they recommended pt f/u  Are you experiencing any other symptoms? No

## 2023-11-27 NOTE — Telephone Encounter (Signed)
 Left message for patient to callback.  Patient due for 6 month F/U in August 2025, scheduled appt with APP on 01/08/24 at 3:35 PM.

## 2023-11-27 NOTE — Telephone Encounter (Signed)
Pt returning nurse call. Please advise.

## 2023-11-29 ENCOUNTER — Ambulatory Visit

## 2023-12-03 ENCOUNTER — Ambulatory Visit

## 2023-12-03 DIAGNOSIS — N182 Chronic kidney disease, stage 2 (mild): Secondary | ICD-10-CM | POA: Diagnosis not present

## 2023-12-03 DIAGNOSIS — R600 Localized edema: Secondary | ICD-10-CM | POA: Diagnosis not present

## 2023-12-03 DIAGNOSIS — E1122 Type 2 diabetes mellitus with diabetic chronic kidney disease: Secondary | ICD-10-CM | POA: Diagnosis not present

## 2023-12-03 DIAGNOSIS — I129 Hypertensive chronic kidney disease with stage 1 through stage 4 chronic kidney disease, or unspecified chronic kidney disease: Secondary | ICD-10-CM | POA: Diagnosis not present

## 2023-12-03 DIAGNOSIS — E871 Hypo-osmolality and hyponatremia: Secondary | ICD-10-CM | POA: Diagnosis not present

## 2023-12-03 NOTE — Therapy (Signed)
 [facility] [address] [place], [place], [address] Phone: [phone]   Fax:  [phone]  Patient Details  Name: [name] MRN: [id number] Date of Birth: [birth year] Referring Provider:  No ref. provider found  Encounter Date: 12/03/2023  PHYSICAL THERAPY DISCHARGE SUMMARY  Visits from Start of Care: 2  Current functional level related to goals / functional outcomes: Unable to assess as patient has not returned since last visit   Remaining deficits: See above   Education / Equipment: PT POC, exam findings, safe BP parameters  Patient agrees to discharge. Patient goals were unable to be assessed. Patient is being discharged due to the patient's request.  Delon DELENA Pop, PT Delon DELENA Pop, PT, DPT, CBIS  12/03/2023, 11:56 AM  [facility] [address] [place], [place], [address] Phone: [phone]   Fax:  [phone]

## 2023-12-05 ENCOUNTER — Ambulatory Visit: Admitting: Physician Assistant

## 2023-12-05 NOTE — Progress Notes (Deleted)
 Cardiology Office Note:  .   Date:  12/05/2023  ID:  [name], DOB 09/21/1955, MRN [id number] PCP: Catherine Pfeiffer, PA-C  [facility] Providers Cardiologist:  Catherine ONEIDA Decent, MD {  History of Present Illness: .   [name] is a 68 y.o. female  with PMHx of paroxysmal atrial atrial fibrillation/ flutter on Eliquis  (diagnosed 02/2023 in the setting of sepsis and PNA, converted to NSR with IV Dilt, then back to A-fib, converted back to NSR with amiodarone ), emphysema noted on chest CT in 2018, hypertension, hyperlipidemia, TIA, and GERD who reports to [facility] office for follow up.   Last seen in heartcare 06/25/2023 with with Catherine Goodrich, PA-C for A-fib/flutter follow-up.  Reported recent brief episode of heart racing around 100 bpm on 2/09, which occurred after having large drink from [place].  Also reported a little ankle swelling that day but no significant edema on exam.  Denied any other significant palpitations and any other cardiac symptoms.  Today, reports ### and denies ###.  Reports compliance with medications.  Dietary habitats:  Activity level:  Social: Denies tobacco use/Binging ETOH/drug use  Denies any hospitalizations or visits to the emergency department.   Paroxysmal atrial fibrillation/ flutter Diagnosed 02/2023 in the setting of sepsis and PNA, converted to NSR with IV Dilt, then back to A-fib, converted back to NSR with amiodarone  Maintaining sinus rhythm on exam Continue on Lopressor  25 mg BID and Eliquis  5 mg BID.  Eliquis  is the appropriate dose given age, weight and renal function. (68 y/o, Cr 0.67 in 02/2023, Wt ?)  (Age > 80, Body wt < 60 kg, Cr > 1.5; if +2 then decrease dose  2.5 mg BID)   Primary hypertension BP in OV today, well-controlled:  02/2023 Cr 0.87, K 3.6 Continue on losartan  100 mg and Lopressor  25 mg BID  Hyperlipidemia, unspecified hyperlipidemia type 02/2023 LFT WNL, LDL??? Continue on Crestor  10  mg  ROS: 10 point review of system has been reviewed and considered negative except ones been listed in the HPI.   Studies Reviewed: .   Echocardiogram 03/09/2023: Impressions: 1. Left ventricular ejection fraction, by estimation, is 60 to 65%. The  left ventricle has normal function. The left ventricle has no regional  wall motion abnormalities. Left ventricular diastolic parameters were  normal.   2. Right ventricular systolic function is normal. The right ventricular  size is normal.   3. The mitral valve is normal in structure. No evidence of mitral valve  regurgitation. No evidence of mitral stenosis.   4. The aortic valve is grossly normal. Aortic valve regurgitation is not  visualized. No aortic stenosis is present.   5. The inferior vena cava is dilated in size with >50% respiratory  variability, suggesting right atrial pressure of 8 mmHg.  Risk Assessment/Calculations:   {Does this patient have ATRIAL FIBRILLATION?:[phone]} No BP recorded.  {Refresh Note OR Click here to enter BP  :1}***       Physical Exam:   VS:  There were no vitals taken for this visit.   Wt Readings from Last 3 Encounters:  06/25/23 181 lb 12.8 oz (82.5 kg)  03/27/23 173 lb (78.5 kg)  03/12/23 177 lb 14.6 oz (80.7 kg)    GEN: Well nourished, well developed in no acute distress while sitting in chair.  NECK: No JVD; No carotid bruits CARDIAC: ***RRR, no murmurs, rubs, gallops RESPIRATORY:  Clear to auscultation without rales, wheezing or rhonchi  ABDOMEN: Soft, non-tender, non-distended EXTREMITIES:  No edema; No deformity   ASSESSMENT AND PLAN: .   ***    {Are you ordering a CV Procedure (e.g. stress test, cath, DCCV, TEE, etc)?   Press F2        :[id number]}  Dispo: ***  Signed, Catherine CINDERELLA Kapur, PA-C

## 2023-12-05 NOTE — Telephone Encounter (Signed)
 Left message for pt to call.

## 2023-12-06 ENCOUNTER — Encounter: Admitting: Physical Therapy

## 2023-12-12 NOTE — Progress Notes (Deleted)
 Cardiology Office Note    Date:  12/12/2023  ID:  [name], DOB [birth year], MRN [id number] PCP:  Katina Pfeiffer, PA-C  Cardiologist:  Darryle ONEIDA Decent, MD  Electrophysiologist:  None   Chief Complaint: ***  History of Present Illness: .    [name] is a 68 y.o. female with visit-pertinent history of paroxysmal atrial fibrillation/flutter on Eliquis , emphysema noted on chest CT in 2010, hypertension, hyperlipidemia, TIA and GERD.  Patient was admitted in 02/2023 for sepsis secondary to right lower lobe pneumonia after presenting with bodyaches, nausea/vomiting and a productive cough.  She was noted to be tachycardic on arrival and was found to be in new onset atrial flutter with rates in the 150s.  Echo showed LVEF 60 to 65% with no regional wall motion abnormalities, normal RV and no significant valvular disease.  She is started on IV heparin  and IV diltiazem , converted to sinus rhythm.  She was transition to p.o. diltiazem  and Eliquis .  Patient was then noted to have gone back into atrial fibrillation and was started on amiodarone , converted back to sinus rhythm with this.  At office visit in 03/2019 for patient reported intermittent palpitations that she described as heart racing, was otherwise doing okay from a cardiac standpoint.  She was started on Lopressor .  Patient was last in clinic on 06/25/2023 by Aline Door, PA.  Patient was doing overall well from a cardiac standpoint.  She reported a brief episode of heart racing the day prior to her appointment with heart rates around 100 bpm.  Patient reported to cardiac she had a large from her [employer] for did not last long.  2-week ZIO monitor was ordered, this showed an average heart rate of 64 bpm ranging from 41 bpm to 233 bpm.  Predominant underlying rhythm was sinus rhythm, she had 2 runs of supraventricular tachycardia, run with fast interval lasting 8 beats with a max rate of 133 bpm, longest lasting 12  beats with an average rate of 119 bpm.  Patient had occasional PVCs at 1.4% burden.  Patient's amiodarone  was discontinued.  Today she presents regarding  PAF/Flutter: Patient previously diagnosed with new onset atrial fibrillation/flutter in 02/2023 during admission for sepsis secondary to pneumonia.  Echo showed normal LV function.  She was tried on diltiazem  and amiodarone , converted back to sinus rhythm.  2-week ZIO monitor in March 2025 showed predominant rhythm was sinus rhythm, no evidence of atrial fibrillation and her amiodarone  was discontinued. Today she reports Continue  Hypertension: Blood pressure   Hyperlipidemia: Last lipid profile on    Labwork independently reviewed:   ROS: .   *** denies chest pain, shortness of breath, lower extremity edema, fatigue, palpitations, melena, hematuria, hemoptysis, diaphoresis, weakness, presyncope, syncope, orthopnea, and PND.  All other systems are reviewed and otherwise negative.  Studies Reviewed: SABRA    EKG:  EKG is ordered today, personally reviewed, demonstrating ***     CV Studies: Cardiac studies reviewed are outlined and summarized above. Otherwise please see EMR for full report. Cardiac Studies & Procedures   ______________________________________________________________________________________________     ECHOCARDIOGRAM  ECHOCARDIOGRAM COMPLETE 03/09/2023  Narrative ECHOCARDIOGRAM REPORT    Patient Name:   [name] Date of Exam: 03/09/2023 Medical Rec #:  [id number]             Height:       61.0 in Accession #:    [id number]            Weight:  172.0 lb Date of Birth:  Mar 28, 1956              BSA:          1.771 m Patient Age:    67 years              BP:           136/64 mmHg Patient Gender: F                     HR:           84 bpm. Exam Location:  Inpatient  Procedure: 2D Echo, Color Doppler and Cardiac Doppler  Indications:    Ventricular Tachycardia  History:        Patient has no  prior history of Echocardiogram examinations. CAD, TIA, Arrythmias:SVT; Risk Factors:Diabetes, Hypertension and Dyslipidemia.  Sonographer:    Melissa Kafa Referring Phys: [id number] SUBRINA SUNDIL  IMPRESSIONS   1. Left ventricular ejection fraction, by estimation, is 60 to 65%. The left ventricle has normal function. The left ventricle has no regional wall motion abnormalities. Left ventricular diastolic parameters were normal. 2. Right ventricular systolic function is normal. The right ventricular size is normal. 3. The mitral valve is normal in structure. No evidence of mitral valve regurgitation. No evidence of mitral stenosis. 4. The aortic valve is grossly normal. Aortic valve regurgitation is not visualized. No aortic stenosis is present. 5. The inferior vena cava is dilated in size with >50% respiratory variability, suggesting right atrial pressure of 8 mmHg.  Comparison(s): No prior Echocardiogram.  FINDINGS Left Ventricle: Left ventricular ejection fraction, by estimation, is 60 to 65%. The left ventricle has normal function. The left ventricle has no regional wall motion abnormalities. The left ventricular internal cavity size was normal in size. There is no left ventricular hypertrophy. Left ventricular diastolic parameters were normal.  Right Ventricle: The right ventricular size is normal. No increase in right ventricular wall thickness. Right ventricular systolic function is normal.  Left Atrium: Left atrial size was normal in size.  Right Atrium: Right atrial size was normal in size.  Pericardium: There is no evidence of pericardial effusion.  Mitral Valve: The mitral valve is normal in structure. No evidence of mitral valve regurgitation. No evidence of mitral valve stenosis.  Tricuspid Valve: The tricuspid valve is grossly normal. Tricuspid valve regurgitation is not demonstrated. No evidence of tricuspid stenosis.  Aortic Valve: The aortic valve is grossly normal.  Aortic valve regurgitation is not visualized. No aortic stenosis is present. Aortic valve mean gradient measures 7.0 mmHg. Aortic valve peak gradient measures 13.1 mmHg. Aortic valve area, by VTI measures 2.25 cm.  Pulmonic Valve: The pulmonic valve was not well visualized. Pulmonic valve regurgitation is not visualized.  Aorta: The aortic root is normal in size and structure and the ascending aorta was not well visualized.  Venous: The inferior vena cava is dilated in size with greater than 50% respiratory variability, suggesting right atrial pressure of 8 mmHg.  IAS/Shunts: The interatrial septum was not well visualized.   LEFT VENTRICLE PLAX 2D LVIDd:         4.50 cm   Diastology LVIDs:         4.00 cm   LV e' medial:    11.30 cm/s LV PW:         1.10 cm   LV E/e' medial:  7.9 LV IVS:        0.90 cm   LV e' lateral:  12.20 cm/s LVOT diam:     1.90 cm   LV E/e' lateral: 7.3 LV SV:         67 LV SV Index:   38 LVOT Area:     2.84 cm   RIGHT VENTRICLE RV Basal diam:  3.00 cm RV Mid diam:    2.20 cm RV S prime:     15.30 cm/s TAPSE (M-mode): 2.7 cm  LEFT ATRIUM             Index        RIGHT ATRIUM           Index LA diam:        3.70 cm 2.09 cm/m   RA Area:     12.20 cm LA Vol (A2C):   44.0 ml 24.84 ml/m  RA Volume:   26.80 ml  15.13 ml/m LA Vol (A4C):   40.2 ml 22.70 ml/m LA Biplane Vol: 43.3 ml 24.45 ml/m AORTIC VALVE AV Area (Vmax):    2.10 cm AV Area (Vmean):   2.04 cm AV Area (VTI):     2.25 cm AV Vmax:           181.00 cm/s AV Vmean:          121.000 cm/s AV VTI:            0.298 m AV Peak Grad:      13.1 mmHg AV Mean Grad:      7.0 mmHg LVOT Vmax:         134.00 cm/s LVOT Vmean:        87.100 cm/s LVOT VTI:          0.236 m LVOT/AV VTI ratio: 0.79  AORTA Ao Root diam: 2.70 cm  MITRAL VALVE MV Area (PHT): 4.21 cm    SHUNTS MV Decel Time: 180 msec    Systemic VTI:  0.24 m MV E velocity: 88.80 cm/s  Systemic Diam: 1.90 cm MV A velocity: 71.30  cm/s MV E/A ratio:  1.25  Sunit Tolia Electronically signed by Madonna Large Signature Date/Time: 03/09/2023/6:36:16 PM    Final    MONITORS  LONG TERM MONITOR (3-14 DAYS) 07/23/2023  Narrative Patch Wear Time:  13 days and 23 hours (2025-02-15T13:25:44-0500 to 2025-03-01T13:25:36-0500)  Patient had a min HR of 41 bpm (sinus bradycardia), max HR of 133 bpm (supraventricular tachycardia), and avg HR of 64 bpm (normal sinus rhythm). Predominant underlying rhythm was Sinus Rhythm. 2 Supraventricular Tachycardia runs occurred, the run with the fastest interval lasting 8 beats (4.1 second duration) with a max rate of 133 bpm, the longest lasting 12 beats (6.2 second duration) with an avg rate of 119 bpm. Isolated SVEs were rare (<1.0%), SVE Couplets were rare (<1.0%), and no SVE Triplets were present. Isolated VEs were occasional (1.4%, 17711), and no VE Couplets or VE Triplets were present. Ventricular Bigeminy and Trigeminy were present.  Impression: Brief supraventricular tachycardia detected (2 episodes in 14 days; longest duration 6.2 seconds). Occasional PVCs (1.4% burden).  Darryle T. Barbaraann, MD, [facility]  [facility] [address] [place], [place] [address] [phone] 7:42 AM       ______________________________________________________________________________________________       Current Reported Medications:.    No outpatient medications have been marked as taking for the 12/14/23 encounter (Appointment) with Zanovia Rotz D, NP.    Physical Exam:    VS:  There were no vitals taken for this visit.   Wt Readings from Last  3 Encounters:  06/25/23 181 lb 12.8 oz (82.5 kg)  03/27/23 173 lb (78.5 kg)  03/12/23 177 lb 14.6 oz (80.7 kg)    GEN: Well nourished, well developed in no acute distress NECK: No JVD; No carotid bruits CARDIAC: ***RRR, no murmurs, rubs, gallops RESPIRATORY:  Clear to auscultation without rales, wheezing or rhonchi   ABDOMEN: Soft, non-tender, non-distended EXTREMITIES:  No edema; No acute deformity     Asessement and Plan:.     ***     Disposition: F/u with ***  Signed, Tequan Redmon D Marvis Bakken, NP

## 2023-12-13 DIAGNOSIS — M810 Age-related osteoporosis without current pathological fracture: Secondary | ICD-10-CM | POA: Diagnosis not present

## 2023-12-13 DIAGNOSIS — E785 Hyperlipidemia, unspecified: Secondary | ICD-10-CM | POA: Diagnosis not present

## 2023-12-13 DIAGNOSIS — N1831 Chronic kidney disease, stage 3a: Secondary | ICD-10-CM | POA: Diagnosis not present

## 2023-12-13 DIAGNOSIS — I10 Essential (primary) hypertension: Secondary | ICD-10-CM | POA: Diagnosis not present

## 2023-12-14 ENCOUNTER — Ambulatory Visit: Attending: Cardiology | Admitting: Cardiology

## 2023-12-14 DIAGNOSIS — I480 Paroxysmal atrial fibrillation: Secondary | ICD-10-CM

## 2023-12-14 DIAGNOSIS — E785 Hyperlipidemia, unspecified: Secondary | ICD-10-CM

## 2023-12-14 DIAGNOSIS — I10 Essential (primary) hypertension: Secondary | ICD-10-CM

## 2023-12-17 NOTE — Telephone Encounter (Signed)
 Left message for pt to call.

## 2023-12-25 ENCOUNTER — Ambulatory Visit: Admitting: Physician Assistant

## 2023-12-27 ENCOUNTER — Encounter: Payer: Self-pay | Admitting: Cardiology

## 2023-12-27 ENCOUNTER — Telehealth: Payer: Self-pay | Admitting: Cardiology

## 2023-12-27 NOTE — Telephone Encounter (Signed)
 Patient contacted 3x with no success, will send letter.

## 2023-12-27 NOTE — Telephone Encounter (Signed)
-----   Message from Kurtis R sent at 12/14/2023 11:02 AM EDT ----- Please reach out to this patient - she arrived 30 minutes late because of going to the old location and resulted in a no show.  Pleasee call to get her back on with Katlyn West for her 6 month f/u scheduled ASAP.  Thank you.

## 2024-01-08 ENCOUNTER — Ambulatory Visit: Admitting: Student

## 2024-01-13 DIAGNOSIS — E785 Hyperlipidemia, unspecified: Secondary | ICD-10-CM | POA: Diagnosis not present

## 2024-01-13 DIAGNOSIS — N1831 Chronic kidney disease, stage 3a: Secondary | ICD-10-CM | POA: Diagnosis not present

## 2024-01-13 DIAGNOSIS — M810 Age-related osteoporosis without current pathological fracture: Secondary | ICD-10-CM | POA: Diagnosis not present

## 2024-01-13 DIAGNOSIS — I10 Essential (primary) hypertension: Secondary | ICD-10-CM | POA: Diagnosis not present

## 2024-01-15 DIAGNOSIS — N390 Urinary tract infection, site not specified: Secondary | ICD-10-CM | POA: Diagnosis not present

## 2024-01-15 DIAGNOSIS — B009 Herpesviral infection, unspecified: Secondary | ICD-10-CM | POA: Diagnosis not present

## 2024-01-22 NOTE — Progress Notes (Signed)
 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 female with a PMH of paroxysmal AF/flutter (on Eliquis ), HTN, HLD, TIA, GERD, emphysema presents today for follow-up.  [name] was initially followed by Dr. Mady and is currently followed by Dr. Decent for management of atrial fibrillation.  She completed an ischemic evaluation with a Myoview  in 2017 that showed no ischemia and was low risk.  She had a 2D echo completed in 02/2023 for atrial flutter that occurred secondary to sepsis and pneumonia. Echo showed LVEF of 60-65% with no regional wall motion abnormalities, normal RV, and no significant valvular disease. She was started on IV Heparin  and IV Diltiazem  and thankfully converted to sinus rhythm. She was transitioned to PO Diltiazem  and Eliquis .  She was seen in follow-up and continued to have intermittent palpitations and was started on metoprolol .  She was seen in follow-up on 06/25/2023 and reported brief episodes of heart racing that occurred after having a large [employer] drink.  She wore a 2-week ZIO that showed predominant sinus rhythm was advised to discontinue amiodarone .  She was given patient assistance forms for Eliquis  at that time and advised to follow-up if palpitations reoccurred with discontinuation of amiodarone .  She was seen by her PCP on 01/15/2024 for UTI and was treated with antibiotics.  During visit BP was stable at 133/77 and  heart rate was 82.   Patient denies chest pain, palpitations, dyspnea, PND, orthopnea, nausea, vomiting, dizziness, syncope, edema, weight gain, or early satiety.   Discussed the use of AI scribe software for clinical note transcription with the patient, who gave verbal consent to proceed.  History of Present Illness    ***Notes:   Review of Systems  Please see the history of present illness.    All other systems reviewed and are otherwise negative except as noted above.  Physical Exam    Wt Readings from Last 3 Encounters:  06/25/23 181 lb 12.8 oz (82.5 kg)  03/27/23 173 lb (78.5 kg)  03/12/23 177 lb 14.6 oz (80.7 kg)   CD:Uyzmz were no vitals filed for this visit.,There is no height or weight on file to calculate BMI. GEN: Well nourished, well developed in no acute distress Neck: No JVD; No carotid bruits Pulmonary: Clear to auscultation without rales, wheezing or rhonchi  Cardiovascular: Normal rate. Regular rhythm. Normal S1. Normal S2.   Murmurs: There is no murmur.  ABDOMEN: Soft, non-tender, non-distended EXTREMITIES:  No edema; No deformity   EKG/LABS/ Recent Cardiac Studies   ECG personally reviewed by me today - ***  Risk Assessment/Calculations:   {Does this patient have ATRIAL FIBRILLATION?:[phone]}      Lab Results  Component Value Date   WBC 8.6 03/12/2023   HGB 9.6 (L) 03/12/2023   HCT 28.2 (L) 03/12/2023   MCV 81.3 03/12/2023   PLT 234 03/12/2023   Lab Results  Component  Value Date   CREATININE 0.67 03/12/2023   BUN <5 (L) 03/12/2023   NA 131 (L) 03/12/2023   K 3.6 03/12/2023   CL 102 03/12/2023   CO2 21 (L) 03/12/2023   Lab Results  Component Value Date   CHOL 171 01/10/2017   HDL 36 (L) 01/10/2017   LDLCALC 83 01/10/2017   TRIG 262 (H) 01/10/2017   CHOLHDL 4.8 01/10/2017    Lab Results  Component Value Date   HGBA1C 5.9 (H) 03/09/2023   Assessment & Plan    Assessment and Plan Assessment & Plan     1.  Paroxysmal  AF  2.  Essential hypertension  3.  Hyperlipidemia  4.***      Disposition: Follow-up with Darryle ONEIDA Decent, MD or APP in *** months {Are you ordering a CV Procedure (e.g. stress test, cath, DCCV, TEE, etc)?   Press F2        :[id number]}   Signed, Wyn Raddle, Jackee Shove, NP 01/22/2024, 5:16 PM [facility]

## 2024-01-23 ENCOUNTER — Ambulatory Visit: Attending: Nurse Practitioner | Admitting: Nurse Practitioner

## 2024-01-23 ENCOUNTER — Ambulatory Visit

## 2024-01-23 ENCOUNTER — Encounter: Payer: Self-pay | Admitting: Nurse Practitioner

## 2024-01-23 ENCOUNTER — Telehealth: Payer: Self-pay

## 2024-01-23 VITALS — BP 122/70 | HR 71 | Ht 61.0 in | Wt 184.0 lb

## 2024-01-23 DIAGNOSIS — I480 Paroxysmal atrial fibrillation: Secondary | ICD-10-CM | POA: Diagnosis not present

## 2024-01-23 DIAGNOSIS — I871 Compression of vein: Secondary | ICD-10-CM

## 2024-01-23 DIAGNOSIS — E785 Hyperlipidemia, unspecified: Secondary | ICD-10-CM

## 2024-01-23 DIAGNOSIS — I10 Essential (primary) hypertension: Secondary | ICD-10-CM

## 2024-01-23 MED ORDER — APIXABAN 5 MG PO TABS: 5.0000 mg | ORAL_TABLET | Freq: Two times a day (BID) | ORAL | 0 refills | Status: AC

## 2024-01-23 NOTE — Patient Instructions (Addendum)
 Medication Instructions:  CAN take an additional tablet of Metoprolol  as needed for palpitations  *If you need a refill on your cardiac medications before your next appointment, please call your pharmacy*  Lab Work: None ordered If you have labs (blood work) drawn today and your tests are completely normal, you will receive your results only by: MyChart Message (if you have MyChart) OR A paper copy in the mail If you have any lab test that is abnormal or we need to change your treatment, we will call you to review the results.  Testing/Procedures: Your physician has requested that you have a carotid duplex. This test is an ultrasound of the carotid arteries in your neck. It looks at blood flow through these arteries that supply the brain with blood. Allow one hour for this exam. There are no restrictions or special instructions.  Your physician has requested that you have a lower extremity arterial exercise duplex. During this test, exercise and ultrasound are used to evaluate arterial blood flow in the legs. Allow one hour for this exam. There are no restrictions or special instructions.  ZIO XT- Long Term Monitor Instructions  Your physician has requested you wear a ZIO patch monitor for 14 days.  This is a single patch monitor. [employer] supplies one patch monitor per enrollment. Additional stickers are not available. Please do not apply patch if you will be having a Nuclear Stress Test,  Echocardiogram, Cardiac CT, MRI, or Chest Xray during the period you would be wearing the  monitor. The patch cannot be worn during these tests. You cannot remove and re-apply the  ZIO XT patch monitor.  Your ZIO patch monitor will be mailed 3 day USPS to your address on file. It may take 3-5 days  to receive your monitor after you have been enrolled.  Once you have received your monitor, please review the enclosed instructions. Your monitor  has already been registered assigning a specific monitor  serial # to you.  Billing and Patient Assistance Program Information  We have supplied [employer] with any of your insurance information on file for billing purposes. [employer] offers a sliding scale Patient Assistance Program for patients that do not have  insurance, or whose insurance does not completely cover the cost of the ZIO monitor.  You must apply for the Patient Assistance Program to qualify for this discounted rate.  To apply, please call [employer] at [phone], select option 4, select option 2, ask to apply for  Patient Assistance Program. Meredeth will ask your household income, and how many people  are in your household. They will quote your out-of-pocket cost based on that information.  [employer] will also be able to set up a 69-month, interest-free payment plan if needed.  Applying the monitor   Shave hair from upper left chest.  Hold abrader disc by orange tab. Rub abrader in 40 strokes over the upper left chest as  indicated in your monitor instructions.  Clean area with 4 enclosed alcohol pads. Let dry.  Apply patch as indicated in monitor instructions. Patch will be placed under collarbone on left  side of chest with arrow pointing upward.  Rub patch adhesive wings for 2 minutes. Remove white label marked 1. Remove the white  label marked 2. Rub patch adhesive wings for 2 additional minutes.  While looking in a mirror, press and release button in center of patch. A small green light will  flash 3-4 times. This will be your only indicator that the monitor has  been turned on.  Do not shower for the first 24 hours. You may shower after the first 24 hours.  Press the button if you feel a symptom. You will hear a small click. Record Date, Time and  Symptom in the Patient Logbook.  When you are ready to remove the patch, follow instructions on the last 2 pages of Patient  Logbook. Stick patch monitor onto the last page of Patient Logbook.  Place Patient Logbook in the blue  and white box. Use locking tab on box and tape box closed  securely. The blue and white box has prepaid postage on it. Please place it in the mailbox as  soon as possible. Your physician should have your test results approximately 7 days after the  monitor has been mailed back to [facility].  Call [facility] Customer Care at [phone] if you have questions regarding  your ZIO XT patch monitor. Call them immediately if you see an orange light blinking on your  monitor.  If your monitor falls off in less than 4 days, contact our Monitor department at [phone].  If your monitor becomes loose or falls off after 4 days call [employer] at [phone] for  suggestions on securing your monitor   Follow-Up: At [facility], you and your health needs are our priority.  As part of our continuing mission to provide you with exceptional heart care, our providers are all part of one team.  This team includes your primary Cardiologist (physician) and Advanced Practice Providers or APPs (Physician Assistants and Nurse Practitioners) who all work together to provide you with the care you need, when you need it.  Your next appointment:   6 month(s)  Provider:   Darryle ONEIDA Decent, MD    We recommend signing up for the patient portal called MyChart.  Sign up information is provided on this After Visit Summary.  MyChart is used to connect with patients for Virtual Visits (Telemedicine).  Patients are able to view lab/test results, encounter notes, upcoming appointments, etc.  Non-urgent messages can be sent to your provider as well.   To learn more about what you can do with MyChart, go to [email or web address].   Other Instructions

## 2024-01-23 NOTE — Telephone Encounter (Signed)
 Medication name/dosage: Samples List: Eliquis  5 mg  Administration instructions:   Reason for samples: Reason for samples: unable to afford medication  Ordering provider: Jackee Alberts, NP  *Once above information entered, route the phone encounter to CV DIV MAG ST SAMPLES and send Teams message to team member assigned to Samples for the day.  I will send a message to the pharmacy tech to start patient assistance.

## 2024-01-23 NOTE — Progress Notes (Unsigned)
Enrolled patient for a 14 day Zio XT monitor to be mailed to patients home   [place] to read

## 2024-01-24 ENCOUNTER — Telehealth: Payer: Self-pay

## 2024-01-24 ENCOUNTER — Other Ambulatory Visit (HOSPITAL_COMMUNITY): Payer: Self-pay

## 2024-01-24 NOTE — Telephone Encounter (Signed)
 Staff onsite again on 01/25/24 to mail [name]  PAP application

## 2024-01-25 NOTE — Telephone Encounter (Signed)
 Application mailed.

## 2024-02-12 DIAGNOSIS — M810 Age-related osteoporosis without current pathological fracture: Secondary | ICD-10-CM | POA: Diagnosis not present

## 2024-02-12 DIAGNOSIS — I10 Essential (primary) hypertension: Secondary | ICD-10-CM | POA: Diagnosis not present

## 2024-02-12 DIAGNOSIS — N1831 Chronic kidney disease, stage 3a: Secondary | ICD-10-CM | POA: Diagnosis not present

## 2024-02-12 DIAGNOSIS — E785 Hyperlipidemia, unspecified: Secondary | ICD-10-CM | POA: Diagnosis not present

## 2024-02-14 DIAGNOSIS — I480 Paroxysmal atrial fibrillation: Secondary | ICD-10-CM | POA: Diagnosis not present

## 2024-02-17 DIAGNOSIS — I480 Paroxysmal atrial fibrillation: Secondary | ICD-10-CM | POA: Diagnosis not present

## 2024-02-17 DIAGNOSIS — I10 Essential (primary) hypertension: Secondary | ICD-10-CM

## 2024-02-17 DIAGNOSIS — E785 Hyperlipidemia, unspecified: Secondary | ICD-10-CM

## 2024-02-18 ENCOUNTER — Ambulatory Visit: Payer: Self-pay | Admitting: Nurse Practitioner

## 2024-02-18 ENCOUNTER — Ambulatory Visit: Admitting: Cardiology

## 2024-02-18 NOTE — Telephone Encounter (Signed)
 2ND ATTEMPT PAP: Patient assistance application for Eliquis  through [employer] has been mailed to pt's home address on file. Provider portion of application will be faxed to provider's office once pt portion has been received.

## 2024-02-19 ENCOUNTER — Other Ambulatory Visit: Payer: Self-pay | Admitting: Cardiovascular Disease

## 2024-02-19 ENCOUNTER — Ambulatory Visit (HOSPITAL_COMMUNITY)
Admission: RE | Admit: 2024-02-19 | Discharge: 2024-02-19 | Disposition: A | Discharge status: Home / Self Care | Source: Ambulatory Visit | Attending: Nurse Practitioner | Admitting: Nurse Practitioner

## 2024-02-19 ENCOUNTER — Ambulatory Visit (HOSPITAL_COMMUNITY)
Admission: RE | Admit: 2024-02-19 | Discharge: 2024-02-19 | Disposition: A | Source: Ambulatory Visit | Attending: Nurse Practitioner | Admitting: Nurse Practitioner

## 2024-02-19 DIAGNOSIS — E785 Hyperlipidemia, unspecified: Secondary | ICD-10-CM | POA: Diagnosis not present

## 2024-02-19 DIAGNOSIS — I480 Paroxysmal atrial fibrillation: Secondary | ICD-10-CM | POA: Diagnosis not present

## 2024-02-19 DIAGNOSIS — I871 Compression of vein: Secondary | ICD-10-CM

## 2024-02-19 DIAGNOSIS — I2510 Atherosclerotic heart disease of native coronary artery without angina pectoris: Secondary | ICD-10-CM | POA: Diagnosis not present

## 2024-02-19 DIAGNOSIS — I10 Essential (primary) hypertension: Secondary | ICD-10-CM | POA: Insufficient documentation

## 2024-02-19 DIAGNOSIS — R0989 Other specified symptoms and signs involving the circulatory and respiratory systems: Secondary | ICD-10-CM

## 2024-02-20 NOTE — Telephone Encounter (Signed)
 Prescription refill request for Eliquis  received. Indication: PAF Last office visit: 01/23/24  FORBES Alberts NP Scr: 0.67 on 03/12/23  Epic Age: 68 Weight: 83.5kg  Based on above findings Eliquis  5mg  twice daily is the appropriate dose.  Refill approved.

## 2024-03-05 NOTE — Telephone Encounter (Addendum)
 Unable to obtain requested information from patient after multiple attempts. Closing encounter.

## 2024-03-07 DIAGNOSIS — N390 Urinary tract infection, site not specified: Secondary | ICD-10-CM | POA: Diagnosis not present

## 2024-03-07 DIAGNOSIS — R399 Unspecified symptoms and signs involving the genitourinary system: Secondary | ICD-10-CM | POA: Diagnosis not present

## 2024-03-14 DIAGNOSIS — M810 Age-related osteoporosis without current pathological fracture: Secondary | ICD-10-CM | POA: Diagnosis not present

## 2024-03-14 DIAGNOSIS — E785 Hyperlipidemia, unspecified: Secondary | ICD-10-CM | POA: Diagnosis not present

## 2024-03-14 DIAGNOSIS — I10 Essential (primary) hypertension: Secondary | ICD-10-CM | POA: Diagnosis not present

## 2024-03-14 DIAGNOSIS — N1831 Chronic kidney disease, stage 3a: Secondary | ICD-10-CM | POA: Diagnosis not present

## 2024-04-13 DIAGNOSIS — E785 Hyperlipidemia, unspecified: Secondary | ICD-10-CM | POA: Diagnosis not present

## 2024-04-13 DIAGNOSIS — I10 Essential (primary) hypertension: Secondary | ICD-10-CM | POA: Diagnosis not present

## 2024-04-13 DIAGNOSIS — N1831 Chronic kidney disease, stage 3a: Secondary | ICD-10-CM | POA: Diagnosis not present

## 2024-04-13 DIAGNOSIS — M810 Age-related osteoporosis without current pathological fracture: Secondary | ICD-10-CM | POA: Diagnosis not present

## 2024-04-15 DIAGNOSIS — K219 Gastro-esophageal reflux disease without esophagitis: Secondary | ICD-10-CM | POA: Diagnosis not present

## 2024-04-15 DIAGNOSIS — K5900 Constipation, unspecified: Secondary | ICD-10-CM | POA: Diagnosis not present

## 2024-04-22 LAB — LAB REPORT - SCANNED: EGFR: 57

## 2024-04-30 DIAGNOSIS — Z1231 Encounter for screening mammogram for malignant neoplasm of breast: Secondary | ICD-10-CM | POA: Diagnosis not present

## 2024-05-02 DIAGNOSIS — R399 Unspecified symptoms and signs involving the genitourinary system: Secondary | ICD-10-CM | POA: Diagnosis not present
